# Patient Record
Sex: Female | Born: 1940 | Hispanic: No | Smoking: Former smoker
Health system: Southern US, Community
[De-identification: ages and names within clinical notes are randomized; demographics above are authoritative.]

## PROBLEM LIST (undated history)

## (undated) DIAGNOSIS — I10 Essential (primary) hypertension: Secondary | ICD-10-CM

## (undated) DIAGNOSIS — Z889 Allergy status to unspecified drugs, medicaments and biological substances status: Secondary | ICD-10-CM

## (undated) DIAGNOSIS — M1990 Unspecified osteoarthritis, unspecified site: Secondary | ICD-10-CM

## (undated) DIAGNOSIS — F419 Anxiety disorder, unspecified: Secondary | ICD-10-CM

## (undated) DIAGNOSIS — K219 Gastro-esophageal reflux disease without esophagitis: Secondary | ICD-10-CM

## (undated) DIAGNOSIS — I639 Cerebral infarction, unspecified: Secondary | ICD-10-CM

## (undated) HISTORY — PX: TONSILLECTOMY: SUR1361

## (undated) HISTORY — PX: ROTATOR CUFF REPAIR: SHX139

## (undated) HISTORY — PX: ABDOMINAL HYSTERECTOMY: SHX81

## (undated) HISTORY — PX: INTRAOCULAR LENS INSERTION: SHX110

## (undated) HISTORY — PX: JOINT REPLACEMENT: SHX530

## (undated) SURGERY — Surgical Case
Anesthesia: *Unknown

---

## 1998-02-17 ENCOUNTER — Other Ambulatory Visit: Admission: RE | Admit: 1998-02-17 | Discharge: 1998-02-17 | Payer: Self-pay | Admitting: Family Medicine

## 2000-03-07 ENCOUNTER — Ambulatory Visit (HOSPITAL_COMMUNITY): Admission: RE | Admit: 2000-03-07 | Discharge: 2000-03-07 | Payer: Self-pay | Admitting: Internal Medicine

## 2000-03-07 ENCOUNTER — Encounter: Payer: Self-pay | Admitting: Internal Medicine

## 2000-03-27 ENCOUNTER — Ambulatory Visit (HOSPITAL_COMMUNITY): Admission: RE | Admit: 2000-03-27 | Discharge: 2000-03-27 | Payer: Self-pay | Admitting: Gastroenterology

## 2000-03-27 ENCOUNTER — Encounter (INDEPENDENT_AMBULATORY_CARE_PROVIDER_SITE_OTHER): Payer: Self-pay

## 2000-04-17 ENCOUNTER — Encounter: Admission: RE | Admit: 2000-04-17 | Discharge: 2000-04-17 | Payer: Self-pay | Admitting: Gastroenterology

## 2000-04-17 ENCOUNTER — Encounter: Payer: Self-pay | Admitting: Gastroenterology

## 2002-08-08 ENCOUNTER — Encounter: Payer: Self-pay | Admitting: Internal Medicine

## 2002-08-08 ENCOUNTER — Ambulatory Visit (HOSPITAL_COMMUNITY): Admission: RE | Admit: 2002-08-08 | Discharge: 2002-08-08 | Payer: Self-pay | Admitting: Internal Medicine

## 2003-01-22 ENCOUNTER — Emergency Department (HOSPITAL_COMMUNITY): Admission: EM | Admit: 2003-01-22 | Discharge: 2003-01-22 | Payer: Self-pay | Admitting: Emergency Medicine

## 2003-01-22 ENCOUNTER — Encounter: Payer: Self-pay | Admitting: Emergency Medicine

## 2003-05-01 ENCOUNTER — Ambulatory Visit (HOSPITAL_COMMUNITY): Admission: RE | Admit: 2003-05-01 | Discharge: 2003-05-01 | Payer: Self-pay | Admitting: Gastroenterology

## 2003-05-01 ENCOUNTER — Encounter (INDEPENDENT_AMBULATORY_CARE_PROVIDER_SITE_OTHER): Payer: Self-pay | Admitting: Specialist

## 2003-09-30 ENCOUNTER — Emergency Department (HOSPITAL_COMMUNITY): Admission: EM | Admit: 2003-09-30 | Discharge: 2003-09-30 | Payer: Self-pay | Admitting: Emergency Medicine

## 2003-10-15 ENCOUNTER — Emergency Department (HOSPITAL_COMMUNITY): Admission: EM | Admit: 2003-10-15 | Discharge: 2003-10-16 | Payer: Self-pay | Admitting: Emergency Medicine

## 2004-05-11 ENCOUNTER — Encounter: Admission: RE | Admit: 2004-05-11 | Discharge: 2004-08-09 | Payer: Self-pay | Admitting: Orthopedic Surgery

## 2004-07-20 ENCOUNTER — Ambulatory Visit: Payer: Self-pay | Admitting: Family Medicine

## 2004-08-10 ENCOUNTER — Encounter: Admission: RE | Admit: 2004-08-10 | Discharge: 2004-11-08 | Payer: Self-pay | Admitting: Orthopedic Surgery

## 2004-09-28 ENCOUNTER — Ambulatory Visit: Payer: Self-pay | Admitting: Family Medicine

## 2004-11-04 ENCOUNTER — Ambulatory Visit: Payer: Self-pay | Admitting: Internal Medicine

## 2004-12-14 ENCOUNTER — Emergency Department (HOSPITAL_COMMUNITY): Admission: EM | Admit: 2004-12-14 | Discharge: 2004-12-14 | Payer: Self-pay | Admitting: Family Medicine

## 2004-12-17 ENCOUNTER — Ambulatory Visit: Payer: Self-pay | Admitting: Family Medicine

## 2005-02-07 ENCOUNTER — Encounter: Admission: RE | Admit: 2005-02-07 | Discharge: 2005-02-07 | Payer: Self-pay | Admitting: Gastroenterology

## 2005-02-21 ENCOUNTER — Encounter: Admission: RE | Admit: 2005-02-21 | Discharge: 2005-02-21 | Payer: Self-pay | Admitting: Gastroenterology

## 2005-04-07 ENCOUNTER — Ambulatory Visit: Payer: Self-pay | Admitting: Internal Medicine

## 2005-05-05 ENCOUNTER — Ambulatory Visit: Admission: RE | Admit: 2005-05-05 | Discharge: 2005-05-05 | Payer: Self-pay | Admitting: Gastroenterology

## 2005-05-24 ENCOUNTER — Encounter: Admission: RE | Admit: 2005-05-24 | Discharge: 2005-05-24 | Payer: Self-pay | Admitting: Gastroenterology

## 2005-09-07 ENCOUNTER — Ambulatory Visit: Payer: Self-pay | Admitting: Family Medicine

## 2005-09-08 ENCOUNTER — Ambulatory Visit: Payer: Self-pay | Admitting: Family Medicine

## 2005-11-02 ENCOUNTER — Inpatient Hospital Stay (HOSPITAL_COMMUNITY): Admission: AD | Admit: 2005-11-02 | Discharge: 2005-11-03 | Payer: Self-pay | Admitting: Internal Medicine

## 2005-12-06 ENCOUNTER — Inpatient Hospital Stay (HOSPITAL_COMMUNITY): Admission: EM | Admit: 2005-12-06 | Discharge: 2005-12-07 | Payer: Self-pay | Admitting: Internal Medicine

## 2006-06-20 ENCOUNTER — Ambulatory Visit (HOSPITAL_COMMUNITY): Admission: RE | Admit: 2006-06-20 | Discharge: 2006-06-20 | Payer: Self-pay | Admitting: Gastroenterology

## 2007-05-20 ENCOUNTER — Emergency Department (HOSPITAL_COMMUNITY): Admission: EM | Admit: 2007-05-20 | Discharge: 2007-05-20 | Payer: Self-pay | Admitting: Family Medicine

## 2007-06-11 ENCOUNTER — Encounter: Admission: RE | Admit: 2007-06-11 | Discharge: 2007-06-11 | Payer: Self-pay | Admitting: Sports Medicine

## 2008-05-02 ENCOUNTER — Encounter: Admission: RE | Admit: 2008-05-02 | Discharge: 2008-05-02 | Payer: Self-pay | Admitting: Gastroenterology

## 2008-10-31 ENCOUNTER — Ambulatory Visit (HOSPITAL_COMMUNITY): Admission: RE | Admit: 2008-10-31 | Discharge: 2008-10-31 | Payer: Self-pay | Admitting: Orthopedic Surgery

## 2009-12-02 ENCOUNTER — Encounter: Admission: RE | Admit: 2009-12-02 | Discharge: 2009-12-02 | Payer: Self-pay | Admitting: Gastroenterology

## 2010-04-04 ENCOUNTER — Inpatient Hospital Stay (HOSPITAL_COMMUNITY): Admission: EM | Admit: 2010-04-04 | Discharge: 2010-04-06 | Payer: Self-pay | Admitting: Emergency Medicine

## 2010-05-31 ENCOUNTER — Inpatient Hospital Stay (HOSPITAL_COMMUNITY): Admission: RE | Admit: 2010-05-31 | Discharge: 2010-06-03 | Payer: Self-pay | Admitting: Orthopedic Surgery

## 2010-10-10 ENCOUNTER — Encounter: Payer: Self-pay | Admitting: Internal Medicine

## 2010-10-11 ENCOUNTER — Encounter: Payer: Self-pay | Admitting: Gastroenterology

## 2010-12-02 LAB — CBC
HCT: 25.7 % — ABNORMAL LOW (ref 36.0–46.0)
HCT: 29.8 % — ABNORMAL LOW (ref 36.0–46.0)
HCT: 39.9 % (ref 36.0–46.0)
Hemoglobin: 8.6 g/dL — ABNORMAL LOW (ref 12.0–15.0)
Hemoglobin: 13.3 g/dL (ref 12.0–15.0)
MCH: 27.7 pg (ref 26.0–34.0)
MCV: 82.6 fL (ref 78.0–100.0)
MCV: 83.9 fL (ref 78.0–100.0)
Platelets: 167 10*3/uL (ref 150–400)
Platelets: 171 10*3/uL (ref 150–400)
Platelets: 234 10*3/uL (ref 150–400)
RBC: 3.11 MIL/uL — ABNORMAL LOW (ref 3.87–5.11)
RBC: 3.56 MIL/uL — ABNORMAL LOW (ref 3.87–5.11)
RBC: 3.84 MIL/uL — ABNORMAL LOW (ref 3.87–5.11)
RDW: 13.9 % (ref 11.5–15.5)
RDW: 13.9 % (ref 11.5–15.5)
WBC: 8.5 10*3/uL (ref 4.0–10.5)
WBC: 6.5 10*3/uL (ref 4.0–10.5)
WBC: 7.7 10*3/uL (ref 4.0–10.5)

## 2010-12-02 LAB — BASIC METABOLIC PANEL
BUN: 15 mg/dL (ref 6–23)
BUN: 12 mg/dL (ref 6–23)
BUN: 18 mg/dL (ref 6–23)
CO2: 29 mEq/L (ref 19–32)
Calcium: 8.8 mg/dL (ref 8.4–10.5)
Calcium: 8.6 mg/dL (ref 8.4–10.5)
Chloride: 106 mEq/L (ref 96–112)
Creatinine, Ser: 0.95 mg/dL (ref 0.4–1.2)
Creatinine, Ser: 0.75 mg/dL (ref 0.4–1.2)
GFR calc Af Amer: >60 mL/min (ref >60)
GFR calc Af Amer: >60 mL/min (ref >60)
GFR calc non Af Amer: >60 mL/min (ref >60)
GFR calc non Af Amer: >60 mL/min (ref >60)
Glucose, Bld: 119 mg/dL — ABNORMAL HIGH (ref 70–99)
Potassium: 4.5 mEq/L (ref 3.5–5.1)
Sodium: 132 mEq/L — ABNORMAL LOW (ref 135–145)

## 2010-12-02 LAB — DIFFERENTIAL
Basophils Relative: 0 % (ref 0–1)
Eosinophils Relative: 2 % (ref 0–5)
Lymphs Abs: 2 10*3/uL (ref 0.7–4.0)
Monocytes Absolute: 0.5 10*3/uL (ref 0.1–1.0)
Monocytes Relative: 10 % (ref 3–12)
Neutro Abs: 2.8 10*3/uL (ref 1.7–7.7)

## 2010-12-02 LAB — URINALYSIS, ROUTINE W REFLEX MICROSCOPIC
Bilirubin Urine: NEGATIVE
Glucose, UA: NEGATIVE mg/dL
Nitrite: NEGATIVE
Urobilinogen, UA: 1 mg/dL (ref 0.0–1.0)
pH: 6.5 (ref 5.0–8.0)

## 2010-12-02 LAB — COMPREHENSIVE METABOLIC PANEL
AST: 20 U/L (ref 0–37)
Albumin: 4 g/dL (ref 3.5–5.2)
Alkaline Phosphatase: 58 U/L (ref 39–117)
Calcium: 9.7 mg/dL (ref 8.4–10.5)
Chloride: 108 mEq/L (ref 96–112)
Sodium: 141 mEq/L (ref 135–145)
Total Bilirubin: 0.7 mg/dL (ref 0.3–1.2)

## 2010-12-02 LAB — URINE CULTURE

## 2010-12-02 LAB — PROTIME-INR
INR: 0.97 (ref 0.00–1.49)
Prothrombin Time: 13.1 seconds (ref 11.6–15.2)

## 2010-12-02 LAB — TYPE AND SCREEN
ABO/RH(D): O POS
Antibody Screen: NEGATIVE

## 2010-12-02 LAB — SURGICAL PCR SCREEN: MRSA, PCR: POSITIVE — AB

## 2010-12-04 LAB — COMPREHENSIVE METABOLIC PANEL
AST: 18 U/L (ref 0–37)
Albumin: 3.8 g/dL (ref 3.5–5.2)
Alkaline Phosphatase: 60 U/L (ref 39–117)
BUN: 26 mg/dL — ABNORMAL HIGH (ref 6–23)
BUN: 9 mg/dL (ref 6–23)
CO2: 26 mEq/L (ref 19–32)
Calcium: 9.1 mg/dL (ref 8.4–10.5)
Chloride: 109 mEq/L (ref 96–112)
Creatinine, Ser: 0.71 mg/dL (ref 0.4–1.2)
Creatinine, Ser: 0.78 mg/dL (ref 0.4–1.2)
GFR calc Af Amer: >60 mL/min (ref >60)
GFR calc non Af Amer: >60 mL/min (ref >60)
Glucose, Bld: 100 mg/dL — ABNORMAL HIGH (ref 70–99)
Glucose, Bld: 111 mg/dL — ABNORMAL HIGH (ref 70–99)
Sodium: 138 mEq/L (ref 135–145)
Total Bilirubin: 0.6 mg/dL (ref 0.3–1.2)
Total Protein: 6.9 g/dL (ref 6.0–8.3)

## 2010-12-04 LAB — GLUCOSE, CAPILLARY
Glucose-Capillary: 104 mg/dL — ABNORMAL HIGH (ref 70–99)
Glucose-Capillary: 108 mg/dL — ABNORMAL HIGH (ref 70–99)
Glucose-Capillary: 128 mg/dL — ABNORMAL HIGH (ref 70–99)
Glucose-Capillary: 82 mg/dL (ref 70–99)
Glucose-Capillary: 89 mg/dL (ref 70–99)
Glucose-Capillary: 89 mg/dL (ref 70–99)
Glucose-Capillary: 91 mg/dL (ref 70–99)

## 2010-12-04 LAB — CBC
HCT: 33.5 % — ABNORMAL LOW (ref 36.0–46.0)
HCT: 37.2 % (ref 36.0–46.0)
HCT: 37.5 % (ref 36.0–46.0)
HCT: 38.5 % (ref 36.0–46.0)
Hemoglobin: 10.9 g/dL — ABNORMAL LOW (ref 12.0–15.0)
Hemoglobin: 11.2 g/dL — ABNORMAL LOW (ref 12.0–15.0)
Hemoglobin: 12.4 g/dL (ref 12.0–15.0)
Hemoglobin: 12.6 g/dL (ref 12.0–15.0)
Hemoglobin: 12.8 g/dL (ref 12.0–15.0)
Hemoglobin: 13.1 g/dL (ref 12.0–15.0)
MCH: 28.3 pg (ref 26.0–34.0)
MCH: 28.4 pg (ref 26.0–34.0)
MCH: 28.9 pg (ref 26.0–34.0)
MCH: 29.1 pg (ref 26.0–34.0)
MCH: 29.1 pg (ref 26.0–34.0)
MCH: 29.2 pg (ref 26.0–34.0)
MCHC: 32.7 g/dL (ref 30.0–36.0)
MCHC: 32.8 g/dL (ref 30.0–36.0)
MCHC: 33.3 g/dL (ref 30.0–36.0)
MCHC: 33.4 g/dL (ref 30.0–36.0)
MCHC: 33.5 g/dL (ref 30.0–36.0)
MCV: 86.3 fL (ref 78.0–100.0)
MCV: 86.5 fL (ref 78.0–100.0)
MCV: 86.6 fL (ref 78.0–100.0)
MCV: 86.8 fL (ref 78.0–100.0)
MCV: 87.4 fL (ref 78.0–100.0)
Platelets: 183 K/uL (ref 150–400)
Platelets: 184 K/uL (ref 150–400)
RBC: 3.86 MIL/uL — ABNORMAL LOW (ref 3.87–5.11)
RBC: 3.94 MIL/uL (ref 3.87–5.11)
RBC: 4.3 MIL/uL (ref 3.87–5.11)
RBC: 4.33 MIL/uL (ref 3.87–5.11)
RBC: 4.38 MIL/uL (ref 3.87–5.11)
RBC: 4.49 MIL/uL (ref 3.87–5.11)
RDW: 13.9 % (ref 11.5–15.5)
RDW: 14 % (ref 11.5–15.5)
WBC: 7.1 K/uL (ref 4.0–10.5)
WBC: 7.6 K/uL (ref 4.0–10.5)

## 2010-12-04 LAB — BASIC METABOLIC PANEL
CO2: 25 mEq/L (ref 19–32)
Chloride: 109 mEq/L (ref 96–112)
GFR calc Af Amer: >60 mL/min (ref >60)
Glucose, Bld: 101 mg/dL — ABNORMAL HIGH (ref 70–99)
Sodium: 141 mEq/L (ref 135–145)

## 2010-12-04 LAB — TYPE AND SCREEN
ABO/RH(D): O POS
Antibody Screen: NEGATIVE

## 2010-12-04 LAB — APTT: aPTT: 29 seconds (ref 24–37)

## 2010-12-04 LAB — DIFFERENTIAL
Basophils Absolute: 0 K/uL (ref 0.0–0.1)
Basophils Relative: 1 % (ref 0–1)
Eosinophils Absolute: 0.1 K/uL (ref 0.0–0.7)
Eosinophils Relative: 2 % (ref 0–5)
Lymphocytes Relative: 23 % (ref 12–46)
Lymphs Abs: 1.7 K/uL (ref 0.7–4.0)
Monocytes Absolute: 0.6 K/uL (ref 0.1–1.0)
Monocytes Relative: 8 % (ref 3–12)
Neutro Abs: 4.7 K/uL (ref 1.7–7.7)
Neutrophils Relative %: 66 % (ref 43–77)

## 2010-12-04 LAB — PROTIME-INR
INR: 0.94 (ref 0.00–1.49)
Prothrombin Time: 12.5 s (ref 11.6–15.2)

## 2010-12-04 LAB — ABO/RH: ABO/RH(D): O POS

## 2010-12-04 LAB — CLOSTRIDIUM DIFFICILE EIA: C difficile Toxins A+B, EIA: NEGATIVE

## 2010-12-04 LAB — HEMOCCULT GUIAC POC 1CARD (OFFICE): Fecal Occult Bld: POSITIVE

## 2011-02-04 NOTE — Procedures (Signed)
[facility]  Patient:    Doris Pacheco, Doris Pacheco                         MRN: [id number] Proc. Date: 03/27/00 Attending:  Petra Kuba, M.D. CC:         Petra Kuba, M.D.             Crista Luria, M.D.                           Procedure Report  PROCEDURE PERFORMED:  Colonoscopy with hot biopsy.  ENDOSCOPIST:  Petra Kuba, M.D.  INDICATIONS FOR PROCEDURE:  Patient with family history of colon cancer and personal history of colon polyps, due for repeat colonic screening.  Consent was signed after risks, benefits, methods, and options were thoroughly discussed multiple times in the past.  MEDICATIONS USED:  Demerol 60 mg, Versed 6 mg.  DESCRIPTION OF PROCEDURE:  Rectal inspection was pertinent for external hemorrhoids.  Digital exam was negative.  The video colonoscope was inserted and fairly easily advanced around the colon to the cecum.  This did require some abdominal pressure but no position changes.  The cecum was identified by the appendiceal orifice and the ileocecal valve.  No obvious abnormality was seen on insertion.  In the cecum along the fold a 5 mm polyp was seen and was hot biopsied x 2.  An additional cold biopsy x 2 was obtained.  This was on a setting of 20/20.  The scope was slowly withdrawn.  In the ascending and sigmoid, two tiny polyps were seen, both were hot biopsied on a setting of 20/20 and put in a second separate container.  The one in the ascending was proximal and the one in the sigmoid was proximal as well in those locations. No other abnormalities were seen as we slowly withdrew back through the colon. Once back in the rectum, the scope was retroflexed revealing some internal hemorrhoids, small.  The scope was straightened, air was suctioned, scope removed.  The patient tolerated the procedure well.  There was no obvious immediate complication.  ENDOSCOPIC DIAGNOSIS: 1. Internal and external hemorrhoids. 2. Two tiny to  small proximal sigmoid and proximal ascending polyps, both    hot biopsied, put in the second container. 3. Cecal 5 mm polyp, hot biopsied x 2, cold x 2, put in the first container. 4. Otherwise, within normal limits.  PLAN:  Await pathology to determine future colonic screening.  Otherwise, be happy to see back p.r.n. and return care to [facility] for the customary yearly rectals and guaiacs and other health care maintenance. DD:  03/27/00 TD:  03/27/00 Job: [id number]

## 2011-02-04 NOTE — Discharge Summary (Signed)
Doris Pacheco, Doris Pacheco                 ACCOUNT NO.:  [id number]   MEDICAL RECORD NO.:  [id number]          PATIENT TYPE:  INP   LOCATION:  6713                         FACILITY:  [facility]   PHYSICIAN:  Della Goo, M.D. DATE OF BIRTH:  [birth year]   DATE OF ADMISSION:  11/02/2005  DATE OF DISCHARGE:  11/03/2005                                 DISCHARGE SUMMARY   FINAL DIAGNOSES:  1.  Hyperamylasemia (elevated amylase level).  2.  Hiatal hernia/epigastric pain.  3.  Gastroesophageal reflux disease.  4.  Hypertension.  5.  Hyperlipidemia.  6.  Lumbar disk disease, compressed lumbar disk at L4 and L5, history.   HOSPITAL COURSE:  A 70 year old female who was referred for admission  secondary to complaints of epigastric abdominal pain extending across the  abdomen, radiating toward the back, and found on examination to have mild  epigastric tenderness without rebound or guarding.  The patient also had no  complaints of nausea, vomiting or bowel changes.  Outpatient labs had been  ordered, results of which returned on November 02, 2005, with  critical/abnormal values of amylase at a level of 514.  The patient was then  referred for direct admit and further evaluation.   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  There were  no findings of acute pancreatitis.  The gallbladder appeared normal at that  time.  CT scan results of the pelvis revealed findings of a hysterectomy,  surgical changes and a small simple-appearing left ovarian cyst.  On  admission, the patient had an amylase level of 514 and a lipase level of 16.  The patient was treated with IV fluids for rehydration therapy, proton pump  inhibitor therapy IV.  Repeat of laboratory studies the next a.m. revealed  an amylase level which had decreased to 351.   Otherwise, her labs revealed  mild anemia with a hemoglobin of 11.6 but a normocytic, normochromic MCV  level of 82.9.  The patient was without symptoms and was discharged to home  after our CT scan results were reviewed.  She was to follow up in the office  in one week for further studies and differentiation of her amylase levels.  The patient resumed her previous medications of Diovan, Maxzide, Lipitor,  Premarin, vitamin C, vitamin E, and Nexium.           ______________________________  Della Goo, M.D.     HJ/MEDQ  D:  12/06/2005  T:  12/08/2005  Job:  [id number]

## 2011-02-04 NOTE — Op Note (Signed)
NAME:  Doris Pacheco, Doris Pacheco                           ACCOUNT NO.:  [id number]   MEDICAL RECORD NO.:  [id number]                   PATIENT TYPE:  AMB   LOCATION:  ENDO                                 FACILITY:  [facility]   PHYSICIAN:  Petra Kuba, M.D.                 DATE OF BIRTH:  12-16-1940   DATE OF PROCEDURE:  05/01/2003  DATE OF DISCHARGE:                                 OPERATIVE REPORT   PROCEDURE:  Colonoscopy with polypectomy.   INDICATION:  Patient with a history of colon polyps, due for repeat  screening.  Consent was signed after risks, benefits, methods, options  thoroughly discussed multiple times in the past.   MEDICINES USED:  1. Demerol 60.  2. Versed 6.   DESCRIPTION OF PROCEDURE:  Rectal inspection was pertinent for small  external hemorrhoids.  Digital exam was negative.  The video pediatric  adjustable colonoscope was inserted, fairly easily advanced around the colon  to the cecum, did require some abdominal pressure.  No abnormality was seen  on insertion.  The cecum was identified by the appendiceal orifice and the  ileocecal valve.  The scope was slowly withdrawn.  The prep was adequate.  There was some liquid stool that required washing and suctioning.  In the  more proximal ascending one fold away from the ileocecal valve, was a tiny  polyp with was hot biopsied x 1 at a setting of 20-20.  In reevaluating her  cecum, possibly there was some residual polyp from the previous polyp seen  and on a setting of 15-15, one hot biopsy was done, and that was put in the  second container.  On slow withdrawal, two other tiny to small ascending and  hepatic flexure polyps were seen and were hot biopsied and put in the first  container.  The scope was further withdrawn.  No other transverse or left-  sided polyps were seen.  Anorectal pull-through and retroflexion confirmed  some small hemorrhoids.  The scope reinserted a short ways up the left of  the colon; air was  suctioned, scope removed.  The patient tolerated the  procedure well.  There was no obvious immediate complication.   ENDOSCOPIC DIAGNOSES:  1. Internal-external hemorrhoids.  2. Multiple small to tiny right-sided polyps, hot biopsied with one of the     cecum put in a separate container and done on a setting 15-15.  The other     was on a setting of 20-20.  3. Otherwise within normal limits to the cecum.   PLAN:  1. Await pathology to determine future colonic screening.  2. Happy to see back p.r.n.  3. Otherwise, return care to Dr. Margarette Canada and [facility] for the customary     health care maintenance to include yearly rectals and guaiacs.  Petra Kuba, M.D.   MEM/MEDQ  D:  05/01/2003  T:  05/01/2003  Job:  [id number]   cc:   Dr. Margarette Canada

## 2011-02-04 NOTE — Op Note (Signed)
Doris Pacheco, Doris Pacheco                 ACCOUNT NO.:  [id number]   MEDICAL RECORD NO.:  [id number]          PATIENT TYPE:  AMB   LOCATION:  DFTL                         FACILITY:  [facility]   PHYSICIAN:  Petra Kuba, M.D.    DATE OF BIRTH:  1941-04-24   DATE OF PROCEDURE:  05/05/2005  DATE OF DISCHARGE:                                 OPERATIVE REPORT   PROCEDURE:  Esophagogastroduodenoscopy.   ENDOSCOPIST:  Petra Kuba, M.D.   INDICATIONS:  Abdominal pain, abnormal CAT scan, reflux.   Consent was signed after risks, benefits, methods, and options were  thoroughly discussed in the office.   MEDICINES USED:  Demerol 60, Versed 5.   DESCRIPTION OF PROCEDURE:  First, the video endoscope was inserted by direct  vision. The esophagus was normal.  In the distal esophagus was a tiny small  hiatal hernia. The scope passed into the stomach. advanced to the antrum,  pertinent for some minimal antritis, advanced through a normal pylorus into  a normal duodenal bulb and around the C-loop to a normal second portion of  the duodenum. We thought we got a quick look at the ampulla which appeared  normal. The scope was slowly withdrawn back to the bulb.  A good look there  confirmed its normal appearance.  The scope was withdrawn back to the  stomach and retroflexed.  High in the cardia, the hiatal hernia was  confirmed.  The fundus, angularis, lesser and greater curve were all normal  on retroflexed visualization except for a minimal amount of gastritis.  Straight visualization of the stomach did not reveal any additional  findings. The scope was then slowly withdrawn. Again, a good look at the  esophagus was normal. The scope was removed, and we went ahead and inserted  the side-viewing diagnostic ERCP scope which was easily advanced into the  stomach, through the antrum, and into the duodenum. We thought we got a  quick look at the minor ampulla which appeared normal and a good look at the  major  papilla was normal. Photo documentation was obtained. The scope was  then withdrawn. The patient tolerated the procedure well. There was no  obvious immediate complication.   ENDOSCOPIC DIAGNOSES:  1.  Tiny small hiatal hernia.  2.  Minimal gastritis/antritis.  3.  Otherwise normal esophagogastroduodenoscopy with good look at the major      papilla with the side-viewing scope. I believe we may have seen the      minor papilla as well.   PLAN:  1.  Continue pump inhibitors.  2.  Follow up p.r.n.  3.  Recheck MRI in September to make sure no changes and decide followup at      that juncture.  4.  Continue to deal with the insurance company over her Nexium.  We will      see what pump inhibitors they cover.           ______________________________  Petra Kuba, M.D.     MEM/MEDQ  D:  05/05/2005  T:  05/05/2005  Job:  [id number]  cc:   Tresa Endo L. Philipp Deputy, M.D.  [phone] [address]  [place] [address]  Fax: [phone]

## 2012-06-18 ENCOUNTER — Other Ambulatory Visit: Payer: Self-pay | Admitting: Gastroenterology

## 2012-10-18 ENCOUNTER — Encounter (HOSPITAL_COMMUNITY): Payer: Self-pay | Admitting: Pharmacy Technician

## 2012-10-18 ENCOUNTER — Other Ambulatory Visit: Payer: Self-pay | Admitting: Orthopedic Surgery

## 2012-10-18 NOTE — H&P (Signed)
TOTAL KNEE REVISION ADMISSION H&P  Patient is being admitted for right revision total knee arthroplasty.  Subjective:  Chief Complaint:right knee pain.  HPI: Doris Pacheco, 72 y.o. female, has a history of pain and functional disability in the right knee(s) due to failed previous arthroplasty and patient has failed non-surgical conservative treatments for greater than 12 weeks to include NSAID's and/or analgesics and activity modification. The indications for the revision of the total knee arthroplasty are fracture or mechanical failure of one or components. Onset of symptoms was abrupt starting 1 years ago with gradually worsening course since that time.  Prior procedures on the right knee(s) include arthroplasty.  Patient currently rates pain in the right knee(s) at 7 out of 10 with activity. There is worsening of pain with activity and weight bearing and pain that interferes with activities of daily living.  Patient has evidence of periprosthetic patella fracture by imaging studies. This condition presents safety issues increasing the risk of falls.  There is no current active infection.  There are no active problems to display for this patient.  No past medical history on file.  No past surgical history on file.  No prescriptions prior to admission   Allergies not on file  History  Substance Use Topics  . Smoking status: Not on file  . Smokeless tobacco: Not on file  . Alcohol Use: Not on file    No family history on file.    Review of Systems  Constitutional: Negative.   HENT: Negative.   Eyes: Negative.   Respiratory: Negative.   Cardiovascular: Negative.   Gastrointestinal: Negative.   Genitourinary: Negative.   Musculoskeletal: Positive for joint pain.  Skin: Negative.   Neurological: Negative.   Endo/Heme/Allergies: Bruises/bleeds easily.  Psychiatric/Behavioral: Negative.      Objective:  Physical Exam  Constitutional: She is oriented to person, place, and time.  She appears well-developed and well-nourished.  HENT:  Head: Normocephalic.  Eyes: Pupils are equal, round, and reactive to light.  Cardiovascular: Normal heart sounds.   Respiratory: Breath sounds normal.  GI: Bowel sounds are normal.  Musculoskeletal:       Right knee: tenderness found. Patellar tendon tenderness noted.  Neurological: She is alert and oriented to person, place, and time.  Psychiatric: Her behavior is normal.    Vital signs in last 24 hours:    Labs:  There is no height or weight on file to calculate BMI.  Imaging Review Plain radiographs of the right knee demonstrate overall alignment is neutral.There is evidence of loosening of the patellar component. The bone quality appears to be adequate for age and reported activity level.  Assessment/Plan:  End stage arthritis, right knee(s) with failed previous arthroplasty.   The patient history, physical examination, clinical judgment of the provider and imaging studies are consistent with end stage degenerative joint disease of the right knee(s), previous total knee arthroplasty. Revision total knee arthroplasty is deemed medically necessary. The treatment options including medical management, injection therapy, arthroscopy and revision arthroplasty were discussed at length. The risks and benefits of revision total knee arthroplasty were presented and reviewed. The risks due to aseptic loosening, infection, stiffness, patella tracking problems, thromboembolic complications and other imponderables were discussed. The patient acknowledged the explanation, agreed to proceed with the plan and consent was signed. Patient is being admitted for inpatient treatment for surgery, pain control, PT, OT, prophylactic antibiotics, VTE prophylaxis, progressive ambulation and ADL's and discharge planning.The patient is planning to be discharged home with home health  services

## 2012-10-23 ENCOUNTER — Encounter (HOSPITAL_COMMUNITY)
Admission: RE | Admit: 2012-10-23 | Discharge: 2012-10-23 | Disposition: A | Discharge status: Home / Self Care | Payer: Medicare Other | Source: Ambulatory Visit | Attending: Orthopedic Surgery | Admitting: Orthopedic Surgery

## 2012-10-23 ENCOUNTER — Ambulatory Visit (HOSPITAL_COMMUNITY)
Admission: RE | Admit: 2012-10-23 | Discharge: 2012-10-23 | Disposition: A | Discharge status: Home / Self Care | Payer: Medicare Other | Source: Ambulatory Visit | Attending: Orthopedic Surgery | Admitting: Orthopedic Surgery

## 2012-10-23 ENCOUNTER — Encounter (HOSPITAL_COMMUNITY): Payer: Self-pay

## 2012-10-23 HISTORY — DX: Essential (primary) hypertension: I10

## 2012-10-23 HISTORY — DX: Unspecified osteoarthritis, unspecified site: M19.90

## 2012-10-23 HISTORY — DX: Cerebral infarction, unspecified: I63.9

## 2012-10-23 HISTORY — DX: Allergy status to unspecified drugs, medicaments and biological substances: Z88.9

## 2012-10-23 HISTORY — DX: Gastro-esophageal reflux disease without esophagitis: K21.9

## 2012-10-23 HISTORY — DX: Anxiety disorder, unspecified: F41.9

## 2012-10-23 LAB — CBC WITH DIFFERENTIAL/PLATELET
Eosinophils Absolute: 0.1 10*3/uL (ref 0.0–0.7)
Eosinophils Relative: 1 % (ref 0–5)
Hemoglobin: 13.1 g/dL (ref 12.0–15.0)
Lymphs Abs: 1.4 10*3/uL (ref 0.7–4.0)
MCH: 28.1 pg (ref 26.0–34.0)
MCV: 84.8 fL (ref 78.0–100.0)
Monocytes Relative: 10 % (ref 3–12)
Neutrophils Relative %: 66 % (ref 43–77)
RBC: 4.66 MIL/uL (ref 3.87–5.11)

## 2012-10-23 LAB — BASIC METABOLIC PANEL
CO2: 26 mEq/L (ref 19–32)
Chloride: 103 mEq/L (ref 96–112)
Glucose, Bld: 88 mg/dL (ref 70–99)
Potassium: 3.7 mEq/L (ref 3.5–5.1)
Sodium: 139 mEq/L (ref 135–145)

## 2012-10-23 LAB — URINALYSIS, ROUTINE W REFLEX MICROSCOPIC
Ketones, ur: NEGATIVE mg/dL
Leukocytes, UA: NEGATIVE
Nitrite: NEGATIVE
Protein, ur: NEGATIVE mg/dL
pH: 5.5 (ref 5.0–8.0)

## 2012-10-23 LAB — TYPE AND SCREEN

## 2012-10-23 LAB — PROTIME-INR: INR: 0.94 (ref 0.00–1.49)

## 2012-10-23 LAB — URINE MICROSCOPIC-ADD ON

## 2012-10-23 NOTE — Progress Notes (Signed)
Primary Physician - Dr. Lovell Sheehan, [place] Cardiologist - dr. Katrinka [facility]) Stress test several years ago, will request records No other cardiac testing.

## 2012-10-23 NOTE — Pre-Procedure Instructions (Signed)
FAWNA CRANMER  10/23/2012   Your procedure is scheduled on:  Friday, February 7th  Report to [facility] at 0530 AM.  Call this number if you have problems the morning of surgery: [phone]   Remember:   Do not eat food or drink liquids after midnight.    Take these medicines the morning of surgery with A SIP OF WATER: nexium, lorcet if needed   Do not wear jewelry, make-up or nail polish.  Do not wear lotions, powders, or perfumes. You may not wear deodorant.  Do not shave 48 hours prior to surgery. Men may shave face and neck.  Do not bring valuables to the hospital.  Contacts, dentures or bridgework may not be worn into surgery.  Leave suitcase in the car. After surgery it may be brought to your room.  For patients admitted to the hospital, checkout time is 11:00 AM the day of discharge.   Patients discharged the day of surgery will not be allowed to drive home.    Special Instructions: Shower using CHG 2 nights before surgery and the night before surgery.  If you shower the day of surgery use CHG.  Use special wash - you have one bottle of CHG for all showers.  You should use approximately 1/3 of the bottle for each shower.   Please read over the following fact sheets that you were given: Pain Booklet, Coughing and Deep Breathing, Blood Transfusion Information, MRSA Information and Surgical Site Infection Prevention

## 2012-10-25 MED ORDER — CEFAZOLIN SODIUM-DEXTROSE 2-3 GM-% IV SOLR
2.0000 g | INTRAVENOUS | Status: AC
  Administered 2012-10-26: 2 g via INTRAVENOUS
  Filled 2012-10-25: qty 50

## 2012-10-25 NOTE — H&P (Signed)
TOTAL KNEE REVISION ADMISSION H&P  Patient is being admitted for right revision total knee arthroplasty.  Subjective:  Chief Complaint:right knee pain.  HPI: Doris Pacheco, 72 y.o. female, has a history of pain and functional disability in the right knee(s) due to arthritis and patient has failed non-surgical conservative treatments for greater than 12 weeks to include NSAID's and/or analgesics and use of assistive devices. The indications for the revision of the total knee arthroplasty are loosening of one or more components and treatment of periprosthectic fracture of distal femur, proximal tibia or patella. Onset of symptoms was abrupt starting 1 years ago with gradually worsening course since that time.  Prior procedures on the right knee(s) include arthroplasty.  Patient currently rates pain in the right knee(s) at 7 out of 10 with activity. There is worsening of pain with activity and weight bearing and pain that interferes with activities of daily living.  Patient has evidence of prosthetic loosening and patella fracture by imaging studies. This condition presents safety issues increasing the risk of falls.  There is no current active infection.  There are no active problems to display for this patient.  Past Medical History  Diagnosis Date  . Hypertension     takes meds daily  . Anxiety   . History of seasonal allergies   . Stroke     tia  . GERD (gastroesophageal reflux disease)   . Arthritis     Past Surgical History  Procedure Date  . Joint replacement     left hip, right knee  . Rotator cuff repair     bilateral  . Tonsillectomy   . Abdominal hysterectomy   . Intraocular lens insertion     right eye    No prescriptions prior to admission   Allergies  Allergen Reactions  . Tape Rash    History  Substance Use Topics  . Smoking status: Never Smoker   . Smokeless tobacco: Not on file  . Alcohol Use: No    No family history on file.    Review of Systems   Constitutional: Negative.   HENT: Negative.   Eyes: Negative.   Respiratory: Negative.   Cardiovascular: Negative.   Gastrointestinal: Negative.   Genitourinary: Negative.   Musculoskeletal: Positive for joint pain and falls.  Skin: Negative.   Neurological: Negative.   Endo/Heme/Allergies: Bruises/bleeds easily.  Psychiatric/Behavioral: Negative.      Objective:  Physical Exam  Constitutional: She is oriented to person, place, and time. She appears well-developed and well-nourished.  Eyes: Pupils are equal, round, and reactive to light.  Cardiovascular: Normal heart sounds.   Respiratory: Breath sounds normal.  GI: Bowel sounds are normal.  Musculoskeletal:       Right knee: tenderness found.  Neurological: She is alert and oriented to person, place, and time.    Vital signs in last 24 hours:    Labs:  There is no height or weight on file to calculate BMI.  Imaging Review Plain radiographs of the right knee(s). The overall alignment is neutral.There is evidence of loosening of the patellar components. The bone quality appears to be adequate for age and reported activity level. There is a comminuted periprosthetic fracture with diastasis.  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 Revision total knee arthroplasty  is deemed medically necessary. The treatment options including medical management, injection therapy, arthroscopy and revision arthroplasty were discussed at length. The risks and benefits of revision total knee arthroplasty were presented and reviewed. The risks due to aseptic loosening, infection, stiffness, patella tracking problems, thromboembolic complications and other  imponderables were discussed. The patient acknowledged the explanation, agreed to proceed with the plan and consent was signed. Patient is being admitted for inpatient treatment for surgery, pain control, PT, OT, prophylactic antibiotics, VTE prophylaxis, progressive ambulation and ADL's and discharge planning.The patient is planning to be discharged home with home health services

## 2012-10-26 ENCOUNTER — Encounter (HOSPITAL_COMMUNITY): Payer: Self-pay | Admitting: Anesthesiology

## 2012-10-26 ENCOUNTER — Encounter (HOSPITAL_COMMUNITY): Payer: Self-pay | Admitting: Orthopedic Surgery

## 2012-10-26 ENCOUNTER — Encounter (HOSPITAL_COMMUNITY)
Admission: RE | Disposition: A | Discharge status: Home Health | Payer: Self-pay | Source: Ambulatory Visit | Attending: Orthopedic Surgery

## 2012-10-26 ENCOUNTER — Inpatient Hospital Stay (HOSPITAL_COMMUNITY)
Admission: RE | Admit: 2012-10-26 | Discharge: 2012-10-28 | DRG: 488 | Disposition: A | Discharge status: Home Health | Payer: Medicare Other | Source: Ambulatory Visit | Attending: Orthopedic Surgery | Admitting: Orthopedic Surgery

## 2012-10-26 ENCOUNTER — Inpatient Hospital Stay (HOSPITAL_COMMUNITY): Payer: Medicare Other

## 2012-10-26 ENCOUNTER — Inpatient Hospital Stay (HOSPITAL_COMMUNITY): Payer: Medicare Other | Admitting: Anesthesiology

## 2012-10-26 DIAGNOSIS — S82009A Unspecified fracture of unspecified patella, initial encounter for closed fracture: Secondary | ICD-10-CM | POA: Diagnosis present

## 2012-10-26 DIAGNOSIS — F411 Generalized anxiety disorder: Secondary | ICD-10-CM | POA: Diagnosis present

## 2012-10-26 DIAGNOSIS — Z8673 Personal history of transient ischemic attack (TIA), and cerebral infarction without residual deficits: Secondary | ICD-10-CM

## 2012-10-26 DIAGNOSIS — T84038A Mechanical loosening of other internal prosthetic joint, initial encounter: Secondary | ICD-10-CM

## 2012-10-26 DIAGNOSIS — Z01818 Encounter for other preprocedural examination: Secondary | ICD-10-CM

## 2012-10-26 DIAGNOSIS — Z01812 Encounter for preprocedural laboratory examination: Secondary | ICD-10-CM

## 2012-10-26 DIAGNOSIS — I10 Essential (primary) hypertension: Secondary | ICD-10-CM | POA: Diagnosis present

## 2012-10-26 DIAGNOSIS — T84039A Mechanical loosening of unspecified internal prosthetic joint, initial encounter: Principal | ICD-10-CM | POA: Diagnosis present

## 2012-10-26 DIAGNOSIS — K219 Gastro-esophageal reflux disease without esophagitis: Secondary | ICD-10-CM | POA: Diagnosis present

## 2012-10-26 DIAGNOSIS — Y831 Surgical operation with implant of artificial internal device as the cause of abnormal reaction of the patient, or of later complication, without mention of misadventure at the time of the procedure: Secondary | ICD-10-CM | POA: Diagnosis present

## 2012-10-26 DIAGNOSIS — T84069A Wear of articular bearing surface of unspecified internal prosthetic joint, initial encounter: Secondary | ICD-10-CM | POA: Diagnosis present

## 2012-10-26 DIAGNOSIS — Z96659 Presence of unspecified artificial knee joint: Secondary | ICD-10-CM

## 2012-10-26 DIAGNOSIS — W19XXXA Unspecified fall, initial encounter: Secondary | ICD-10-CM | POA: Diagnosis present

## 2012-10-26 DIAGNOSIS — Y9289 Other specified places as the place of occurrence of the external cause: Secondary | ICD-10-CM

## 2012-10-26 DIAGNOSIS — M129 Arthropathy, unspecified: Secondary | ICD-10-CM | POA: Diagnosis present

## 2012-10-26 DIAGNOSIS — Z01810 Encounter for preprocedural cardiovascular examination: Secondary | ICD-10-CM

## 2012-10-26 HISTORY — PX: PATELLECTOMY: SHX1022

## 2012-10-26 HISTORY — PX: I & D KNEE WITH POLY EXCHANGE: SHX5024

## 2012-10-26 SURGERY — PATELLECTOMY
Anesthesia: Regional | Site: Knee | Laterality: Right | Wound class: Clean

## 2012-10-26 MED ORDER — HYDROMORPHONE HCL PF 1 MG/ML IJ SOLN
0.2500 mg | INTRAMUSCULAR | Status: DC | PRN
  Administered 2012-10-26 (×2): 0.5 mg via INTRAVENOUS

## 2012-10-26 MED ORDER — ONDANSETRON HCL 4 MG/2ML IJ SOLN
INTRAMUSCULAR | Status: DC | PRN
  Administered 2012-10-26: 4 mg via INTRAVENOUS

## 2012-10-26 MED ORDER — HYDROMORPHONE HCL PF 1 MG/ML IJ SOLN
1.0000 mg | INTRAMUSCULAR | Status: DC | PRN
  Administered 2012-10-27 – 2012-10-28 (×5): 1 mg via INTRAVENOUS
  Filled 2012-10-26 (×5): qty 1

## 2012-10-26 MED ORDER — FENTANYL CITRATE 0.05 MG/ML IJ SOLN
50.0000 ug | INTRAMUSCULAR | Status: DC | PRN
  Administered 2012-10-26 (×2): 50 ug via INTRAVENOUS

## 2012-10-26 MED ORDER — FENTANYL CITRATE 0.05 MG/ML IJ SOLN
INTRAMUSCULAR | Status: DC | PRN
  Administered 2012-10-26: 25 ug via INTRAVENOUS
  Administered 2012-10-26: 50 ug via INTRAVENOUS
  Administered 2012-10-26: 25 ug via INTRAVENOUS

## 2012-10-26 MED ORDER — EPHEDRINE SULFATE 50 MG/ML IJ SOLN
INTRAMUSCULAR | Status: DC | PRN
  Administered 2012-10-26 (×2): 10 mg via INTRAVENOUS

## 2012-10-26 MED ORDER — ACETAMINOPHEN 10 MG/ML IV SOLN: 1000.0000 mg | Freq: Once | INTRAVENOUS | Status: DC

## 2012-10-26 MED ORDER — KCL IN DEXTROSE-NACL 20-5-0.45 MEQ/L-%-% IV SOLN
INTRAVENOUS | Status: DC
  Administered 2012-10-26: 125 mL/h via INTRAVENOUS
  Filled 2012-10-26 (×9): qty 1000

## 2012-10-26 MED ORDER — MIDAZOLAM HCL 2 MG/2ML IJ SOLN: 1.0000 mg | INTRAMUSCULAR | Status: DC | PRN

## 2012-10-26 MED ORDER — ROPIVACAINE HCL 5 MG/ML IJ SOLN
INTRAMUSCULAR | Status: DC | PRN
  Administered 2012-10-26: 30 mL

## 2012-10-26 MED ORDER — METHOCARBAMOL 500 MG PO TABS
500.0000 mg | ORAL_TABLET | Freq: Four times a day (QID) | ORAL | Status: DC | PRN
  Administered 2012-10-27 – 2012-10-28 (×5): 500 mg via ORAL
  Filled 2012-10-26 (×5): qty 1

## 2012-10-26 MED ORDER — ACETAMINOPHEN 10 MG/ML IV SOLN
1000.0000 mg | Freq: Four times a day (QID) | INTRAVENOUS | Status: AC
  Administered 2012-10-26 (×3): 1000 mg via INTRAVENOUS
  Filled 2012-10-26 (×4): qty 100

## 2012-10-26 MED ORDER — PROMETHAZINE HCL 25 MG/ML IJ SOLN: 6.2500 mg | INTRAMUSCULAR | Status: DC | PRN

## 2012-10-26 MED ORDER — ACETAMINOPHEN 10 MG/ML IV SOLN
INTRAVENOUS | Status: AC
  Administered 2012-10-26: 1000 mg via INTRAVENOUS
  Filled 2012-10-26: qty 100

## 2012-10-26 MED ORDER — ACETAMINOPHEN 650 MG RE SUPP: 650.0000 mg | Freq: Four times a day (QID) | RECTAL | Status: DC | PRN

## 2012-10-26 MED ORDER — SODIUM CHLORIDE 0.9 % IR SOLN
Status: DC | PRN
  Administered 2012-10-26: 1000 mL

## 2012-10-26 MED ORDER — HYDROMORPHONE HCL PF 1 MG/ML IJ SOLN
INTRAMUSCULAR | Status: AC
  Filled 2012-10-26: qty 2

## 2012-10-26 MED ORDER — OXYCODONE HCL 5 MG PO TABS
5.0000 mg | ORAL_TABLET | ORAL | Status: DC | PRN
  Administered 2012-10-26 – 2012-10-28 (×8): 10 mg via ORAL
  Filled 2012-10-26 (×8): qty 2

## 2012-10-26 MED ORDER — URIBEL 118 MG PO CAPS
1.0000 | ORAL_CAPSULE | Freq: Every day | ORAL | Status: DC
  Administered 2012-10-26 – 2012-10-28 (×3): 118 mg via ORAL
  Filled 2012-10-26 (×3): qty 1

## 2012-10-26 MED ORDER — METOCLOPRAMIDE HCL 5 MG/ML IJ SOLN: 5.0000 mg | Freq: Three times a day (TID) | INTRAMUSCULAR | Status: DC | PRN

## 2012-10-26 MED ORDER — MONTELUKAST SODIUM 10 MG PO TABS
10.0000 mg | ORAL_TABLET | Freq: Every day | ORAL | Status: DC
  Administered 2012-10-26 – 2012-10-27 (×2): 10 mg via ORAL
  Filled 2012-10-26 (×4): qty 1

## 2012-10-26 MED ORDER — ONDANSETRON HCL 4 MG/2ML IJ SOLN: 4.0000 mg | Freq: Four times a day (QID) | INTRAMUSCULAR | Status: DC | PRN

## 2012-10-26 MED ORDER — OXYCODONE HCL 5 MG/5ML PO SOLN: 5.0000 mg | Freq: Once | ORAL | Status: DC | PRN

## 2012-10-26 MED ORDER — OXYCODONE-ACETAMINOPHEN 5-325 MG PO TABS: 1.0000 | ORAL_TABLET | ORAL | Status: DC | PRN

## 2012-10-26 MED ORDER — BISACODYL 5 MG PO TBEC: 5.0000 mg | DELAYED_RELEASE_TABLET | Freq: Every day | ORAL | Status: DC | PRN

## 2012-10-26 MED ORDER — PHENOL 1.4 % MT LIQD: 1.0000 | OROMUCOSAL | Status: DC | PRN

## 2012-10-26 MED ORDER — LIDOCAINE HCL (CARDIAC) 20 MG/ML IV SOLN
INTRAVENOUS | Status: DC | PRN
  Administered 2012-10-26: 40 mg via INTRAVENOUS

## 2012-10-26 MED ORDER — FLEET ENEMA 7-19 GM/118ML RE ENEM: 1.0000 | ENEMA | Freq: Once | RECTAL | Status: AC | PRN

## 2012-10-26 MED ORDER — METHOCARBAMOL 100 MG/ML IJ SOLN
500.0000 mg | Freq: Four times a day (QID) | INTRAVENOUS | Status: DC | PRN
  Filled 2012-10-26: qty 5

## 2012-10-26 MED ORDER — OXYCODONE HCL 5 MG PO TABS: 5.0000 mg | ORAL_TABLET | Freq: Once | ORAL | Status: DC | PRN

## 2012-10-26 MED ORDER — FENTANYL CITRATE 0.05 MG/ML IJ SOLN
INTRAMUSCULAR | Status: AC
  Filled 2012-10-26: qty 4

## 2012-10-26 MED ORDER — CELECOXIB 200 MG PO CAPS
200.0000 mg | ORAL_CAPSULE | Freq: Two times a day (BID) | ORAL | Status: DC
  Administered 2012-10-26 – 2012-10-28 (×4): 200 mg via ORAL
  Filled 2012-10-26 (×7): qty 1

## 2012-10-26 MED ORDER — ASPIRIN EC 325 MG PO TBEC
325.0000 mg | DELAYED_RELEASE_TABLET | Freq: Two times a day (BID) | ORAL | Status: DC
  Administered 2012-10-26 – 2012-10-28 (×4): 325 mg via ORAL
  Filled 2012-10-26 (×7): qty 1

## 2012-10-26 MED ORDER — MIDAZOLAM HCL 5 MG/5ML IJ SOLN
INTRAMUSCULAR | Status: DC | PRN
  Administered 2012-10-26: 2 mg via INTRAVENOUS

## 2012-10-26 MED ORDER — MAGNESIUM HYDROXIDE 400 MG/5ML PO SUSP: 30.0000 mL | Freq: Every day | ORAL | Status: DC | PRN

## 2012-10-26 MED ORDER — METOCLOPRAMIDE HCL 10 MG PO TABS: 5.0000 mg | ORAL_TABLET | Freq: Three times a day (TID) | ORAL | Status: DC | PRN

## 2012-10-26 MED ORDER — PHENYLEPHRINE HCL 10 MG/ML IJ SOLN
INTRAMUSCULAR | Status: DC | PRN
  Administered 2012-10-26 (×2): 80 ug via INTRAVENOUS
  Administered 2012-10-26: 40 ug via INTRAVENOUS
  Administered 2012-10-26: 80 ug via INTRAVENOUS

## 2012-10-26 MED ORDER — LACTATED RINGERS IV SOLN
INTRAVENOUS | Status: DC | PRN
  Administered 2012-10-26 (×2): via INTRAVENOUS

## 2012-10-26 MED ORDER — ASPIRIN 325 MG PO TBEC: 325.0000 mg | DELAYED_RELEASE_TABLET | Freq: Two times a day (BID) | ORAL | Status: DC

## 2012-10-26 MED ORDER — ALUM & MAG HYDROXIDE-SIMETH 200-200-20 MG/5ML PO SUSP: 30.0000 mL | ORAL | Status: DC | PRN

## 2012-10-26 MED ORDER — DEXTROSE-NACL 5-0.45 % IV SOLN: INTRAVENOUS | Status: DC

## 2012-10-26 MED ORDER — DIPHENHYDRAMINE HCL 12.5 MG/5ML PO ELIX: 12.5000 mg | ORAL_SOLUTION | ORAL | Status: DC | PRN

## 2012-10-26 MED ORDER — MEPERIDINE HCL 25 MG/ML IJ SOLN: 6.2500 mg | INTRAMUSCULAR | Status: DC | PRN

## 2012-10-26 MED ORDER — TRIAMTERENE-HCTZ 37.5-25 MG PO TABS
1.0000 | ORAL_TABLET | Freq: Every day | ORAL | Status: DC
  Administered 2012-10-27 – 2012-10-28 (×2): 1 via ORAL
  Filled 2012-10-26 (×4): qty 1

## 2012-10-26 MED ORDER — ACETAMINOPHEN 325 MG PO TABS: 650.0000 mg | ORAL_TABLET | Freq: Four times a day (QID) | ORAL | Status: DC | PRN

## 2012-10-26 MED ORDER — IRBESARTAN 300 MG PO TABS
300.0000 mg | ORAL_TABLET | Freq: Every day | ORAL | Status: DC
  Administered 2012-10-27 – 2012-10-28 (×2): 300 mg via ORAL
  Filled 2012-10-26 (×4): qty 1

## 2012-10-26 MED ORDER — PROPOFOL 10 MG/ML IV BOLUS
INTRAVENOUS | Status: DC | PRN
  Administered 2012-10-26: 200 mg via INTRAVENOUS

## 2012-10-26 MED ORDER — ZOLPIDEM TARTRATE 5 MG PO TABS: 5.0000 mg | ORAL_TABLET | Freq: Every evening | ORAL | Status: DC | PRN

## 2012-10-26 MED ORDER — MENTHOL 3 MG MT LOZG
1.0000 | LOZENGE | OROMUCOSAL | Status: DC | PRN
  Filled 2012-10-26 (×3): qty 9

## 2012-10-26 MED ORDER — ONDANSETRON HCL 4 MG PO TABS: 4.0000 mg | ORAL_TABLET | Freq: Four times a day (QID) | ORAL | Status: DC | PRN

## 2012-10-26 MED ORDER — FENTANYL CITRATE 0.05 MG/ML IJ SOLN
INTRAMUSCULAR | Status: AC
  Filled 2012-10-26: qty 2

## 2012-10-26 MED ORDER — PANTOPRAZOLE SODIUM 40 MG PO TBEC
80.0000 mg | DELAYED_RELEASE_TABLET | Freq: Every day | ORAL | Status: DC
  Administered 2012-10-26 – 2012-10-27 (×2): 80 mg via ORAL
  Filled 2012-10-26 (×2): qty 2

## 2012-10-26 MED ORDER — ACETAMINOPHEN 10 MG/ML IV SOLN: 1000.0000 mg | Freq: Once | INTRAVENOUS | Status: DC | PRN

## 2012-10-26 SURGICAL SUPPLY — 82 items
BANDAGE ELASTIC 4 VELCRO ST LF (GAUZE/BANDAGES/DRESSINGS) ×3 IMPLANT
BANDAGE ELASTIC 6 VELCRO ST LF (GAUZE/BANDAGES/DRESSINGS) ×3 IMPLANT
BANDAGE ESMARK 6X9 LF (GAUZE/BANDAGES/DRESSINGS) ×2 IMPLANT
BLADE SAG 18X100X1.27 (BLADE) ×3 IMPLANT
BLADE SAW SAG 90X13X1.27 (BLADE) ×3 IMPLANT
BLADE SURG ROTATE 9660 (MISCELLANEOUS) IMPLANT
BNDG CMPR 9X6 STRL LF SNTH (GAUZE/BANDAGES/DRESSINGS) ×2
BNDG COHESIVE 4X5 TAN STRL (GAUZE/BANDAGES/DRESSINGS) ×3 IMPLANT
BNDG ESMARK 6X9 LF (GAUZE/BANDAGES/DRESSINGS) ×3
BOWL SMART MIX CTS (DISPOSABLE) IMPLANT
CLOTH BEACON ORANGE TIMEOUT ST (SAFETY) ×3 IMPLANT
COVER BACK TABLE 24X17X13 BIG (DRAPES) IMPLANT
COVER MAYO STAND STRL (DRAPES) ×3 IMPLANT
COVER SURGICAL LIGHT HANDLE (MISCELLANEOUS) ×3 IMPLANT
CUFF TOURNIQUET SINGLE 34IN LL (TOURNIQUET CUFF) ×2 IMPLANT
CUFF TOURNIQUET SINGLE 44IN (TOURNIQUET CUFF) IMPLANT
DISC DIAMOND MED (BURR) IMPLANT
DRAPE EXTREMITY T 121X128X90 (DRAPE) ×3 IMPLANT
DRAPE INCISE IOBAN 66X45 STRL (DRAPES) ×3 IMPLANT
DRAPE U-SHAPE 47X51 STRL (DRAPES) ×3 IMPLANT
DRSG PAD ABDOMINAL 8X10 ST (GAUZE/BANDAGES/DRESSINGS) ×3 IMPLANT
DURAPREP 26ML APPLICATOR (WOUND CARE) ×3 IMPLANT
ELECT REM PT RETURN 9FT ADLT (ELECTROSURGICAL) ×3
ELECTRODE REM PT RTRN 9FT ADLT (ELECTROSURGICAL) ×2 IMPLANT
EVACUATOR 1/8 PVC DRAIN (DRAIN) ×1 IMPLANT
GAUZE XEROFORM 1X8 LF (GAUZE/BANDAGES/DRESSINGS) ×3 IMPLANT
GAUZE XEROFORM 5X9 LF (GAUZE/BANDAGES/DRESSINGS) ×2 IMPLANT
GLOVE BIO SURGEON STRL SZ7 (GLOVE) ×3 IMPLANT
GLOVE BIO SURGEON STRL SZ7.5 (GLOVE) ×3 IMPLANT
GLOVE BIOGEL PI IND STRL 7.0 (GLOVE) ×2 IMPLANT
GLOVE BIOGEL PI IND STRL 8 (GLOVE) ×2 IMPLANT
GLOVE BIOGEL PI INDICATOR 7.0 (GLOVE) ×1
GLOVE BIOGEL PI INDICATOR 8 (GLOVE) ×1
GLOVE SURG SS PI 7.5 STRL IVOR (GLOVE) ×2 IMPLANT
GOWN EXTRA PROTECTION XXL 0583 (GOWNS) ×1 IMPLANT
GOWN PREVENTION PLUS XLARGE (GOWN DISPOSABLE) ×3 IMPLANT
GOWN STRL NON-REIN LRG LVL3 (GOWN DISPOSABLE) ×6 IMPLANT
HANDPIECE INTERPULSE COAX TIP (DISPOSABLE) ×3
HOOD PEEL AWAY FACE SHEILD DIS (HOOD) ×7 IMPLANT
IMMOBILIZER KNEE 22 UNIV (SOFTGOODS) ×4 IMPLANT
KIT BASIN OR (CUSTOM PROCEDURE TRAY) ×3 IMPLANT
KIT ROOM TURNOVER OR (KITS) ×3 IMPLANT
MANIFOLD NEPTUNE II (INSTRUMENTS) ×3 IMPLANT
NEEDLE 22X1 1/2 (OR ONLY) (NEEDLE) IMPLANT
NS IRRIG 1000ML POUR BTL (IV SOLUTION) ×3 IMPLANT
PACK ORTHO EXTREMITY (CUSTOM PROCEDURE TRAY) ×3 IMPLANT
PACK TOTAL JOINT (CUSTOM PROCEDURE TRAY) ×3 IMPLANT
PAD ARMBOARD 7.5X6 YLW CONV (MISCELLANEOUS) ×6 IMPLANT
PAD CAST 4YDX4 CTTN HI CHSV (CAST SUPPLIES) ×3 IMPLANT
PADDING CAST COTTON 4X4 STRL (CAST SUPPLIES) ×3
PADDING CAST COTTON 6X4 STRL (CAST SUPPLIES) ×3 IMPLANT
PLATE ROT INSERT 12.5MM SIZE 3 (Plate) ×2 IMPLANT
RASP HELIOCORDIAL MED (MISCELLANEOUS) IMPLANT
RETRIEVER SUT HEWSON (MISCELLANEOUS) ×2 IMPLANT
SET HNDPC FAN SPRY TIP SCT (DISPOSABLE) ×1 IMPLANT
SPONGE GAUZE 4X4 12PLY (GAUZE/BANDAGES/DRESSINGS) ×4 IMPLANT
SPONGE LAP 18X18 X RAY DECT (DISPOSABLE) ×3 IMPLANT
STAPLER VISISTAT 35W (STAPLE) ×3 IMPLANT
STOCKINETTE IMPERVIOUS 9X36 MD (GAUZE/BANDAGES/DRESSINGS) ×3 IMPLANT
SUCTION FRAZIER TIP 10 FR DISP (SUCTIONS) ×3 IMPLANT
SUT ETHIBOND 2 V 37 (SUTURE) IMPLANT
SUT FIBERWIRE #2 38 REV NDL BL (SUTURE) ×6
SUT PDS AB 4-0 P3 18 (SUTURE) IMPLANT
SUT VIC AB 0 CT1 27 (SUTURE) ×3
SUT VIC AB 0 CT1 27XBRD ANBCTR (SUTURE) ×2 IMPLANT
SUT VIC AB 1 CT1 27 (SUTURE) ×3
SUT VIC AB 1 CT1 27XBRD ANBCTR (SUTURE) ×2 IMPLANT
SUT VIC AB 1 CTX 36 (SUTURE)
SUT VIC AB 1 CTX36XBRD ANBCTR (SUTURE) ×1 IMPLANT
SUT VIC AB 2-0 CT1 27 (SUTURE) ×3
SUT VIC AB 2-0 CT1 TAPERPNT 27 (SUTURE) ×2 IMPLANT
SUT VIC AB 2-0 CTB1 (SUTURE) ×1 IMPLANT
SUTURE FIBERWR#2 38 REV NDL BL (SUTURE) ×2 IMPLANT
SYR CONTROL 10ML LL (SYRINGE) IMPLANT
TOWEL OR 17X24 6PK STRL BLUE (TOWEL DISPOSABLE) ×3 IMPLANT
TOWEL OR 17X26 10 PK STRL BLUE (TOWEL DISPOSABLE) ×3 IMPLANT
TRAY FOLEY CATH 14FR (SET/KITS/TRAYS/PACK) ×2 IMPLANT
TUBE ANAEROBIC SPECIMEN COL (MISCELLANEOUS) ×3 IMPLANT
TUBE CONNECTING 12X1/4 (SUCTIONS) ×3 IMPLANT
UNDERPAD 30X30 INCONTINENT (UNDERPADS AND DIAPERS) ×1 IMPLANT
WATER STERILE IRR 1000ML POUR (IV SOLUTION) ×3 IMPLANT
YANKAUER SUCT BULB TIP NO VENT (SUCTIONS) ×3 IMPLANT

## 2012-10-26 NOTE — Evaluation (Signed)
Physical Therapy Evaluation Patient Details Name: Doris Pacheco MRN: [id number] DOB: 08-Jun-1941 Today's Date: 10/26/2012 Time: 1342-1410 PT Time Calculation (min): 28 min  PT Assessment / Plan / Recommendation Clinical Impression  Pt is a 72 y/o female s/p Patellectomy of R TKA.  Pt lives at home with support of her Daughter and Daughter -in-law. Pt should be safe to d/c to home with HHPT and support of family.  Acute to follow pt.     PT Assessment  Patient needs continued PT services    Follow Up Recommendations  Home health PT;Supervision - Intermittent    Does the patient have the potential to tolerate intense rehabilitation      Barriers to Discharge None NONE    Equipment Recommendations  None recommended by PT    Recommendations for Other Services     Frequency 7X/week    Precautions / Restrictions Precautions Precautions: Knee Precaution Comments: NO ROM IN RIGHT KNEE Required Braces or Orthoses: Knee Immobilizer - Right Knee Immobilizer - Right: On at all times Restrictions Weight Bearing Restrictions: Yes RLE Weight Bearing: Weight bearing as tolerated Other Position/Activity Restrictions: No ROM IN Right Knee   Pertinent Vitals/Pain 2-4/10 pain in knee.  Pt medicated prior to session.       Mobility  Bed Mobility Bed Mobility: Supine to Sit Supine to Sit: 4: Min assist;HOB flat;With rails Details for Bed Mobility Assistance: Assist for R LE secondary to pain and weakness from surgery.. Pt managed at least 90% Transfers Transfers: Sit to Stand;Stand to Sit Sit to Stand: 4: Min guard;From bed;With upper extremity assist Stand to Sit: 4: Min guard;To chair/3-in-1;With upper extremity assist Details for Transfer Assistance: Cues for safe technique.  Ambulation/Gait Ambulation/Gait Assistance: 5: Supervision Ambulation Distance (Feet): 10 Feet Assistive device: Rolling walker Ambulation/Gait Assistance Details: VCs for gait sequencing.  Gait Pattern:  Step-to pattern Stairs: No Wheelchair Mobility Wheelchair Mobility: No    Exercises     PT Diagnosis: Abnormality of gait;Generalized weakness;Acute pain  PT Problem List: Decreased strength;Decreased mobility;Pain PT Treatment Interventions: Gait training;Functional mobility training;Therapeutic activities;Therapeutic exercise;Patient/family education;Stair training   PT Goals Acute Rehab PT Goals PT Goal Formulation: With patient Time For Goal Achievement: 11/02/12 Potential to Achieve Goals: Good Pt will go Supine/Side to Sit: Independently;with HOB 0 degrees PT Goal: Supine/Side to Sit - Progress: Goal set today Pt will go Sit to Supine/Side: Independently;with HOB 0 degrees PT Goal: Sit to Supine/Side - Progress: Goal set today Pt will Transfer Bed to Chair/Chair to Bed: with modified independence PT Transfer Goal: Bed to Chair/Chair to Bed - Progress: Goal set today Pt will Ambulate: >150 feet;with rolling walker PT Goal: Ambulate - Progress: Goal set today Pt will Go Up / Down Stairs: 3-5 stairs;with modified independence;with least restrictive assistive device PT Goal: Up/Down Stairs - Progress: Goal set today Pt will Perform Home Exercise Program: Independently PT Goal: Perform Home Exercise Program - Progress: Goal set today  Visit Information  Last PT Received On: 10/26/12 Assistance Needed: +1    Subjective Data  Subjective: Agree to PT eval.  Patient Stated Goal: walk without pain.   Prior Functioning  Home Living Lives With: Alone Available Help at Discharge: Family;Available 24 hours/day Type of Home: House Home Access: Stairs to enter;Ramped entrance Entrance Stairs-Number of Steps: 3 Entrance Stairs-Rails: Right;Left;Can reach both Home Layout: One level Bathroom Shower/Tub: Walk-in Contractor: Handicapped height Bathroom Accessibility: Yes How Accessible: Accessible via walker Home Adaptive Equipment: Bedside  commode/3-in-1;Built-in shower seat;Shower chair  with back;Walker - rolling;Walker - standard;Straight cane Prior Function Level of Independence: Independent with assistive device(s) Able to Take Stairs?: Yes Driving: Yes Vocation: Retired Musician: No difficulties    Copywriter, advertising Overall Cognitive Status: Appears within functional limits for tasks assessed/performed Arousal/Alertness: Awake/alert Orientation Level: Appears intact for tasks assessed Behavior During Session: [facility] for tasks performed    Extremity/Trunk Assessment Right Upper Extremity Assessment RUE ROM/Strength/Tone: Within functional levels Left Upper Extremity Assessment LUE ROM/Strength/Tone: Within functional levels Right Lower Extremity Assessment RLE ROM/Strength/Tone: Unable to fully assess Left Lower Extremity Assessment LLE ROM/Strength/Tone: Within functional levels Trunk Assessment Trunk Assessment: Normal   Balance    End of Session PT - End of Session Equipment Utilized During Treatment: Gait belt;Right knee immobilizer Activity Tolerance: Patient tolerated treatment well Patient left: in chair;with call bell/phone within reach Nurse Communication: Mobility status  GP     Tramel Westbrook 10/26/2012, 3:15 PM  Daritza Brees L. Grayden Burley DPT [phone]

## 2012-10-26 NOTE — Progress Notes (Signed)
Orthopedic Tech Progress Note Patient Details:  Doris Pacheco 08/15/1941 [id number]  Patient ID: [name], female   DOB: [birth year], 72 y.o.   MRN: [id number] Trapeze bar patient helper  Nikki Dom 10/26/2012, 3:02 PM

## 2012-10-26 NOTE — Progress Notes (Signed)
UR COMPLETED  

## 2012-10-26 NOTE — Anesthesia Preprocedure Evaluation (Addendum)
Anesthesia Evaluation  Patient identified by MRN, date of birth, ID band Patient awake    Reviewed: Allergy & Precautions, H&P , NPO status , Patient's Chart, lab work & pertinent test results  Airway Mallampati: II      Dental  (+) Teeth Intact and Dental Advidsory Given   Pulmonary  breath sounds clear to auscultation  Pulmonary exam normal       Cardiovascular hypertension, On Medications Rhythm:Regular Rate:Normal     Neuro/Psych CVA, No Residual Symptoms    GI/Hepatic GERD-  Medicated and Controlled,  Endo/Other    Renal/GU      Musculoskeletal   Abdominal   Peds  Hematology   Anesthesia Other Findings   Reproductive/Obstetrics                         Anesthesia Physical Anesthesia Plan  ASA: III  Anesthesia Plan: General and Regional   Post-op Pain Management:    Induction: Inhalational  Airway Management Planned: LMA  Additional Equipment:   Intra-op Plan:   Post-operative Plan: Extubation in OR  Informed Consent: I have reviewed the patients History and Physical, chart, labs and discussed the procedure including the risks, benefits and alternatives for the proposed anesthesia with the patient or authorized representative who has indicated his/her understanding and acceptance.   Dental Advisory Given  Plan Discussed with: CRNA and Anesthesiologist  Anesthesia Plan Comments:      Anesthesia Quick Evaluation

## 2012-10-26 NOTE — Addendum Note (Signed)
Addendum  created 10/26/12 1121 by Gaylan Gerold, MD   Modules edited:Anesthesia Attestations

## 2012-10-26 NOTE — Transfer of Care (Signed)
Immediate Anesthesia Transfer of Care Note  Patient: Doris Pacheco  Procedure(s) Performed: Procedure(s) (LRB) with comments: PATELLECTOMY (Right) IRRIGATION AND DEBRIDEMENT KNEE WITH POLY EXCHANGE (Right) - poly exchange  Patient Location: PACU  Anesthesia Type:General  Level of Consciousness: awake and alert   Airway & Oxygen Therapy: Patient Spontanous Breathing and Patient connected to nasal cannula oxygen  Post-op Assessment: Report given to PACU RN, Post -op Vital signs reviewed and stable and Patient moving all extremities X 4  Post vital signs: Reviewed and stable  Complications: No apparent anesthesia complications

## 2012-10-26 NOTE — Interval H&P Note (Signed)
History and Physical Interval Note:  10/26/2012 7:13 AM  Doris Pacheco  has presented today for surgery, with the diagnosis of COMMINUTED RIGHT TOTAL KNEE ARTHROPLASTY PATELLA FRACTURE  The various methods of treatment have been discussed with the patient and family. After consideration of risks, benefits and other options for treatment, the patient has consented to  Procedure(s) (LRB) with comments: PATELLECTOMY (Right) TOTAL KNEE REVISION (Right) - REMOVE PATELLA PROSTHESIS STAND-BY TKA REVISION  as a surgical intervention .  The patient's history has been reviewed, patient examined, no change in status, stable for surgery.  I have reviewed the patient's chart and labs.  Questions were answered to the patient's satisfaction.     [name] J   

## 2012-10-26 NOTE — Addendum Note (Signed)
Addendum  created 10/26/12 1121 by Liberti Appleton R Neosha Switalski, MD   Modules edited:Anesthesia Attestations    

## 2012-10-26 NOTE — Progress Notes (Signed)
Called ortho for overhead trapeze, none available at this time.  Will continue to monitor.

## 2012-10-26 NOTE — Preoperative (Signed)
Beta Blockers   Reason not to administer Beta Blockers:Not Applicable 

## 2012-10-26 NOTE — Anesthesia Procedure Notes (Addendum)
Anesthesia Regional Block:  Femoral nerve block  Pre-Anesthetic Checklist: ,, timeout performed, Correct Patient, Correct Site, Correct Laterality, Correct Procedure, Correct Position, site marked, Risks and benefits discussed,  Surgical consent,  Pre-op evaluation,  At surgeon's request and post-op pain management  Laterality: Right  Prep: Maximum Sterile Barrier Precautions used and chloraprep       Needles:  Injection technique: Single-shot  Needle Type: Stimiplex      Needle Gauge: 20 and 21 G    Additional Needles:  Procedures: ultrasound guided (picture in chart) Femoral nerve block Narrative:  Start time: 10/26/2012 7:12 AM End time: 10/26/2012 7:18 AM Injection made incrementally with aspirations every 5 mL.  Performed by: Personally  Anesthesiologist: Treavor Blomquist   Procedure Name: LMA Insertion Date/Time: 10/26/2012 7:35 AM Performed by: Carmela Rima Pre-anesthesia Checklist: Patient identified, Timeout performed, Emergency Drugs available, Suction available and Patient being monitored Patient Re-evaluated:Patient Re-evaluated prior to inductionOxygen Delivery Method: Circle system utilized Preoxygenation: Pre-oxygenation with 100% oxygen Intubation Type: IV induction Ventilation: Mask ventilation without difficulty LMA: LMA inserted LMA Size: 4.0 Number of attempts: 1 Placement Confirmation: positive ETCO2 and breath sounds checked- equal and bilateral Tube secured with: Tape Dental Injury: Teeth and Oropharynx as per pre-operative assessment

## 2012-10-26 NOTE — Interval H&P Note (Signed)
History and Physical Interval Note:  10/26/2012 7:13 AM  Doris Pacheco  has presented today for surgery, with the diagnosis of COMMINUTED RIGHT TOTAL KNEE ARTHROPLASTY PATELLA FRACTURE  The various methods of treatment have been discussed with the patient and family. After consideration of risks, benefits and other options for treatment, the patient has consented to  Procedure(s) (LRB) with comments: PATELLECTOMY (Right) TOTAL KNEE REVISION (Right) - REMOVE PATELLA PROSTHESIS STAND-BY TKA REVISION  as a surgical intervention .  The patient's history has been reviewed, patient examined, no change in status, stable for surgery.  I have reviewed the patient's chart and labs.  Questions were answered to the patient's satisfaction.     Nestor Lewandowsky

## 2012-10-26 NOTE — Op Note (Signed)
Pre Op Dx: R TKA periprosthetic patella fracture, unstable patellar implants, tibial bearing wear.  Post Op Dx: Same  Procedure: Right total knee patellectomy many with removal of unstable patellar implant followed by quadriceps tendon and mechanism repair, revision of tibial bearing. We removed the 12.5 mm DePuy Sigma RP bearing and revised to another #3 12.5 mm Sigma RP bearing.  Surgeon: Nestor Lewandowsky, MD  Assistant: Shirl Harris PA-C  Anesthesia: General  EBL: Minimal  Fluids: 1500 cc  Tourniquet Time: 60 minutes  Indications: Status post right total knee arthroplasty using DePuy Sigma RP design in the fall of 2011. Patient [place] church parking [place] in the fall of 2013 and injured the patella of her right total knee. She is followed by her primary surgeon who noted diastases and fracture of the patella in January 2014. I saw her in consultation for reconstruction of her quadriceps mechanism and possible revision of the patella and/or any other implants. The patient still had quadriceps power when I met her but x-ray showed obvious diastases of the patella and what appeared to be loosening of the patella component as it had slid off of the bone inferiorly. Because of increasing pain and increasing weakness surgical intervention was discussed and consented to by the patient risks and benefits of surgery were gone over in detail.  Procedure: The patient was identified by arm band receive preoperative IV antibiotics in the holding area. She is taken to operating room 6 for the appropriate anesthetic monitors were attached and general endotracheal anesthesia was induced. A Foley catheter was inserted. Tourniquet applied high to the right lower trembly which was then prepped and draped in usual sterile fashion from the toes to the tourniquet. A timeout procedure was performed. The limb was wrapped with an Esmarch bandage and the tourniquet inflated to 350 mm of mercury with the knee bent. We  began the operation by making an anterior midline incision excising the old incision through the skin and subcutaneous tissue we then performed a medial parapatellar arthrotomy and medially identified a loose patellar component it was removed without difficulty. The patella itself was noted to be in 5 or 6 fragments and none of them larger than a centimeter to a centimeter and a half in size and most of them very thin. These were then carefully dissected out of the quadriceps expansion which going from the quadriceps tendon to the patellar tendon. At this point the knee was flexed up only noticed some scratches on the tibial bearing from the cement fragmentation as patella came loose. We went ahead and revise the tibial bearing at this point removing the 12.5 mm bearing #3 and replacing it with the same size bearing. While the bearing was out we went ahead and tested the stability of the tibial and femoral implants and found that they were in good shape. There was no loosening. We then direct her attention back to the quadriceps mechanism and starting out laterally and superiorly at the quadriceps tendon we made a diagonal incision going distally and medially to the patellar tendon. This allowed [place] to overlap the 2 tendons by about 2 cm and perform a double breasted repair thickening the tissue where the patella had been. This repair was accomplished with running #2 FiberWire. After the repair the knee could be flexed to 90 with good motion of the quadriceps mechanism and no evidence of loosening. At this point a tourniquet was let down the wound was irrigated out normal saline solution and  the parapatellar arthrotomy closed with running #1 Vicryl suture. The subcutaneous tissue was closed with 0 and 2-0 Vicryl suture and the skin with staples. A dressing of Xerofoam 4 x 4 dressing sponges web roll and Ace wrap and a knee immobilizer was then placed. The patient was awakened extubated and taken to the recovery  without difficulty.

## 2012-10-26 NOTE — Progress Notes (Signed)
PT is recommending home with HH and not SNF. CSW will make CM aware. Clinical Social Worker will sign off for now as social work intervention is no longer needed. Please consult us again if new need arises.   Zakaria Fromer, MSW [phone] 

## 2012-10-26 NOTE — Progress Notes (Signed)
Orthopedic Tech Progress Note Patient Details:  Doris Pacheco Oct 19, 1940 [id number]  Patient ID: Doris Pacheco, female   DOB: 1941-09-02, 72 y.o.   MRN: [id number] Viewed order from doctor's order list  Nikki Dom 10/26/2012, 3:02 PM

## 2012-10-26 NOTE — Anesthesia Postprocedure Evaluation (Signed)
Anesthesia Post Note  Patient: Doris Pacheco  Procedure(s) Performed: Procedure(s) (LRB): PATELLECTOMY (Right) IRRIGATION AND DEBRIDEMENT KNEE WITH POLY EXCHANGE (Right)  Anesthesia type: General with FNB  Patient location: PACU  Post pain: Pain level controlled  Post assessment: Post-op Vital signs reviewed  Last Vitals: BP 143/64  Pulse 69  Temp 36.3 C (Oral)  Resp 11  SpO2 100%  Post vital signs: Reviewed  Level of consciousness: sedated  Complications: No apparent anesthesia complications

## 2012-10-27 LAB — CBC
HCT: 30.6 % — ABNORMAL LOW (ref 36.0–46.0)
Hemoglobin: 10.1 g/dL — ABNORMAL LOW (ref 12.0–15.0)
MCH: 28 pg (ref 26.0–34.0)
MCV: 84.8 fL (ref 78.0–100.0)
RBC: 3.61 MIL/uL — ABNORMAL LOW (ref 3.87–5.11)
WBC: 5.1 10*3/uL (ref 4.0–10.5)

## 2012-10-27 LAB — BASIC METABOLIC PANEL
CO2: 26 mEq/L (ref 19–32)
Calcium: 8.2 mg/dL — ABNORMAL LOW (ref 8.4–10.5)
Chloride: 102 mEq/L (ref 96–112)
Creatinine, Ser: 0.71 mg/dL (ref 0.50–1.10)
Glucose, Bld: 117 mg/dL — ABNORMAL HIGH (ref 70–99)

## 2012-10-27 NOTE — Progress Notes (Signed)
Physical Therapy Treatment Patient Details Name: Doris Pacheco MRN: [id number] DOB: [birth year] Today's Date: 10/27/2012 Time: 4098-1191 PT Time Calculation (min): 29 min  PT Assessment / Plan / Recommendation Comments on Treatment Session  pt making great progress toward goals with incr activity today and less assistance needed today.    Follow Up Recommendations  Home health PT;Supervision - Intermittent           Equipment Recommendations  None recommended by PT       Frequency 7X/week   Plan Discharge plan remains appropriate;Frequency remains appropriate    Precautions / Restrictions Precautions Precautions: Knee Precaution Comments: No ROM in right knee Required Braces or Orthoses: Knee Immobilizer - Right Knee Immobilizer - Right: On at all times Restrictions RLE Weight Bearing: Weight bearing as tolerated       Mobility  Bed Mobility Bed Mobility: Sitting - Scoot to Edge of Bed Supine to Sit: 5: Supervision;HOB flat Sitting - Scoot to Edge of Bed: 5: Supervision Details for Bed Mobility Assistance: Cues for technique to use left LE to move right LE of edge of bed. Incr time needed with mobiltiy Transfers Sit to Stand: 5: Supervision;From bed;With upper extremity assist Stand to Sit: 5: Supervision;To chair/3-in-1;With upper extremity assist;With armrests Details for Transfer Assistance: cues for hand placment with transfers. Incr time needed wtih transfers. Ambulation/Gait Ambulation/Gait Assistance: 4: Min guard Ambulation Distance (Feet): 70 Feet Assistive device: Rolling walker Ambulation/Gait Assistance Details: min vc's/tc's for sequence, walker position and posture with gait. Gait Pattern: Step-to pattern;Decreased stride length;Decreased step length - left;Decreased stance time - right;Antalgic Gait velocity: Decreased    Exercises Total Joint Exercises Ankle Circles/Pumps: AROM;Both;10 reps;Supine Quad Sets: AROM;Strengthening;Right;10  reps;Supine Hip ABduction/ADduction: AAROM;Strengthening;Right;10 reps;Supine Straight Leg Raises: AAROM;Strengthening;Right;10 reps;Supine     PT Goals Acute Rehab PT Goals PT Goal: Supine/Side to Sit - Progress: Progressing toward goal PT Transfer Goal: Bed to Chair/Chair to Bed - Progress: Progressing toward goal PT Goal: Ambulate - Progress: Progressing toward goal PT Goal: Perform Home Exercise Program - Progress: Progressing toward goal  Visit Information  Last PT Received On: 10/27/12 Assistance Needed: +1    Subjective Data  Subjective: No new complaints, agreeable to therapy today.   Cognition  Cognition Overall Cognitive Status: Appears within functional limits for tasks assessed/performed Arousal/Alertness: Awake/alert Orientation Level: Appears intact for tasks assessed Behavior During Session: [facility] for tasks performed       End of Session PT - End of Session Equipment Utilized During Treatment: Gait belt;Right knee immobilizer Activity Tolerance: Patient tolerated treatment well Patient left: in chair;with call bell/phone within reach Nurse Communication: Mobility status;Patient requests pain meds   GP     Sallyanne Kuster 10/27/2012, 12:45 PM  Sallyanne Kuster, PTA Office- [phone]

## 2012-10-27 NOTE — Progress Notes (Signed)
Patient looks great. Minimal pain.  BP 128/65  Pulse 72  Temp(Src) 98.1 F (36.7 C) (Oral)  Resp 18  SpO2 100%  NVI Dressing in place  Hg 10.1  S/p right TKA  - likely d/c home Sunday - dressing change tomorrow - up with PT (WBAT)

## 2012-10-27 NOTE — Progress Notes (Signed)
OT Cancellation Note  Patient Details Name: Doris Pacheco MRN: [id number] DOB: 1940-11-13   Cancelled Treatment:     Pt states she is in a lot of pain and that they haven't gotten her IV restarted to get her pain better under control, prefers to wait until tomorrow.  Evette Georges [phone] 10/27/2012, 4:52 PM

## 2012-10-27 NOTE — Progress Notes (Signed)
Physical Therapy Treatment Patient Details Name: Doris Pacheco MRN: [id number] DOB: 1941/05/21 Today's Date: 10/27/2012 Time: 4098-1191 PT Time Calculation (min): 11 min  PT Assessment / Plan / Recommendation Comments on Treatment Session  Increased pain this pm with limited participation due to pain. Pt very motivated and became tearful with therapy session due to having the pain and not able to fully participate. Comfort and reassurance provided to pt and RN nofied of pt's pain.    Follow Up Recommendations  Home health PT;Supervision - Intermittent           Equipment Recommendations  None recommended by PT       Frequency 7X/week   Plan Discharge plan remains appropriate;Frequency remains appropriate    Precautions / Restrictions Precautions Precautions: Knee Precaution Comments: No ROM in right knee Required Braces or Orthoses: Knee Immobilizer - Right Knee Immobilizer - Right: On at all times Restrictions RLE Weight Bearing: Weight bearing as tolerated    Exercises Total Joint Exercises Ankle Circles/Pumps: AROM;Both;10 reps;Seated Quad Sets: AROM;Both;10 reps;Seated;Strengthening Straight Leg Raises: AAROM;Strengthening;Right;10 reps;Seated      PT Goals Acute Rehab PT Goals PT Goal: Perform Home Exercise Program - Progress: Progressing toward goal  Visit Information  Last PT Received On: 10/27/12 Assistance Needed: +1    Subjective Data  Subjective: Pt reporting incr knee pain this pm, became tearful with PTA. Comfort provided and RN notifed on pain. Exercises only this pm due to pain.   Cognition  Cognition Overall Cognitive Status: Appears within functional limits for tasks assessed/performed Arousal/Alertness: Awake/alert Orientation Level: Appears intact for tasks assessed Behavior During Session: [facility] for tasks performed       End of Session PT - End of Session Equipment Utilized During Treatment: Right knee immobilizer Activity Tolerance: Patient  limited by pain Patient left: in chair;with call bell/phone within reach Nurse Communication: Patient requests pain meds   GP     Sallyanne Kuster 10/27/2012, 2:05 PM  Sallyanne Kuster, PTA Office- [phone]

## 2012-10-28 LAB — CBC
HCT: 31.9 % — ABNORMAL LOW (ref 36.0–46.0)
MCH: 27.4 pg (ref 26.0–34.0)
MCV: 84.2 fL (ref 78.0–100.0)
Platelets: 203 10*3/uL (ref 150–400)
RBC: 3.79 MIL/uL — ABNORMAL LOW (ref 3.87–5.11)
RDW: 14.6 % (ref 11.5–15.5)

## 2012-10-28 NOTE — Progress Notes (Signed)
Physical Therapy Treatment Patient Details Name: Doris Pacheco MRN: [id number] DOB: 08-24-1941 Today's Date: 10/28/2012 Time: 1415-1430 PT Time Calculation (min): 15 min  PT Assessment / Plan / Recommendation Comments on Treatment Session  Pt requested training on one step to enter her bed at home. Pt has a [employer] step in front of her bed.      Follow Up Recommendations  Home health PT;Supervision - Intermittent     Does the patient have the potential to tolerate intense rehabilitation     Barriers to Discharge        Equipment Recommendations  None recommended by PT    Recommendations for Other Services    Frequency     Plan All goals met and education completed, patient dischaged from PT services    Precautions / Restrictions Precautions Precautions: Knee Precaution Comments: No ROM in right knee Required Braces or Orthoses: Knee Immobilizer - Right Knee Immobilizer - Right: On at all times Restrictions Weight Bearing Restrictions: Yes RLE Weight Bearing: Weight bearing as tolerated   Pertinent Vitals/Pain No c/o pain.     Mobility  Bed Mobility Bed Mobility: Supine to Sit;Sit to Supine Supine to Sit: 6: Modified independent (Device/Increase time) Sitting - Scoot to Edge of Bed: 6: Modified independent (Device/Increase time) Transfers Transfers: Sit to Stand;Stand to Sit Sit to Stand: 6: Modified independent (Device/Increase time);From bed;From chair/3-in-1 Stand to Sit: 6: Modified independent (Device/Increase time);To chair/3-in-1;To bed Ambulation/Gait Ambulation/Gait Assistance: Not tested (comment) Stairs: Yes Stairs Assistance: 5: Supervision Stairs Assistance Details (indicate cue type and reason): Instructed pt in backward technique. Stair Management Technique: No rails;Backwards;With walker Number of Stairs: 1    Exercises     PT Diagnosis:    PT Problem List:   PT Treatment Interventions:     PT Goals Acute Rehab PT Goals PT Goal Formulation:  With patient Time For Goal Achievement: 11/02/12 Potential to Achieve Goals: Good Pt will go Supine/Side to Sit: Independently;with HOB 0 degrees PT Goal: Supine/Side to Sit - Progress: Met Pt will go Sit to Supine/Side: Independently;with HOB 0 degrees PT Goal: Sit to Supine/Side - Progress: Met Pt will Transfer Bed to Chair/Chair to Bed: with modified independence PT Transfer Goal: Bed to Chair/Chair to Bed - Progress: Met Pt will Ambulate: >150 feet;with rolling walker PT Goal: Ambulate - Progress: Met Pt will Go Up / Down Stairs: 1-2 stairs;with supervision PT Goal: Up/Down Stairs - Progress: Met Pt will Perform Home Exercise Program: Independently PT Goal: Perform Home Exercise Program - Progress: Met  Visit Information  Last PT Received On: 10/28/12 Assistance Needed: +1    Subjective Data      Cognition       Balance     End of Session     GP     Angee Gupton 10/28/2012, 3:05 PM Caralyn Twining L. Jaci Desanto DPT [phone]

## 2012-10-28 NOTE — Progress Notes (Signed)
NCM spoke to pt and offered choice for [facility]. Pt requested [facility] for HH. [facility] contact added to dc instructions. Faxed notification to [facility] of pt's scheduled dc home today with [facility] PT/OT and RN. Isidoro Donning RN CCM Case Mgmt phone [phone]

## 2012-10-28 NOTE — Progress Notes (Signed)
Occupational Therapy Note  OT order received and appreciated.  Per PT note, pt is performing functional mobility at supervision/mod I level with functional mobility.  Spoke with pt and family who was present in room, and pt has all necessary DME and 24/7 assist at home. No acute OT needs at this time. Will sign off.  10/28/2012 Cipriano Mile OTR/L Pager [phone] Office [phone]

## 2012-10-28 NOTE — Progress Notes (Signed)
Physical Therapy Treatment Patient Details Name: Doris Pacheco MRN: [id number] DOB: 1940-10-25 Today's Date: 10/28/2012 Time: 4098-1191 PT Time Calculation (min): 45 min  PT Assessment / Plan / Recommendation Comments on Treatment Session  Pt reporting that she has PRN/intemittent supervision available at home from her family. Pt called 2 family members while I was present to ensure that they would be available if d/cd today.  Pt has met all acute PT goals and is appropriate to d/c home.    Follow Up Recommendations  Home health PT;Supervision - Intermittent     Does the patient have the potential to tolerate intense rehabilitation     Barriers to Discharge        Equipment Recommendations  None recommended by PT    Recommendations for Other Services    Frequency 7X/week   Plan Discharge plan remains appropriate;Frequency remains appropriate    Precautions / Restrictions Precautions Precautions: Knee Precaution Comments: No ROM in right knee Required Braces or Orthoses: Knee Immobilizer - Right Knee Immobilizer - Right: On at all times Restrictions Weight Bearing Restrictions: Yes RLE Weight Bearing: Weight bearing as tolerated   Pertinent Vitals/Pain No c/o pain. Pt medicated earlier this morning.    Mobility  Bed Mobility Bed Mobility: Supine to Sit;Sit to Supine Supine to Sit: 6: Modified independent (Device/Increase time) Sitting - Scoot to Edge of Bed: 6: Modified independent (Device/Increase time) Details for Bed Mobility Assistance: Demonstrating safe technique.  Transfers Transfers: Sit to Stand;Stand to Sit Sit to Stand: 6: Modified independent (Device/Increase time) Stand to Sit: 5: Supervision;6: Modified independent (Device/Increase time) Details for Transfer Assistance: Vc to sit on edge of chair then scooting back.  Ambulation/Gait Ambulation/Gait Assistance: 6: Modified independent (Device/Increase time) Ambulation Distance (Feet): 150 Feet Assistive  device: Rolling walker Ambulation/Gait Assistance Details: Pt demonstrating safety with RW Gait Pattern: Antalgic;Step-through pattern Gait velocity: Decreased    Exercises Total Joint Exercises Ankle Circles/Pumps: AROM;Both;10 reps;Seated Quad Sets: AROM;Both;10 reps;Seated;Strengthening Hip ABduction/ADduction: AAROM;Strengthening;Right;10 reps;Supine Straight Leg Raises: AAROM;Strengthening;Right;10 reps;Seated   PT Diagnosis:    PT Problem List:   PT Treatment Interventions:     PT Goals Acute Rehab PT Goals PT Goal Formulation: With patient Time For Goal Achievement: 11/02/12 Potential to Achieve Goals: Good Pt will go Supine/Side to Sit: Independently;with HOB 0 degrees PT Goal: Supine/Side to Sit - Progress: Met Pt will go Sit to Supine/Side: Independently;with HOB 0 degrees PT Goal: Sit to Supine/Side - Progress: Met Pt will Transfer Bed to Chair/Chair to Bed: with modified independence PT Transfer Goal: Bed to Chair/Chair to Bed - Progress: Met Pt will Ambulate: >150 feet;with rolling walker PT Goal: Ambulate - Progress: Met Pt will Go Up / Down Stairs: 3-5 stairs;with modified independence;with least restrictive assistive device PT Goal: Up/Down Stairs - Progress: Discontinued (comment) Pt will Perform Home Exercise Program: Independently PT Goal: Perform Home Exercise Program - Progress: Met  Visit Information  Last PT Received On: 10/28/12 Assistance Needed: +1    Subjective Data  Subjective: Pt reports ambulating to bathroom and washing up independently this morning.  Patient Stated Goal: walk without pain.   Cognition  Cognition Overall Cognitive Status: Appears within functional limits for tasks assessed/performed Arousal/Alertness: Awake/alert Orientation Level: Appears intact for tasks assessed Behavior During Session: [facility] for tasks performed    Balance     End of Session PT - End of Session Equipment Utilized During Treatment: Right knee  immobilizer Activity Tolerance: Patient tolerated treatment well Patient left: in chair;with call bell/phone within  reach Nurse Communication: Patient requests pain meds   GP     Shirrell Solinger 10/28/2012, 1:14 PM Raynah Gomes L. Nevea Spiewak DPT [phone]

## 2012-10-28 NOTE — Progress Notes (Signed)
Patient feels well.  BP 145/51  Pulse 75  Temp(Src) 97.9 F (36.6 C) (Oral)  Resp 19  SpO2 100%  NVI Dressing CDI  S/p right TKA revision  - continue PT - ASA, percocet - PT recommending HHPT, patient requesting rehab - will place d/c order int he event that patient is able to safely be d/c'ed home (per PT recs)

## 2012-10-29 ENCOUNTER — Encounter (HOSPITAL_COMMUNITY): Payer: Self-pay | Admitting: Orthopedic Surgery

## 2012-11-18 NOTE — Discharge Summary (Signed)
Patient ID: Doris Pacheco MRN: [id number] DOB/AGE: 71/17/1942 71 y.o.  Admit date: 10/26/2012 Discharge date: 10/28/12  Admission Diagnoses:  Active Problems:   Loose total knee arthroplasty - right    Discharge Diagnoses:  Same  Past Medical History  Diagnosis Date  . Hypertension     takes meds daily  . Anxiety   . History of seasonal allergies   . Stroke     tia  . GERD (gastroesophageal reflux disease)   . Arthritis     Surgeries: Procedure(s): PATELLECTOMY IRRIGATION AND DEBRIDEMENT KNEE WITH POLY EXCHANGE on 10/26/2012   Consultants:    Discharged Condition: Improved  Hospital Course: Doris Pacheco is an 72 y.o. female who was admitted 10/26/2012 for operative treatment of<principal problem not specified>. Patient has severe unremitting pain that affects sleep, daily activities, and work/hobbies. After pre-op clearance the patient was taken to the operating room on 10/26/2012 and underwent  Procedure(s): PATELLECTOMY IRRIGATION AND DEBRIDEMENT KNEE WITH POLY EXCHANGE.    Patient was given perioperative antibiotics:  Anti-infectives   Start     Dose/Rate Route Frequency Ordered Stop   10/26/12 0600  ceFAZolin (ANCEF) IVPB 2 g/50 mL premix     2 g 100 mL/hr over 30 Minutes Intravenous On call to O.R. 10/25/12 1422 10/26/12 0750       Patient was given sequential compression devices, early ambulation, and chemoprophylaxis to prevent DVT.  Patient benefited maximally from hospital stay and there were no complications.    Recent vital signs: No data found.    Recent laboratory studies: No results found for this basename: WBC, HGB, HCT, PLT, NA, K, CL, CO2, BUN, CREATININE, GLUCOSE, PT, INR, CALCIUM, 2,  in the last 72 hours   Discharge Medications:     Medication List    STOP taking these medications       hydrocodone-acetaminophen 5-500 MG per capsule  Commonly known as:  LORCET-HD     traMADol 50 MG tablet  Commonly known as:  ULTRAM      TAKE these  medications       aspirin 325 MG EC tablet  Take 1 tablet (325 mg total) by mouth 2 (two) times daily.     esomeprazole 40 MG capsule  Commonly known as:  NEXIUM  Take 40 mg by mouth daily before breakfast.     fish oil-omega-3 fatty acids 1000 MG capsule  Take 1 g by mouth daily.     meloxicam 15 MG tablet  Commonly known as:  MOBIC  Take 15 mg by mouth daily.     montelukast 10 MG tablet  Commonly known as:  SINGULAIR  Take 10 mg by mouth at bedtime.     oxyCODONE-acetaminophen 5-325 MG per tablet  Commonly known as:  ROXICET  Take 1-2 tablets by mouth every 4 (four) hours as needed for pain.     triamterene-hydrochlorothiazide 37.5-25 MG per tablet  Commonly known as:  MAXZIDE-25  Take 1 tablet by mouth daily.     URIBEL 118 MG Caps  Take 1 capsule by mouth daily.     valsartan 320 MG tablet  Commonly known as:  DIOVAN  Take 320 mg by mouth daily.     vitamin C 500 MG tablet  Commonly known as:  ASCORBIC ACID  Take 500 mg by mouth daily.        Diagnostic Studies: Dg Chest 2 View  10/23/2012  *RADIOLOGY REPORT*  Clinical Data: Preop for knee surgery, on medication for hypertension.  CHEST - 2 VIEW  Comparison: Chest x-ray of 05/25/2010  Findings: No active infiltrate or effusion is seen.  The lungs are slightly hyperaerated.  Mediastinal contours appear normal.  The heart is within normal limits in size.  The descending thoracic aorta is ectatic.  There are degenerative changes throughout the thoracic spine.  IMPRESSION: Stable chest x-ray.  No active lung   Original Report Authenticated By: Dwyane Dee, M.D.    Dg Knee Right Port  10/26/2012  *RADIOLOGY REPORT*  Clinical Data: 72 year old female status post knee surgery.  PORTABLE RIGHT KNEE - 1-2 VIEW  Comparison: Radiographs 05/20/2007.  Findings: Portable cross-table lateral view at 0942 hours.  Skin staples, soft tissue swelling and subcutaneous gas along the anterior knee.  Previous total knee arthroplasty.   Patella not identified.  Hardware components appear intact and normally aligned on the lateral view.  IMPRESSION: Acute postoperative changes to the right knee.   Original Report Authenticated By: Erskine Speed, M.D.     Disposition: 06-Home-Health Care Svc      Discharge Orders   Future Orders Complete By Expires     Call MD for:  redness, tenderness, or signs of infection (pain, swelling, redness, odor or green/yellow discharge around incision site)  As directed     Call MD for:  severe uncontrolled pain  As directed     Call MD for:  temperature >100.4  As directed     Change dressing (specify)  As directed     Comments:      Dressing change as needed.    Discharge instructions  As directed     Comments:      F/U with Dr. Turner Daniels as scheduled (post-op day #14).  Immobilizer at all times, weightbearing as tolerated.    Discharge patient  As directed     Comments:      Per PT recs    Driving Restrictions  As directed     Comments:      No driving for 2 weeks.    Increase activity slowly  As directed     May shower / Bathe  As directed     Walker   As directed        Follow-up Information   Follow up with [facility] . [facility], Occupational Therapy and RN)    Contact information:   [phone]       Signed: Hazle Nordmann. 11/18/2012, 7:44 PM

## 2013-07-27 ENCOUNTER — Emergency Department (HOSPITAL_COMMUNITY)
Admission: EM | Admit: 2013-07-27 | Discharge: 2013-07-27 | Disposition: A | Discharge status: Home / Self Care | Payer: Medicare Other | Attending: Emergency Medicine | Admitting: Emergency Medicine

## 2013-07-27 ENCOUNTER — Emergency Department (HOSPITAL_COMMUNITY): Payer: Medicare Other

## 2013-07-27 ENCOUNTER — Encounter (HOSPITAL_COMMUNITY): Payer: Self-pay | Admitting: Emergency Medicine

## 2013-07-27 ENCOUNTER — Encounter: Payer: Self-pay | Admitting: Interventional Cardiology

## 2013-07-27 DIAGNOSIS — I10 Essential (primary) hypertension: Secondary | ICD-10-CM | POA: Insufficient documentation

## 2013-07-27 DIAGNOSIS — S0990XA Unspecified injury of head, initial encounter: Secondary | ICD-10-CM

## 2013-07-27 DIAGNOSIS — K219 Gastro-esophageal reflux disease without esophagitis: Secondary | ICD-10-CM | POA: Insufficient documentation

## 2013-07-27 DIAGNOSIS — Z8673 Personal history of transient ischemic attack (TIA), and cerebral infarction without residual deficits: Secondary | ICD-10-CM | POA: Insufficient documentation

## 2013-07-27 DIAGNOSIS — Z889 Allergy status to unspecified drugs, medicaments and biological substances status: Secondary | ICD-10-CM | POA: Insufficient documentation

## 2013-07-27 DIAGNOSIS — F419 Anxiety disorder, unspecified: Secondary | ICD-10-CM | POA: Insufficient documentation

## 2013-07-27 DIAGNOSIS — Z8659 Personal history of other mental and behavioral disorders: Secondary | ICD-10-CM | POA: Insufficient documentation

## 2013-07-27 DIAGNOSIS — M1990 Unspecified osteoarthritis, unspecified site: Secondary | ICD-10-CM | POA: Insufficient documentation

## 2013-07-27 DIAGNOSIS — Z8739 Personal history of other diseases of the musculoskeletal system and connective tissue: Secondary | ICD-10-CM | POA: Insufficient documentation

## 2013-07-27 DIAGNOSIS — G8911 Acute pain due to trauma: Secondary | ICD-10-CM | POA: Insufficient documentation

## 2013-07-27 DIAGNOSIS — M542 Cervicalgia: Secondary | ICD-10-CM | POA: Insufficient documentation

## 2013-07-27 DIAGNOSIS — Z79899 Other long term (current) drug therapy: Secondary | ICD-10-CM | POA: Insufficient documentation

## 2013-07-27 DIAGNOSIS — R51 Headache: Secondary | ICD-10-CM | POA: Insufficient documentation

## 2013-07-27 MED ORDER — HYDROCODONE-ACETAMINOPHEN 5-325 MG PO TABS: 1.0000 | ORAL_TABLET | ORAL | Status: DC | PRN

## 2013-07-27 NOTE — ED Notes (Signed)
Pt. Fell last Saturday and fell onto her back.   She went to her Dr. Lovell Sheehan on Thursday and was diagnosed with a mild concussion.  She was instructed if she does not feel any better to come here.  She is sore on her lt. Head area. No visible marks noted.  Denies any blood thinners

## 2013-07-27 NOTE — ED Notes (Signed)
Discharge instructions reviewed with pt. Pt verbalized understanding.   

## 2013-07-27 NOTE — ED Provider Notes (Signed)
CSN: [id number]     Arrival date & time 07/27/13  0865 History   First MD Initiated Contact with Patient 07/27/13 [phone]     Chief Complaint  Patient presents with  . Head Injury   (Consider location/radiation/quality/duration/timing/severity/associated sxs/prior Treatment) HPI Comments: Doris Pacheco is a 72 y.o. female who complains of persistent pain, left head and left neck since a fall one week ago. She was injured when she slipped on a wet spot. She did not lose consciousness. She denies paresthesias, weakness, nausea, vomiting. She's using her usual medications, without relief. No prior head injuries. There are no other known modifying factors.   Patient is a 72 y.o. female presenting with head injury. The history is provided by the patient.  Head Injury   Past Medical History  Diagnosis Date  . Hypertension     takes meds daily  . Anxiety   . History of seasonal allergies   . Stroke     tia  . GERD (gastroesophageal reflux disease)   . Arthritis    Past Surgical History  Procedure Laterality Date  . Joint replacement      left hip, right knee  . Rotator cuff repair      bilateral  . Tonsillectomy    . Abdominal hysterectomy    . Intraocular lens insertion      right eye  . Patellectomy Right 10/26/2012    Procedure: PATELLECTOMY;  Surgeon: Nestor Lewandowsky, MD;  Location: [facility] OR;  Service: Orthopedics;  Laterality: Right;  . I&d knee with poly exchange Right 10/26/2012    Procedure: IRRIGATION AND DEBRIDEMENT KNEE WITH POLY EXCHANGE;  Surgeon: Nestor Lewandowsky, MD;  Location: [facility] OR;  Service: Orthopedics;  Laterality: Right;  poly exchange   Family History  Problem Relation Age of Onset  . Cancer Mother   . Diabetes Sister    History  Substance Use Topics  . Smoking status: Never Smoker   . Smokeless tobacco: Not on file  . Alcohol Use: No   OB History   Grav Para Term Preterm Abortions TAB SAB Ect Mult Living                 Review of Systems  All other systems  reviewed and are negative.    Allergies  Tape  Home Medications   Current Outpatient Rx  Name  Route  Sig  Dispense  Refill  . esomeprazole (NEXIUM) 40 MG capsule   Oral   Take 40 mg by mouth daily before breakfast.         . fish oil-omega-3 fatty acids 1000 MG capsule   Oral   Take 1 g by mouth daily.         . Meth-Hyo-M Bl-Na Phos-Ph Sal (URIBEL) 118 MG CAPS   Oral   Take 1 capsule by mouth daily.         Marland Kitchen triamterene-hydrochlorothiazide (MAXZIDE-25) 37.5-25 MG per tablet   Oral   Take 1 tablet by mouth daily.         . valsartan (DIOVAN) 320 MG tablet   Oral   Take 320 mg by mouth daily.          BP 148/59  Pulse 46  Temp(Src) 99.1 F (37.3 C) (Oral)  Resp 16  SpO2 100% Physical Exam  Nursing note and vitals reviewed. Constitutional: She is oriented to person, place, and time. She appears well-developed.  Elderly, robust  HENT:  Head: Normocephalic.  Mild tenderness. Left parietal  without swelling or crepitation.  Eyes: Conjunctivae and EOM are normal. Pupils are equal, round, and reactive to light.  Neck: Normal range of motion and phonation normal. Neck supple.  Cardiovascular: Normal rate, regular rhythm and intact distal pulses.   Pulmonary/Chest: Effort normal and breath sounds normal. She exhibits no tenderness.  Musculoskeletal: Normal range of motion.  Mild left lateral tenderness without swelling or deformity.  Neurological: She is alert and oriented to person, place, and time. She exhibits normal muscle tone.  Skin: Skin is warm and dry.  Psychiatric: She has a normal mood and affect. Her behavior is normal. Judgment and thought content normal.    ED Course  Procedures (including critical care time)    12:25 PM Reevaluation with update and discussion. After initial assessment and treatment, an updated evaluation reveals no change in status. She is comfortable, and has prn Norco at home. Doris Pacheco L     Labs Review Labs  Reviewed - No data to display Imaging Review Ct Head Wo Contrast  07/27/2013   CLINICAL DATA:  Head injury post fall.  EXAM: CT HEAD WITHOUT CONTRAST  CT CERVICAL SPINE WITHOUT CONTRAST  TECHNIQUE: Multidetector CT imaging of the head and cervical spine was performed following the standard protocol without intravenous contrast. Multiplanar CT image reconstructions of the cervical spine were also generated.  COMPARISON:  None.  FINDINGS: CT HEAD FINDINGS  Lateral ventricles are symmetrical mildly prominent. There is no evidence of focal mass, mass effect, shift of midline structures or acute hemorrhage. There is no evidence of acute infarction. There is mild atherosclerotic plaque over the cavernous segment of the internal carotid arteries. Remaining bones and soft tissues are unremarkable.  CT CERVICAL SPINE FINDINGS  Examination demonstrates reversal of the normal cervical lordosis. There is moderate to severe spondylosis throughout the cervical spine with multilevel disc space narrowing from the C3-4 level through the C6-7 level. There is no acute fracture or subluxation. Prevertebral soft tissues as well as the atlantoaxial articulation are within normal. There is moderate uncovertebral joint spurring and facet arthropathy. Neural foramen narrowing is present bilaterally at multiple levels over the mid to lower cervical spine. There is calcification at the carotid artery bifurcations. Remainder of the exam is unremarkable.  IMPRESSION: No acute intracranial findings. Minimal symmetric prominence of the lateral ventricles which may be due to atrophy versus normal pressure hydrocephalus.  No acute cervical spine injury.  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 Bilateral neural foraminal narrowing at multiple levels.   Electronically Signed   By: Elberta Fortis M.D.   On: 07/27/2013 10:12   Ct Cervical Spine Wo Contrast  07/27/2013   CLINICAL DATA:   Head injury post fall.  EXAM: CT HEAD WITHOUT CONTRAST  CT CERVICAL SPINE WITHOUT CONTRAST  TECHNIQUE: Multidetector CT imaging of the head and cervical spine was performed following the standard protocol without intravenous contrast. Multiplanar CT image reconstructions of the cervical spine were also generated.  COMPARISON:  None.  FINDINGS: CT HEAD FINDINGS  Lateral ventricles are symmetrical mildly prominent. There is no evidence of focal mass, mass effect, shift of midline structures or acute hemorrhage. There is no evidence of acute infarction. There is mild atherosclerotic plaque over the cavernous segment of the internal carotid arteries. Remaining bones and soft tissues are unremarkable.  CT CERVICAL SPINE FINDINGS  Examination demonstrates reversal of the normal cervical lordosis. There is moderate to severe spondylosis throughout the cervical spine with multilevel disc  space narrowing from the C3-4 level through the C6-7 level. There is no acute fracture or subluxation. Prevertebral soft tissues as well as the atlantoaxial articulation are within normal. There is moderate uncovertebral joint spurring and facet arthropathy. Neural foramen narrowing is present bilaterally at multiple levels over the mid to lower cervical spine. There is calcification at the carotid artery bifurcations. Remainder of the exam is unremarkable.  IMPRESSION: No acute intracranial findings. Minimal symmetric prominence of the lateral ventricles which may be due to atrophy versus normal pressure hydrocephalus.  No acute cervical spine injury.  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 Bilateral neural foraminal narrowing at multiple levels.   Electronically Signed   By: Elberta Fortis M.D.   On: 07/27/2013 10:12    EKG Interpretation   None       MDM   1. Head injury, initial encounter   2. Neck pain on left side   3. Degenerative joint disease    Subacute  injury with persistent pain and additional neck pain. Cervical spine has moderate degenerative disease. There is no evidence for acute intracranial injury, fracture, or significant nerve impingement. She is stable for discharge  Nursing Notes Reviewed/ Care Coordinated, and agree without changes. Applicable Imaging Reviewed.  Interpretation of Laboratory Data incorporated into ED treatment   Plan: Home Medications- usual; Home Treatments and Observation- heat treatments; return here if the recommended treatment, does not improve the symptoms; Recommended follow up- PCP, when necessary    Flint Melter, MD 07/27/13 1230

## 2013-07-27 NOTE — ED Notes (Signed)
Pt returned from radiology.

## 2013-07-30 ENCOUNTER — Ambulatory Visit (INDEPENDENT_AMBULATORY_CARE_PROVIDER_SITE_OTHER): Payer: Medicare Other | Admitting: Interventional Cardiology

## 2013-07-30 ENCOUNTER — Encounter: Payer: Self-pay | Admitting: Interventional Cardiology

## 2013-07-30 VITALS — BP 132/80 | HR 54 | Ht 67.0 in | Wt 167.0 lb

## 2013-07-30 DIAGNOSIS — R079 Chest pain, unspecified: Secondary | ICD-10-CM

## 2013-07-30 DIAGNOSIS — I6350 Cerebral infarction due to unspecified occlusion or stenosis of unspecified cerebral artery: Secondary | ICD-10-CM

## 2013-07-30 DIAGNOSIS — I10 Essential (primary) hypertension: Secondary | ICD-10-CM

## 2013-07-30 DIAGNOSIS — I639 Cerebral infarction, unspecified: Secondary | ICD-10-CM

## 2013-07-30 DIAGNOSIS — K219 Gastro-esophageal reflux disease without esophagitis: Secondary | ICD-10-CM

## 2013-07-30 NOTE — Patient Instructions (Signed)
Your physician recommends that you continue on your current medications as directed. Please refer to the Current Medication list given to you today.  Follow up as needed  

## 2013-07-30 NOTE — Progress Notes (Signed)
Patient ID: Doris Pacheco, female   DOB: 1940-10-11, 72 y.o.   MRN: [id number]    [address], [address] [place], [place]  [address] Phone: [phone] Fax:  [phone]  Date:  07/30/2013   ID:  Doris Pacheco, DOB May 26, 1941, MRN [id number]  PCP:  Ron Parker, MD   ASSESSMENT:  1. The lateral left chest discomfort upon awakening some mornings. It goes away once the patient is up and moving about. 2. Hypertension, controlled 3. Gastroesophageal reflux disease  PLAN:  1. No specific cardiac evaluation for the current complaints   SUBJECTIVE: Doris Pacheco is a 72 y.o. female is self-referred and has history of left chest discomfort. She describes the discomfort as a cramping tight pain that is present some mornings when she awakens. If she gets up and moves around the discomfort goes away. There no associated complaints. She denies peripheral edema. There are no palpitations. She denies orthopnea and PND. She has no documented history of heart disease. She states that this pain was concerning to her for heart related. She has no exertional component to her pain. She is active and denies limitations due to cardiopulmonary symptoms.   Wt Readings from Last 3 Encounters:  07/30/13 167 lb (75.751 kg)  10/23/12 172 lb 6.4 oz (78.2 kg)     Past Medical History  Diagnosis Date  . Hypertension     takes meds daily  . Anxiety   . History of seasonal allergies   . Stroke     tia  . GERD (gastroesophageal reflux disease)   . Arthritis     Current Outpatient Prescriptions  Medication Sig Dispense Refill  . atenolol (TENORMIN) 25 MG tablet Take 25 mg by mouth daily.      Marland Kitchen esomeprazole (NEXIUM) 40 MG capsule Take 40 mg by mouth daily before breakfast.      . fish oil-omega-3 fatty acids 1000 MG capsule Take 1 g by mouth daily.      Marland Kitchen FLUZONE HIGH-DOSE injection       . HYDROcodone-acetaminophen (NORCO) 5-325 MG per tablet Take 1 tablet by mouth every 4 (four) hours as  needed.  30 tablet  0  . meloxicam (MOBIC) 15 MG tablet Take 15 mg by mouth daily.      . Meth-Hyo-M Bl-Na Phos-Ph Sal (URIBEL) 118 MG CAPS Take 1 capsule by mouth daily.      . montelukast (SINGULAIR) 10 MG tablet Take 10 mg by mouth at bedtime.      . traMADol (ULTRAM) 50 MG tablet Take by mouth every 6 (six) hours as needed.      . triamterene-hydrochlorothiazide (MAXZIDE-25) 37.5-25 MG per tablet Take 1 tablet by mouth daily.      . valsartan (DIOVAN) 320 MG tablet Take 320 mg by mouth daily.      . vitamin C (ASCORBIC ACID) 500 MG tablet Take 500 mg by mouth daily.      Marland Kitchen VITAMIN E PO Take by mouth daily.       No current facility-administered medications for this visit.    Allergies:    Allergies  Allergen Reactions  . Tape Rash    History   Social History  . Marital Status: Single    Spouse Name: N/A    Number of Children: N/A  . Years of Education: N/A   Occupational History  . Not on file.   Social History Main Topics  . Smoking status: Never Smoker   . Smokeless  tobacco: Not on file  . Alcohol Use: No  . Drug Use: No  . Sexual Activity:    Other Topics Concern  . Not on file   Social History Narrative  . No narrative on file     ROS:  Please see the history of present illness.   Denies melena, blood in urine and stool, or wheezing, orthopnea, palpitations, and edema.   All other systems reviewed and negative.   OBJECTIVE: VS:  BP 132/80  Pulse 54  Ht 5\' 7"  (1.702 m)  Wt 167 lb (75.751 kg)  BMI 26.15 kg/m2  SpO2 98% Well nourished, well developed, in no acute distress, slender and a contusion of his stated age HEENT: normal Neck: JVD flat. Carotid bruit 2+  Cardiac:  normal S1, S2; RRR; no murmur Lungs:  clear to auscultation bilaterally, no wheezing, rhonchi or rales Abd: soft, nontender, no hepatomegaly Ext: Edema  None . Pulses 2+  Skin: warm and dry Neuro:  CNs 2-12 intact, no focal abnormalities noted  EKG:  Sinus bradycardia, atrial  abnormality, otherwise normal       Signed, Darci Needle III, MD 07/30/2013 9:54 AM

## 2014-04-02 ENCOUNTER — Encounter: Payer: Self-pay | Admitting: *Deleted

## 2014-05-27 ENCOUNTER — Ambulatory Visit (INDEPENDENT_AMBULATORY_CARE_PROVIDER_SITE_OTHER): Payer: Medicare HMO | Admitting: Podiatry

## 2014-05-27 ENCOUNTER — Encounter: Payer: Self-pay | Admitting: Podiatry

## 2014-05-27 ENCOUNTER — Ambulatory Visit (INDEPENDENT_AMBULATORY_CARE_PROVIDER_SITE_OTHER): Payer: Medicare HMO

## 2014-05-27 VITALS — BP 119/64 | HR 67 | Resp 16

## 2014-05-27 DIAGNOSIS — M2041 Other hammer toe(s) (acquired), right foot: Secondary | ICD-10-CM

## 2014-05-27 DIAGNOSIS — M2040 Other hammer toe(s) (acquired), unspecified foot: Secondary | ICD-10-CM

## 2014-05-27 DIAGNOSIS — M778 Other enthesopathies, not elsewhere classified: Secondary | ICD-10-CM

## 2014-05-27 DIAGNOSIS — M779 Enthesopathy, unspecified: Secondary | ICD-10-CM

## 2014-05-27 DIAGNOSIS — M7750 Other enthesopathy of unspecified foot: Secondary | ICD-10-CM

## 2014-05-27 NOTE — Progress Notes (Signed)
   Subjective:    Patient ID: Doris Pacheco, female    DOB: 1941-05-08, 73 y.o.   MRN: [id number]  HPI Comments: "I am having trouble with these toes"   Patient c/o aching 2nd toe right for several months. The toe is hammered and has a sore medially. The 2nd toe is rubbing against the 1st causing a sore on it too. She is keeping cotton and a padding on the toes.   Foot Pain      Review of Systems  HENT: Positive for sinus pressure.   All other systems reviewed and are negative.      Objective:   Physical Exam: I have reviewed her past medical history medications allergies surgeries social history and review of systems. Pulses are strongly palpable bilateral. Neurologic sensorium is intact per Semmes-Weinstein monofilament deep tendon reflexes are intact bilateral. Muscle strength is 5 over 5 dorsiflexors plantar flexors inverters everters all musculature is intact. Orthopedic evaluation demonstrates hammertoe deformity and HAV deformity is resulting in a bursitis and reactive capsulitis lateral aspect of the IP joint hallux right. Overlying reactive hyperkeratosis is present.          Assessment & Plan:  Assessment: Capsulitis IP joint hallux right. Porokeratosis hallux right.  Plan: Injected the area today with dexamethasone and local anesthetic and debrided the reactive hyperkeratosis. Placed padding and discussed appropriate shoe gear stretching exercises. Followup with her as needed.

## 2014-07-06 ENCOUNTER — Emergency Department (HOSPITAL_COMMUNITY)
Admission: EM | Admit: 2014-07-06 | Discharge: 2014-07-06 | Disposition: A | Discharge status: Home / Self Care | Payer: Medicare PPO | Attending: Emergency Medicine | Admitting: Emergency Medicine

## 2014-07-06 ENCOUNTER — Encounter (HOSPITAL_COMMUNITY): Payer: Self-pay | Admitting: Emergency Medicine

## 2014-07-06 DIAGNOSIS — Z79899 Other long term (current) drug therapy: Secondary | ICD-10-CM | POA: Insufficient documentation

## 2014-07-06 DIAGNOSIS — Z8673 Personal history of transient ischemic attack (TIA), and cerebral infarction without residual deficits: Secondary | ICD-10-CM | POA: Diagnosis not present

## 2014-07-06 DIAGNOSIS — M1990 Unspecified osteoarthritis, unspecified site: Secondary | ICD-10-CM | POA: Insufficient documentation

## 2014-07-06 DIAGNOSIS — I10 Essential (primary) hypertension: Secondary | ICD-10-CM | POA: Insufficient documentation

## 2014-07-06 DIAGNOSIS — F419 Anxiety disorder, unspecified: Secondary | ICD-10-CM | POA: Diagnosis not present

## 2014-07-06 DIAGNOSIS — M25552 Pain in left hip: Secondary | ICD-10-CM | POA: Diagnosis present

## 2014-07-06 DIAGNOSIS — K219 Gastro-esophageal reflux disease without esophagitis: Secondary | ICD-10-CM | POA: Insufficient documentation

## 2014-07-06 LAB — URINALYSIS, ROUTINE W REFLEX MICROSCOPIC
BILIRUBIN URINE: NEGATIVE
Glucose, UA: NEGATIVE mg/dL
Hgb urine dipstick: NEGATIVE
Ketones, ur: NEGATIVE mg/dL
LEUKOCYTES UA: NEGATIVE
Nitrite: NEGATIVE
PH: 6 (ref 5.0–8.0)
PROTEIN: NEGATIVE mg/dL
Specific Gravity, Urine: 1.022 (ref 1.005–1.030)
Urobilinogen, UA: 0.2 mg/dL (ref 0.0–1.0)

## 2014-07-06 MED ORDER — KETOROLAC TROMETHAMINE 15 MG/ML IJ SOLN
15.0000 mg | Freq: Once | INTRAMUSCULAR | Status: DC
Start: 2014-07-06 — End: 2014-07-06
  Filled 2014-07-06: qty 1

## 2014-07-06 MED ORDER — HYDROCODONE-ACETAMINOPHEN 5-325 MG PO TABS
1.0000 | ORAL_TABLET | Freq: Once | ORAL | Status: AC
  Administered 2014-07-06: 1 via ORAL
  Filled 2014-07-06: qty 1

## 2014-07-06 MED ORDER — KETOROLAC TROMETHAMINE 15 MG/ML IJ SOLN
15.0000 mg | Freq: Once | INTRAMUSCULAR | Status: AC
  Administered 2014-07-06: 15 mg via INTRAMUSCULAR

## 2014-07-06 NOTE — Discharge Instructions (Signed)
Hip Pain Your hip is the joint between your upper legs and your lower pelvis. The bones, cartilage, tendons, and muscles of your hip joint perform a lot of work each day supporting your body weight and allowing you to move around. Hip pain can range from a minor ache to severe pain in one or both of your hips. Pain may be felt on the inside of the hip joint near the groin, or the outside near the buttocks and upper thigh. You may have swelling or stiffness as well.  HOME CARE INSTRUCTIONS   Take medicines only as directed by your health care provider.  Apply ice to the injured area:  Put ice in a plastic bag.  Place a towel between your skin and the bag.  Leave the ice on for 15-20 minutes at a time, 3-4 times a day.  Keep your leg raised (elevated) when possible to lessen swelling.  Avoid activities that cause pain.  Follow specific exercises as directed by your health care provider.  Sleep with a pillow between your legs on your most comfortable side.  Record how often you have hip pain, the location of the pain, and what it feels like. SEEK MEDICAL CARE IF:   You are unable to put weight on your leg.  Your hip is red or swollen or very tender to touch.  Your pain or swelling continues or worsens after 1 week.  You have increasing difficulty walking.  You have a fever. SEEK IMMEDIATE MEDICAL CARE IF:   You have fallen.  You have a sudden increase in pain and swelling in your hip. MAKE SURE YOU:   Understand these instructions.  Will watch your condition.  Will get help right away if you are not doing well or get worse. Document Released: 02/23/2010 Document Revised: 01/20/2014 Document Reviewed: 05/02/2013 [employer] Patient Information 2015 [employer]. This information is not intended to replace advice given to you by your health care provider. Make sure you discuss any questions you have with your health care provider.  

## 2014-07-06 NOTE — ED Notes (Addendum)
Pt. Reports that "I am not getting a xray. I'm just getting a shot and going home"

## 2014-07-06 NOTE — ED Provider Notes (Signed)
CSN: [id number]     Arrival date & time 07/06/14  1020 History   First MD Initiated Contact with Patient 07/06/14 1141     Chief Complaint  Patient presents with  . Hip Pain     (Consider location/radiation/quality/duration/timing/severity/associated sxs/prior Treatment) HPI Comments: 73 year old female with a history of left hip pain, status post remote left hip replacement, who presents with worsening of her left hip pain over the past week. She saw her orthopedist 3 days ago and received an injection to her left hip.  She does not think that the injection helped her pain.  Patient is a 73 y.o. female presenting with hip pain.  Hip Pain This is a recurrent problem. Episode onset: 1 week ago. The problem occurs constantly. The problem has been gradually worsening. Pertinent negatives include no abdominal pain. Associated symptoms comments: No numbness, no weakness (other than baseline). The symptoms are aggravated by walking. The symptoms are relieved by rest. Treatments tried: tylenol  The treatment provided no relief.    Past Medical History  Diagnosis Date  . Hypertension     takes meds daily  . Anxiety   . History of seasonal allergies   . Stroke     tia  . GERD (gastroesophageal reflux disease)   . Arthritis    Past Surgical History  Procedure Laterality Date  . Joint replacement      left hip, right knee  . Rotator cuff repair      bilateral  . Tonsillectomy    . Abdominal hysterectomy    . Intraocular lens insertion      right eye  . Patellectomy Right 10/26/2012    Procedure: PATELLECTOMY;  Surgeon: Nestor LewandowskyFrank J Rowan, MD;  [facility];  Service: Orthopedics;  Laterality: Right;  . I&d knee with poly exchange Right 10/26/2012    Procedure: IRRIGATION AND DEBRIDEMENT KNEE WITH POLY EXCHANGE;  Surgeon: Nestor LewandowskyFrank J Rowan, MD;  Location: [facility] OR;  Service: Orthopedics;  Laterality: Right;  poly exchange   Family History  Problem Relation Age of Onset  . Cancer Mother   .  Diabetes Sister    History  Substance Use Topics  . Smoking status: Never Smoker   . Smokeless tobacco: Not on file  . Alcohol Use: No   OB History   Grav Para Term Preterm Abortions TAB SAB Ect Mult Living                 Review of Systems  Gastrointestinal: Negative for abdominal pain.  All other systems reviewed and are negative.     Allergies  Tape  Home Medications   Prior to Admission medications   Medication Sig Start Date End Date Taking? Authorizing Provider  atenolol (TENORMIN) 25 MG tablet Take 25 mg by mouth daily.   Yes Historical Provider, MD  esomeprazole (NEXIUM) 40 MG capsule Take 40 mg by mouth daily before breakfast.   Yes Historical Provider, MD  HYDROcodone-acetaminophen (NORCO) 5-325 MG per tablet Take 1 tablet by mouth every 4 (four) hours as needed. 07/27/13  Yes Flint MelterElliott L Wentz, MD  Meth-Hyo-M Salley HewsBl-Na Phos-Ph Sal (URIBEL) 118 MG CAPS Take 1 capsule by mouth daily.   Yes Historical Provider, MD  montelukast (SINGULAIR) 10 MG tablet Take 10 mg by mouth at bedtime.   Yes Historical Provider, MD  traMADol (ULTRAM) 50 MG tablet Take by mouth every 6 (six) hours as needed.   Yes Historical Provider, MD  triamterene-hydrochlorothiazide (MAXZIDE-25) 37.5-25 MG per tablet Take 1 tablet  by mouth daily.   Yes Historical Provider, MD  valsartan (DIOVAN) 320 MG tablet Take 320 mg by mouth daily.   Yes Historical Provider, MD  vitamin C (ASCORBIC ACID) 500 MG tablet Take 500 mg by mouth daily.   Yes Historical Provider, MD  VITAMIN E PO Take by mouth daily.   Yes Historical Provider, MD   BP 194/69  Pulse 56  Temp(Src) 98.1 F (36.7 C) (Oral)  Resp 15  SpO2 100% Physical Exam  Nursing note and vitals reviewed. Constitutional: She is oriented to person, place, and time. She appears well-developed and well-nourished. No distress.  HENT:  Head: Normocephalic and atraumatic.  Eyes: Conjunctivae are normal. No scleral icterus.  Neck: Neck supple.  Cardiovascular:  Normal rate and intact distal pulses.   Pulmonary/Chest: Effort normal. No stridor. No respiratory distress.  Abdominal: Normal appearance. She exhibits no distension.  Musculoskeletal:       Left hip: She exhibits tenderness. She exhibits normal range of motion (no pain with range of motion, active or passive), normal strength (Chronic hammertoe), no swelling, no crepitus and no deformity.       Legs: Neurological: She is alert and oriented to person, place, and time.  Skin: Skin is warm and dry. No rash noted.  Psychiatric: She has a normal mood and affect. Her behavior is normal.    ED Course  Procedures (including critical care time) Labs Review Labs Reviewed  URINALYSIS, ROUTINE W REFLEX MICROSCOPIC - Abnormal; Notable for the following:    Color, Urine GREEN (*)    All other components within normal limits    Imaging Review No results found.   EKG Interpretation None      MDM   Final diagnoses:  Left hip pain    73 year old female with a history of chronic left hip pain who presents with the same. No signs of infection. She is well appearing and nontoxic. She denies any injuries. I suspect a flareup of her chronic left hip pain.  Pain resolved after ED treatments.  Advised she take limited short term course of NSAIDs and continue tylenol.  She will follow up with her PCP and orthopedist.    Warnell Foresterrey Sujey Gundry, MD 07/07/14 1248

## 2014-07-06 NOTE — ED Notes (Signed)
Pt c/o left hip pain; pt sts hx of hip replacement; pt denies obvious injury; pt sts started hurting x 1 week intermittently

## 2014-07-06 NOTE — ED Notes (Signed)
Pt undressed, in gown, on monitor, continuous pulse oximetry and blood pressure cuff; [place], MD speaking to pt

## 2014-10-13 ENCOUNTER — Other Ambulatory Visit: Payer: Self-pay | Admitting: Gastroenterology

## 2014-10-13 DIAGNOSIS — R109 Unspecified abdominal pain: Secondary | ICD-10-CM

## 2014-10-17 ENCOUNTER — Ambulatory Visit
Admission: RE | Admit: 2014-10-17 | Discharge: 2014-10-17 | Disposition: A | Discharge status: Home / Self Care | Payer: Medicare HMO | Source: Ambulatory Visit | Attending: Gastroenterology | Admitting: Gastroenterology

## 2014-10-17 DIAGNOSIS — R109 Unspecified abdominal pain: Secondary | ICD-10-CM

## 2015-01-08 ENCOUNTER — Encounter: Payer: Self-pay | Admitting: Gynecology

## 2015-01-08 ENCOUNTER — Ambulatory Visit (INDEPENDENT_AMBULATORY_CARE_PROVIDER_SITE_OTHER): Payer: Medicare HMO | Admitting: Gynecology

## 2015-01-08 VITALS — BP 140/86 | Ht 65.0 in | Wt 179.0 lb

## 2015-01-08 DIAGNOSIS — Z780 Asymptomatic menopausal state: Secondary | ICD-10-CM

## 2015-01-08 DIAGNOSIS — M545 Low back pain, unspecified: Secondary | ICD-10-CM | POA: Insufficient documentation

## 2015-01-08 DIAGNOSIS — Z01419 Encounter for gynecological examination (general) (routine) without abnormal findings: Secondary | ICD-10-CM

## 2015-01-08 NOTE — Progress Notes (Signed)
Doris Pacheco 03/13/1941 [id number]   History:    74 y.o.  for annual gyn exam who is a new patient to the practice. Patient has not had a gynecological exam in many years. Her PCP Dr. Harrold DonathMorreira has been doing her blood work. Patient states that many years ago she had a total abdominal hysterectomy with ovarian conservation as a result of symptomatic leiomyomatous uteri. She reports colonoscopy in 2013 with removal of benign polyps and she is on a 5 year recall. Her last bone density study was over 5 years ago no report available. She denies any past history of any abnormal Pap smear. She had been on HRT consisting of Premarin back in the 90s. Patient denies any vaginal bleeding. Patient denies any past history of any abnormal Pap smears. She is currently being followed by the orthopedic surgeon and receiving injections because of back pains.  Past medical history,surgical history, family history and social history were all reviewed and documented in the EPIC chart.  Gynecologic History No LMP recorded. Patient is postmenopausal. Contraception: status post hysterectomy Last Pap: 2014. Results were: normal Last mammogram: August 2015. Results were: normal  Obstetric History OB History  Gravida Para Term Preterm AB SAB TAB Ectopic Multiple Living  3 3        3     # Outcome Date GA Lbr Len/2nd Weight Sex Delivery Anes PTL Lv  3 Para           2 Para           1 Para                ROS: A ROS was performed and pertinent positives and negatives are included in the history.  GENERAL: No fevers or chills. HEENT: No change in vision, no earache, sore throat or sinus congestion. NECK: No pain or stiffness. CARDIOVASCULAR: No chest pain or pressure. No palpitations. PULMONARY: No shortness of breath, cough or wheeze. GASTROINTESTINAL: No abdominal pain, nausea, vomiting or diarrhea, melena or bright red blood per rectum. GENITOURINARY: No urinary frequency, urgency, hesitancy or dysuria.  MUSCULOSKELETAL: No joint or muscle pain, no back pain, no recent trauma. DERMATOLOGIC: No rash, no itching, no lesions. ENDOCRINE: No polyuria, polydipsia, no heat or cold intolerance. No recent change in weight. HEMATOLOGICAL: No anemia or easy bruising or bleeding. NEUROLOGIC: No headache, seizures, numbness, tingling or weakness. PSYCHIATRIC: No depression, no loss of interest in normal activity or change in sleep pattern.     Exam: chaperone present  BP 140/86 mmHg  Ht 5\' 5"  (1.651 m)  Wt 179 lb (81.194 kg)  BMI 29.79 kg/m2  Body mass index is 29.79 kg/(m^2).  General appearance : Well developed well nourished female. No acute distress HEENT: Eyes: no retinal hemorrhage or exudates,  Neck supple, trachea midline, no carotid bruits, no thyroidmegaly Lungs: Clear to auscultation, no rhonchi or wheezes, or rib retractions  Heart: Regular rate and rhythm, no murmurs or gallops Breast:Examined in sitting and supine position were symmetrical in appearance, no palpable masses or tenderness,  no skin retraction, no nipple inversion, no nipple discharge, no skin discoloration, no axillary or supraclavicular lymphadenopathy Abdomen: no palpable masses or tenderness, no rebound or guarding Extremities: no edema or skin discoloration or tenderness  Pelvic:  Bartholin, Urethra, Skene Glands: Within normal limits             Vagina: No gross lesions or discharge, atrophic changes  Cervix: Absent  Uterus  absent  Adnexa  Without masses or tenderness  Anus and perineum  normal   Rectovaginal  normal sphincter tone without palpated masses or tenderness             Hemoccult PCP provides     Assessment/Plan:  74 y.o. female for annual exam will return back to the office for a bone density study along with a VFA because of back pains and being postmenopausal. Patient on no hormone replacement therapy. Pap smear not done no longer recommended according to the new guidelines. Patient with no past  history of abnormal Pap smears. PCP we'll be doing her blood work in the next few weeks. Mammogram due end of this year. Patient was reminded on the importance of calcium vitamin D and weightbearing exercises for osteoporosis prevention. We discussed importance of monthly self breast exams.   Ok Edwards MD, 12:49 PM 01/08/2015

## 2015-01-08 NOTE — Patient Instructions (Signed)

## 2015-01-20 ENCOUNTER — Ambulatory Visit (INDEPENDENT_AMBULATORY_CARE_PROVIDER_SITE_OTHER): Payer: Medicare HMO

## 2015-01-20 ENCOUNTER — Other Ambulatory Visit: Payer: Self-pay | Admitting: Gynecology

## 2015-01-20 DIAGNOSIS — M545 Low back pain, unspecified: Secondary | ICD-10-CM

## 2015-01-20 DIAGNOSIS — Z780 Asymptomatic menopausal state: Secondary | ICD-10-CM | POA: Diagnosis not present

## 2015-01-20 DIAGNOSIS — M810 Age-related osteoporosis without current pathological fracture: Secondary | ICD-10-CM

## 2015-02-02 ENCOUNTER — Telehealth: Payer: Self-pay | Admitting: *Deleted

## 2015-02-02 NOTE — Telephone Encounter (Signed)
-----   Message from Ok EdwardsJuan H Fernandez, MD sent at 02/02/2015  4:03 PM EDT ----- Please call patient and tell her that I need to see her in consultation to discuss her bone density. Tell her that I spoke with her orthopedic surgeon and he agrees that we should begin treatment which we will discuss at the consultation appointment with me in the next few weeks.

## 2015-02-02 NOTE — Telephone Encounter (Signed)
Pt informed with the below note, transferred to front desk.  

## 2015-02-05 ENCOUNTER — Other Ambulatory Visit: Payer: Self-pay | Admitting: Gynecology

## 2015-02-05 DIAGNOSIS — M545 Low back pain, unspecified: Secondary | ICD-10-CM

## 2015-02-05 DIAGNOSIS — Z780 Asymptomatic menopausal state: Secondary | ICD-10-CM

## 2015-02-05 DIAGNOSIS — M810 Age-related osteoporosis without current pathological fracture: Secondary | ICD-10-CM

## 2015-02-18 ENCOUNTER — Ambulatory Visit (INDEPENDENT_AMBULATORY_CARE_PROVIDER_SITE_OTHER): Payer: Medicare HMO | Admitting: Gynecology

## 2015-02-18 ENCOUNTER — Encounter: Payer: Self-pay | Admitting: Gynecology

## 2015-02-18 VITALS — BP 140/86

## 2015-02-18 DIAGNOSIS — M545 Low back pain, unspecified: Secondary | ICD-10-CM | POA: Insufficient documentation

## 2015-02-18 DIAGNOSIS — M8088XA Other osteoporosis with current pathological fracture, vertebra(e), initial encounter for fracture: Secondary | ICD-10-CM

## 2015-02-18 DIAGNOSIS — M810 Age-related osteoporosis without current pathological fracture: Secondary | ICD-10-CM | POA: Diagnosis not present

## 2015-02-18 DIAGNOSIS — M8008XA Age-related osteoporosis with current pathological fracture, vertebra(e), initial encounter for fracture: Secondary | ICD-10-CM

## 2015-02-18 NOTE — Patient Instructions (Signed)
Teriparatide injection What is this medicine? TERIPARATIDE (terr ih PAR a tyd) increases bone mass and strength. It helps make healthy bone and to slow bone loss. This medicine is used to prevent bone fractures. This medicine may be used for other purposes; ask your health care provider or pharmacist if you have questions. COMMON BRAND NAME(S): Forteo What should I tell my health care provider before I take this medicine? They need to know if you have any of these conditions: -bone disease other than osteoporosis -heart, kidney or liver disease -history of cancer in the bone -kidney stone -Paget's disease -parathyroid disease -receiving radiation therapy -an unusual or allergic reaction to teriparatide, other medicines, foods, dyes, or preservatives -pregnant or trying to get pregnant -breast-feeding How should I use this medicine? This medicine comes in an injection pen device. This pen injects the medicine under your skin. Follow the directions on the prescription label. You will be taught how to use this medicine. Take your medicine at regular intervals. Do not take your medicine more often than directed. Do not use this medicine if it has solid particles in it, or if it is cloudy or colored. It should be clear and colorless. Be sure that you are using your pen device correctly. A special MedGuide will be given to you by the pharmacist with each prescription and refill. Be sure to read this information carefully each time. Talk to your pediatrician regarding the use of this medicine in children. Special care may be needed. Overdosage: If you think you have taken too much of this medicine contact a poison control center or emergency room at once. NOTE: This medicine is only for you. Do not share this medicine with others. What if I miss a dose? If you miss a dose, take it as soon as you can. If it is almost time for your next dose, take only that dose. Do not take double or extra  doses. What may interact with this medicine? -digoxin -other medicines to strengthen bone This list may not describe all possible interactions. Give your health care provider a list of all the medicines, herbs, non-prescription drugs, or dietary supplements you use. Also tell them if you smoke, drink alcohol, or use illegal drugs. Some items may interact with your medicine. What should I watch for while using this medicine? Visit your doctor or health care professional for regular checks on your progress. Your doctor may order blood tests and other tests to see how you are doing. You should make sure you get enough calcium and vitamin D while you are taking this medicine, unless your doctor tells you not to. Discuss the foods you eat and the vitamins you take with your health care professional. You may get drowsy or dizzy. Do not drive, use machinery, or do anything that needs mental alertness until you know how this medicine affects you. Do not stand or sit up quickly, especially if you are an older patient. This reduces the risk of dizzy or fainting spells. What side effects may I notice from receiving this medicine? Side effects that you should report to your doctor or health care professional as soon as possible: -allergic reactions like skin rash, itching or hives, swelling of the face, lips, or tongue -chronic constipation -high blood pressure -irregular heartbeat, chest pain -nausea, vomiting -unusually weak or tired Side effects that usually do not require medical attention (report to your doctor or health care professional if they continue or are bothersome): -bone pain -cough, runny nose -  headache -leg cramps -muscle spasms in the back or legs -pain, redness, irritation or swelling at the injection site -stomach upset -trouble sleeping This list may not describe all possible side effects. Call your doctor for medical advice about side effects. You may report side effects to FDA at  1-800-FDA-1088. Where should I keep my medicine? Keep out of the reach of children. Store in a refrigerator between 2 and 8 degrees C (36 and 46 degrees F). Do not freeze. Recap the pen injector when not in use to protect it from light and damage. Use quickly after taking out of the refrigerator and return to refrigerator right after using. Throw away any unused medicine 28 days after the first injection from the device. Do not use after the expiration date printed on the pen and pen packaging. NOTE: This sheet is a summary. It may not cover all possible information. If you have questions about this medicine, talk to your doctor, pharmacist, or health care provider.  2015, Elsevier/Gold Standard. (2008-09-02 17:45:37) Osteoporosis Throughout your life, your body breaks down old bone and replaces it with new bone. As you get older, your body does not replace bone as quickly as it breaks it down. By the age of 30 years, most people begin to gradually lose bone because of the imbalance between bone loss and replacement. Some people lose more bone than others. Bone loss beyond a specified normal degree is considered osteoporosis.  Osteoporosis affects the strength and durability of your bones. The inside of the ends of your bones and your flat bones, like the bones of your pelvis, look like honeycomb, filled with tiny open spaces. As bone loss occurs, your bones become less dense. This means that the open spaces inside your bones become bigger and the walls between these spaces become thinner. This makes your bones weaker. Bones of a person with osteoporosis can become so weak that they can break (fracture) during minor accidents, such as a simple fall. CAUSES  The following factors have been associated with the development of osteoporosis:  Smoking.  Drinking more than 2 alcoholic drinks several days per week.  Long-term use of certain medicines:  Corticosteroids.  Chemotherapy  medicines.  Thyroid medicines.  Antiepileptic medicines.  Gonadal hormone suppression medicine.  Immunosuppression medicine.  Being underweight.  Lack of physical activity.  Lack of exposure to the sun. This can lead to vitamin D deficiency.  Certain medical conditions:  Certain inflammatory bowel diseases, such as Crohn disease and ulcerative colitis.  Diabetes.  Hyperthyroidism.  Hyperparathyroidism. RISK FACTORS Anyone can develop osteoporosis. However, the following factors can increase your risk of developing osteoporosis:  Gender--Women are at higher risk than men.  Age--Being older than 50 years increases your risk.  Ethnicity--White and Asian people have an increased risk.  Weight --Being extremely underweight can increase your risk of osteoporosis.  Family history of osteoporosis--Having a family member who has developed osteoporosis can increase your risk. SYMPTOMS  Usually, people with osteoporosis have no symptoms.  DIAGNOSIS  Signs during a physical exam that may prompt your caregiver to suspect osteoporosis include:  Decreased height. This is usually caused by the compression of the bones that form your spine (vertebrae) because they have weakened and become fractured.  A curving or rounding of the upper back (kyphosis). To confirm signs of osteoporosis, your caregiver may request a procedure that uses 2 low-dose X-ray beams with different levels of energy to measure your bone mineral density (dual-energy X-ray absorptiometry [DXA]). Also, your   caregiver may check your level of vitamin D. TREATMENT  The goal of osteoporosis treatment is to strengthen bones in order to decrease the risk of bone fractures. There are different types of medicines available to help achieve this goal. Some of these medicines work by slowing the processes of bone loss. Some medicines work by increasing bone density. Treatment also involves making sure that your levels of calcium  and vitamin D are adequate. PREVENTION  There are things you can do to help prevent osteoporosis. Adequate intake of calcium and vitamin D can help you achieve optimal bone mineral density. Regular exercise can also help, especially resistance and weight-bearing activities. If you smoke, quitting smoking is an important part of osteoporosis prevention. MAKE SURE YOU:  Understand these instructions.  Will watch your condition.  Will get help right away if you are not doing well or get worse. FOR MORE INFORMATION www.osteo.org and www.nof.org Document Released: 06/15/2005 Document Revised: 12/31/2012 Document Reviewed: 08/20/2011 ExitCare Patient Information 2015 [employer]. This information is not intended to replace advice given to you by your health care provider. Make sure you discuss any questions you have with your health care provider.  

## 2015-02-18 NOTE — Progress Notes (Signed)
   Patient is a 74 year old who was seen in the office for the first time as a new patient on 01/08/2015. She is here today to discuss her recent bone density study done here in our office. She had mentioned that her last bone density study was over 5 years ago in no reports were available for comparison. She's currently on no hormone replacement therapy. She is been working with the orthopedic surgeon  ais a result of her chronic back pain. She is also been receiving injections periodically for her back pain. Patient does have a history of a left hip fracture in 1998. I had reviewed [facility] 2 [facility] whereby was described the patient had an anterior vertebral compression fracture at T12.  [facility] demonstrated the following:  Lowest T score right femoral neck -1.3 Frax analysis: 4.6/0.7  Vertebral fracture analysis demonstrated wedge fracture at T11 and T12  Calcium, vitamin D, and PTH levels were drawn today.  Assessment/plan: #1 postmenopausal with past history of right hip fracture 1998 #2 normal bone density study but [facility] indicating wedge fracture at T11 and T12 and according to the [facility] patient with vertebral fracture regardless of peripheral T scores is classified as osteoporosis. I have spoken with orthopedic surgeon about starting the patient on Forteo which I have provided her with literature information. We'll check with her insurance so she can begin daily injections for 2 years. We discussed the side effects to include osteonecrosis of the jaw, osteosarcoma, and spontaneous subtrochanteric fractures. We will then transition her after the 2 years to monoclonal antibody such as per liter every 6 months injection. Her calcium, vitamin D and PTH levels were drawn today. Literature information was provided. All questions are answered we'll follow accordingly. Greater than 50%  of the time was spent in counseling correlation of care with this patient. Total time of consultation 30 minutes.

## 2015-02-24 ENCOUNTER — Telehealth: Payer: Self-pay | Admitting: Gynecology

## 2015-02-24 DIAGNOSIS — M810 Age-related osteoporosis without current pathological fracture: Secondary | ICD-10-CM

## 2015-02-24 MED ORDER — TERIPARATIDE (RECOMBINANT) 600 MCG/2.4ML ~~LOC~~ SOLN
20.0000 ug | SUBCUTANEOUS | Status: DC
Start: 2015-02-24 — End: 2015-07-25

## 2015-02-24 MED ORDER — TERIPARATIDE (RECOMBINANT) 600 MCG/2.4ML ~~LOC~~ SOLN: 20.0000 ug | SUBCUTANEOUS | Status: DC

## 2015-02-24 NOTE — Telephone Encounter (Signed)
Note From Dr Lily PeerFernandez. Pam, please make arrangements for this patient to begin receiving Forteo. Patient with past history of hip fracture several years ago and here in our office recent bone density study/VFA demonstrated 2 vertebral fractures. Patient had her calcium, vitamin D and PTH levels drawn today. I have provided her with literature information to read. 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   Order for [facility] . Will instruct patient to call office to set up appointment for instructions in administration of Forteo.

## 2015-02-24 NOTE — Telephone Encounter (Signed)
[facility]. Explained to pt. That we will wait on response from mail order pharmacy . She will schedule appointment for instructions for injections.

## 2015-02-26 ENCOUNTER — Ambulatory Visit: Payer: TRICARE For Life (TFL) | Admitting: Gynecology

## 2015-03-03 NOTE — Telephone Encounter (Signed)
Patient called regarding Forteo injection , she has a co pay of $20 . She will call when medication comes in mail and schedule a time to come into office for instruction administration of injection. She has finished her physical therapy and will also call Dr Ludwig Clarks to fax recent lab work results to Dr Lily Peer.

## 2015-03-04 NOTE — Telephone Encounter (Signed)
Great thank you!

## 2015-03-09 ENCOUNTER — Other Ambulatory Visit: Payer: Self-pay

## 2015-03-09 MED ORDER — INSULIN PEN NEEDLE 31G X 8 MM MISC: Status: DC

## 2015-03-09 NOTE — Telephone Encounter (Signed)
Lab work received from Dr Ralene Ok (PCP) , Calcium 9.0 02/18/2015. Patient has received medication (Forteo) . She will come in at 2pm on 03/10/15 for instructions for injection. She will need needles for pen, no needles were provided from [facility]. She also stated that Dr Cleophas Dunker is her orthopedic physician and requested that Dr Lily Peer keep Dr Cleophas Dunker informed regarding her treatment.

## 2015-03-10 ENCOUNTER — Ambulatory Visit: Payer: Medicare HMO

## 2015-03-12 ENCOUNTER — Telehealth: Payer: Self-pay | Admitting: Gynecology

## 2015-03-12 DIAGNOSIS — M810 Age-related osteoporosis without current pathological fracture: Secondary | ICD-10-CM

## 2015-03-12 NOTE — Telephone Encounter (Addendum)
Phone call from pt c/o diarrhea she question if this is a side effect from [place]. I talked with Maryelizabeth Rowan . She advised that patient stop Forteo for several days and wait for diarrhea to stop then restart Forteo injections . If the diarrhea re occurs to call office for directions regarding medication.

## 2015-03-17 NOTE — Telephone Encounter (Signed)
Telephone call, states diarrhea/loose stools have stop. Saw her primary care, blood pressure was low is now taking Forteo every other day and better.

## 2015-03-24 NOTE — Telephone Encounter (Addendum)
Phone call from patient regarding her Forteo on 03/12/15 c/o diarrhea, sent to Maryelizabeth RowanNancy Young, NP , instructed pt to stop Forteo until diarrhea stops. On 03/17/15 Harriett SineNancy did follow up with patient regarding Forteo. Instructed pt to take injections every other day and report back if diarrhea continues. Patient did contact PCP also per Nancy's note.

## 2015-04-01 MED ORDER — INSULIN PEN NEEDLE 31G X 8 MM MISC: Status: DC

## 2015-04-01 NOTE — Telephone Encounter (Signed)
Phone call to patient informing her of note from Dr Lily PeerFernandez. Also did verbal order for needles ( Forteo pen) to [facility].

## 2015-04-01 NOTE — Telephone Encounter (Signed)
Patient is now taking Forteo injections every other day per Dr Alice ReichertBassett(ortho). Diarrhea has stopped. Patient now has question about when to start injections daily or continue every other day. She is currently in PT.

## 2015-04-01 NOTE — Telephone Encounter (Signed)
She can continue with injections every other day so she does not get the diarrhea

## 2015-06-02 ENCOUNTER — Other Ambulatory Visit (HOSPITAL_COMMUNITY): Payer: Self-pay | Admitting: Internal Medicine

## 2015-06-02 DIAGNOSIS — R0602 Shortness of breath: Secondary | ICD-10-CM

## 2015-06-02 DIAGNOSIS — I158 Other secondary hypertension: Secondary | ICD-10-CM

## 2015-06-04 ENCOUNTER — Other Ambulatory Visit: Payer: Self-pay

## 2015-06-04 ENCOUNTER — Ambulatory Visit (HOSPITAL_COMMUNITY): Payer: Medicare HMO | Attending: Cardiology

## 2015-06-04 DIAGNOSIS — I10 Essential (primary) hypertension: Secondary | ICD-10-CM | POA: Diagnosis not present

## 2015-06-04 DIAGNOSIS — I158 Other secondary hypertension: Secondary | ICD-10-CM | POA: Diagnosis not present

## 2015-06-04 DIAGNOSIS — R0602 Shortness of breath: Secondary | ICD-10-CM | POA: Insufficient documentation

## 2015-06-04 DIAGNOSIS — I517 Cardiomegaly: Secondary | ICD-10-CM | POA: Diagnosis not present

## 2015-06-04 DIAGNOSIS — Z8673 Personal history of transient ischemic attack (TIA), and cerebral infarction without residual deficits: Secondary | ICD-10-CM | POA: Diagnosis not present

## 2015-07-25 ENCOUNTER — Emergency Department (HOSPITAL_COMMUNITY)
Admission: EM | Admit: 2015-07-25 | Discharge: 2015-07-25 | Disposition: A | Discharge status: Home / Self Care | Payer: Medicare HMO | Attending: Emergency Medicine | Admitting: Emergency Medicine

## 2015-07-25 ENCOUNTER — Encounter (HOSPITAL_COMMUNITY): Payer: Self-pay | Admitting: Emergency Medicine

## 2015-07-25 ENCOUNTER — Emergency Department (HOSPITAL_COMMUNITY): Payer: Medicare HMO

## 2015-07-25 DIAGNOSIS — I10 Essential (primary) hypertension: Secondary | ICD-10-CM | POA: Insufficient documentation

## 2015-07-25 DIAGNOSIS — R0600 Dyspnea, unspecified: Secondary | ICD-10-CM | POA: Insufficient documentation

## 2015-07-25 DIAGNOSIS — M1990 Unspecified osteoarthritis, unspecified site: Secondary | ICD-10-CM | POA: Insufficient documentation

## 2015-07-25 DIAGNOSIS — F419 Anxiety disorder, unspecified: Secondary | ICD-10-CM | POA: Diagnosis not present

## 2015-07-25 DIAGNOSIS — Z8673 Personal history of transient ischemic attack (TIA), and cerebral infarction without residual deficits: Secondary | ICD-10-CM | POA: Diagnosis not present

## 2015-07-25 DIAGNOSIS — K219 Gastro-esophageal reflux disease without esophagitis: Secondary | ICD-10-CM | POA: Diagnosis not present

## 2015-07-25 DIAGNOSIS — Z79899 Other long term (current) drug therapy: Secondary | ICD-10-CM | POA: Diagnosis not present

## 2015-07-25 DIAGNOSIS — R079 Chest pain, unspecified: Secondary | ICD-10-CM | POA: Insufficient documentation

## 2015-07-25 LAB — I-STAT TROPONIN, ED: TROPONIN I, POC: 0.01 ng/mL (ref 0.00–0.08)

## 2015-07-25 LAB — BASIC METABOLIC PANEL
Anion gap: 9 (ref 5–15)
BUN: 24 mg/dL — AB (ref 6–20)
CHLORIDE: 108 mmol/L (ref 101–111)
CO2: 20 mmol/L — ABNORMAL LOW (ref 22–32)
Calcium: 9.3 mg/dL (ref 8.9–10.3)
Creatinine, Ser: 0.8 mg/dL (ref 0.44–1.00)
GFR calc Af Amer: >60 mL/min (ref >60)
GLUCOSE: 97 mg/dL (ref 65–99)
POTASSIUM: 3.6 mmol/L (ref 3.5–5.1)
Sodium: 137 mmol/L (ref 135–145)

## 2015-07-25 LAB — CBC
HCT: 36.5 % (ref 36.0–46.0)
Hemoglobin: 11.9 g/dL — ABNORMAL LOW (ref 12.0–15.0)
MCH: 26.9 pg (ref 26.0–34.0)
MCHC: 32.6 g/dL (ref 30.0–36.0)
MCV: 82.6 fL (ref 78.0–100.0)
PLATELETS: 220 10*3/uL (ref 150–400)
RBC: 4.42 MIL/uL (ref 3.87–5.11)
RDW: 14.9 % (ref 11.5–15.5)
WBC: 4.9 10*3/uL (ref 4.0–10.5)

## 2015-07-25 LAB — BRAIN NATRIURETIC PEPTIDE: B Natriuretic Peptide: 37 pg/mL (ref 0.0–100.0)

## 2015-07-25 NOTE — ED Provider Notes (Signed)
CSN: [id number]     Arrival date & time 07/25/15  1133 History   First MD Initiated Contact with Patient 07/25/15 1146     Chief Complaint  Patient presents with  . Chest Pain  . Shortness of Breath     (Consider location/radiation/quality/duration/timing/severity/associated sxs/prior Treatment) Patient is a 74 y.o. female presenting with chest pain.  Chest Pain Pain location:  Substernal area Pain quality: sharp   Pain radiates to:  Does not radiate Pain radiates to the back: no   Pain severity:  Moderate Onset quality:  Gradual Duration:  2 days Timing:  Constant Progression:  Unchanged Chronicity:  New Context: at rest     Past Medical History  Diagnosis Date  . Hypertension     takes meds daily  . Anxiety   . History of seasonal allergies   . Stroke (HCC)     tia  . GERD (gastroesophageal reflux disease)   . Arthritis    Past Surgical History  Procedure Laterality Date  . Joint replacement      left hip, right knee  . Rotator cuff repair      bilateral  . Tonsillectomy    . Abdominal hysterectomy    . Intraocular lens insertion      right eye  . Patellectomy Right 10/26/2012    Procedure: PATELLECTOMY;  Surgeon: Doris LewandowskyFrank J Rowan, MD;  [facility];  Service: Orthopedics;  Laterality: Right;  . I&d knee with poly exchange Right 10/26/2012    Procedure: IRRIGATION AND DEBRIDEMENT KNEE WITH POLY EXCHANGE;  Surgeon: Doris LewandowskyFrank J Rowan, MD;  Location: [facility];  Service: Orthopedics;  Laterality: Right;  poly exchange   Family History  Problem Relation Age of Onset  . Cancer Mother     COLON  . Diabetes Sister   . Hypertension Father    Social History  Substance Use Topics  . Smoking status: Never Smoker   . Smokeless tobacco: None  . Alcohol Use: No   OB History    Gravida Para Term Preterm AB TAB SAB Ectopic Multiple Living   3 3        3      Review of Systems  Cardiovascular: Positive for chest pain.  All other systems reviewed and are  negative.     Allergies  Tape  Home Medications   Prior to Admission medications   Medication Sig Start Date End Date Taking? Authorizing Provider  atenolol (TENORMIN) 25 MG tablet Take 25 mg by mouth daily.   Yes Historical Provider, MD  esomeprazole (NEXIUM) 40 MG capsule Take 40 mg by mouth daily before breakfast.   Yes Historical Provider, MD  HYDROcodone-acetaminophen (NORCO) 5-325 MG per tablet Take 1 tablet by mouth every 4 (four) hours as needed. Patient taking differently: Take 1 tablet by mouth every 4 (four) hours as needed (pain).  07/27/13  Yes Doris BaleElliott Wentz, MD  Meth-Hyo-M Salley HewsBl-Na Phos-Ph Sal (URIBEL) 118 MG CAPS Take 1 capsule by mouth daily.   Yes Historical Provider, MD  montelukast (SINGULAIR) 10 MG tablet Take 10 mg by mouth at bedtime.   Yes Historical Provider, MD  traMADol (ULTRAM) 50 MG tablet Take by mouth every 6 (six) hours as needed.   Yes Historical Provider, MD  triamterene-hydrochlorothiazide (MAXZIDE-25) 37.5-25 MG per tablet Take 1 tablet by mouth daily.   Yes Historical Provider, MD  valsartan (DIOVAN) 320 MG tablet Take 320 mg by mouth daily.   Yes Historical Provider, MD  vitamin C (ASCORBIC ACID) 500 MG  tablet Take 500 mg by mouth daily.   Yes Historical Provider, MD  VITAMIN E PO Take by mouth daily.   Yes Historical Provider, MD  Insulin Pen Needle 31G X 8 MM MISC Use daily as directed with pen. 04/01/15   Ok Edwards, MD   BP 135/60 mmHg  Pulse 76  Temp(Src) 98.3 F (36.8 C) (Oral)  Resp 19  Ht  (1.702 m)  Wt 160 lb (72.576 kg)  BMI 25.05 kg/m2  SpO2 100% Physical Exam  Constitutional: She is oriented to person, place, and time. She appears well-developed and well-nourished.  HENT:  Head: Normocephalic and atraumatic.  Right Ear: External ear normal.  Left Ear: External ear normal.  Eyes: Conjunctivae and EOM are normal. Pupils are equal, round, and reactive to light.  Neck: Normal range of motion. Neck supple.  Cardiovascular:  Normal rate, regular rhythm, normal heart sounds and intact distal pulses.   Pulmonary/Chest: Effort normal and breath sounds normal.  Abdominal: Soft. Bowel sounds are normal. There is no tenderness.  Musculoskeletal: Normal range of motion.  Neurological: She is alert and oriented to person, place, and time.  Skin: Skin is warm and dry.  Vitals reviewed.   ED Course  Procedures (including critical care time) Labs Review Labs Reviewed  BASIC METABOLIC PANEL - Abnormal; Notable for the following:    CO2 20 (*)    BUN 24 (*)    All other components within normal limits  CBC - Abnormal; Notable for the following:    Hemoglobin 11.9 (*)    All other components within normal limits  BRAIN NATRIURETIC PEPTIDE  I-STAT TROPOININ, ED    Imaging Review Dg Chest 2 View  07/25/2015  CLINICAL DATA:  74 year old female with left-sided chest pain for the past 4 days and shortness of breath for 2 months EXAM: CHEST  2 VIEW COMPARISON:  Prior chest x-ray 10/23/2012 FINDINGS: Cardiac and mediastinal contours are within normal limits. Trace atherosclerotic calcification in the transverse aorta. The lungs are clear save for minimal central bronchitic change which is chronic and stable. No acute osseous abnormality. Lower thoracic compression fractures and degenerative disc disease. Soft tissue anchor in the right humeral head consistent with prior rotator cuff repair. No acute osseous abnormality. IMPRESSION: No active cardiopulmonary disease. Electronically Signed   By: Malachy Moan M.D.   On: 07/25/2015 12:47   I have personally reviewed and evaluated these images and lab results as part of my medical decision-making.   EKG Interpretation   Date/Time:  Saturday July 25 2015 11:42:38 EDT Ventricular Rate:  92 PR Interval:  164 QRS Duration: 74 QT Interval:  346 QTC Calculation: 427 R Axis:   77 Text Interpretation:  Sinus rhythm with Premature atrial complexes  Otherwise normal ECG No  significant change since last tracing Confirmed by  [facility] [phone]) on 07/25/2015 12:26:30 PM      MDM   Final diagnoses:  Chest pain, unspecified chest pain type    74 y.o. female with pertinent PMH of HTN, anxiety, prior TIA presents with chest pain and dyspnea as above.  Ho similar symptoms without clear etiology.  No pain on my exam and pt without new symptoms in last 6 hours.  No fevers, productive cough, leg swelling [facility] pain, other concerning historical elements. On arrival vital signs and physical exam as above. Workup unremarkable. Discharged home in stable condition to follow-up with cardiology..    I have reviewed all laboratory and imaging studies if ordered  as above  1. Chest pain, unspecified chest pain type         [facility] Mo, MD 07/26/15 [phone]

## 2015-07-25 NOTE — ED Notes (Signed)
Pt. Stated, I started having some S OB and some chest pain about 2 days ago.. SOB comes and goes even when Im sitting.

## 2015-07-25 NOTE — Discharge Instructions (Signed)
Nonspecific Chest Pain  °Chest pain can be caused by many different conditions. There is always a chance that your pain could be related to something serious, such as a heart attack or a blood clot in your lungs. Chest pain can also be caused by conditions that are not life-threatening. If you have chest pain, it is very important to follow up with your health care provider. °CAUSES  °Chest pain can be caused by: °· Heartburn. °· Pneumonia or bronchitis. °· Anxiety or stress. °· Inflammation around your heart (pericarditis) or lung (pleuritis or pleurisy). °· A blood clot in your lung. °· A collapsed lung (pneumothorax). It can develop suddenly on its own (spontaneous pneumothorax) or from trauma to the chest. °· Shingles infection (varicella-zoster virus). °· Heart attack. °· Damage to the bones, muscles, and cartilage that make up your chest wall. This can include: °¨ Bruised bones due to injury. °¨ Strained muscles or cartilage due to frequent or repeated coughing or overwork. °¨ Fracture to one or more ribs. °¨ Sore cartilage due to inflammation (costochondritis). °RISK FACTORS  °Risk factors for chest pain may include: °· Activities that increase your risk for trauma or injury to your chest. °· Respiratory infections or conditions that cause frequent coughing. °· Medical conditions or overeating that can cause heartburn. °· Heart disease or family history of heart disease. °· Conditions or health behaviors that increase your risk of developing a blood clot. °· Having had chicken pox (varicella zoster). °SIGNS AND SYMPTOMS °Chest pain can feel like: °· Burning or tingling on the surface of your chest or deep in your chest. °· Crushing, pressure, aching, or squeezing pain. °· Dull or sharp pain that is worse when you move, cough, or take a deep breath. °· Pain that is also felt in your back, neck, shoulder, or arm, or pain that spreads to any of these areas. °Your chest pain may come and go, or it may stay  constant. °DIAGNOSIS °Lab tests or other studies may be needed to find the cause of your pain. Your health care provider may have you take a test called an ambulatory ECG (electrocardiogram). An ECG records your heartbeat patterns at the time the test is performed. You may also have other tests, such as: °· Transthoracic echocardiogram (TTE). During echocardiography, sound waves are used to create a picture of all of the heart structures and to look at how blood flows through your heart. °· Transesophageal echocardiogram (TEE). This is a more advanced imaging test that obtains images from inside your body. It allows your health care provider to see your heart in finer detail. °· Cardiac monitoring. This allows your health care provider to monitor your heart rate and rhythm in real time. °· Holter monitor. This is a portable device that records your heartbeat and can help to diagnose abnormal heartbeats. It allows your health care provider to track your heart activity for several days, if needed. °· Stress tests. These can be done through exercise or by taking medicine that makes your heart beat more quickly. °· Blood tests. °· Imaging tests. °TREATMENT  °Your treatment depends on what is causing your chest pain. Treatment may include: °· Medicines. These may include: °¨ Acid blockers for heartburn. °¨ Anti-inflammatory medicine. °¨ Pain medicine for inflammatory conditions. °¨ Antibiotic medicine, if an infection is present. °¨ Medicines to dissolve blood clots. °¨ Medicines to treat coronary artery disease. °· Supportive care for conditions that do not require medicines. This may include: °¨ Resting. °¨ Applying heat   or cold packs to injured areas. °¨ Limiting activities until pain decreases. °HOME CARE INSTRUCTIONS °· If you were prescribed an antibiotic medicine, finish it all even if you start to feel better. °· Avoid any activities that bring on chest pain. °· Do not use any tobacco products, including  cigarettes, chewing tobacco, or electronic cigarettes. If you need help quitting, ask your health care provider. °· Do not drink alcohol. °· Take medicines only as directed by your health care provider. °· Keep all follow-up visits as directed by your health care provider. This is important. This includes any further testing if your chest pain does not go away. °· If heartburn is the cause for your chest pain, you may be told to keep your head raised (elevated) while sleeping. This reduces the chance that acid will go from your stomach into your esophagus. °· Make lifestyle changes as directed by your health care provider. These may include: °¨ Getting regular exercise. Ask your health care provider to suggest some activities that are safe for you. °¨ Eating a heart-healthy diet. A registered dietitian can help you to learn healthy eating options. °¨ Maintaining a healthy weight. °¨ Managing diabetes, if necessary. °¨ Reducing stress. °SEEK MEDICAL CARE IF: °· Your chest pain does not go away after treatment. °· You have a rash with blisters on your chest. °· You have a fever. °SEEK IMMEDIATE MEDICAL CARE IF:  °· Your chest pain is worse. °· You have an increasing cough, or you cough up blood. °· You have severe abdominal pain. °· You have severe weakness. °· You faint. °· You have chills. °· You have sudden, unexplained chest discomfort. °· You have sudden, unexplained discomfort in your arms, back, neck, or jaw. °· You have shortness of breath at any time. °· You suddenly start to sweat, or your skin gets clammy. °· You feel nauseous or you vomit. °· You suddenly feel light-headed or dizzy. °· Your heart begins to beat quickly, or it feels like it is skipping beats. °These symptoms may represent a serious problem that is an emergency. Do not wait to see if the symptoms will go away. Get medical help right away. Call your local emergency services (911 in the [place] Do not drive yourself to the hospital. °  °This  information is not intended to replace advice given to you by your health care provider. Make sure you discuss any questions you have with your health care provider. °  °Document Released: 06/15/2005 Document Revised: 09/26/2014 Document Reviewed: 04/11/2014 °Elsevier Interactive Patient Education ©2016 [employer] ° °

## 2015-07-27 ENCOUNTER — Telehealth: Payer: Self-pay | Admitting: Interventional Cardiology

## 2015-07-27 NOTE — Telephone Encounter (Signed)
Pt c/o Shortness Of Breath: STAT if SOB developed within the last 24 hours or pt is noticeably SOB on the phone  1. Are you currently SOB (can you hear that pt is SOB on the phone)? Little  2. How long have you been experiencing SOB? 2months  3. Are you SOB when sitting or when up moving around? both  4. Are you currently experiencing any other symptoms? Not now but was a little.    Pt went to ER on Saturday.

## 2015-07-27 NOTE — Telephone Encounter (Signed)
Returned pt call. Pt sts that she is currently asymptomatic. Pt sts that she has been having exertional sob for several months. Pt sts that her ED visit in 11/5 work up was negative. The ED physician suggested that her sob my abe pulmonary related, and also she should f/u with cardiology. Pt has not been seen by Dr.Smith since 2014. Pt has an appt scheduled for Dec.2014. Pt sts that she has an appt with her pcp on Wed 11/9. Adv pt to keep that appt. Adv pt to call back if her pcp feels she needs to be seen sooner than Dec by Cardiology. Pt agreeable with plan and verbalized understanding

## 2015-07-27 NOTE — Telephone Encounter (Signed)
Correction pt appt is scheduled for 09/02/15 with Dr.Smith

## 2015-08-20 ENCOUNTER — Other Ambulatory Visit: Payer: Self-pay

## 2015-08-20 NOTE — Telephone Encounter (Signed)
Could you please have patient contact her PCP I typically don't prescribed this

## 2015-09-01 ENCOUNTER — Encounter: Payer: Self-pay | Admitting: Interventional Cardiology

## 2015-09-01 ENCOUNTER — Ambulatory Visit (INDEPENDENT_AMBULATORY_CARE_PROVIDER_SITE_OTHER): Payer: Medicare HMO | Admitting: Interventional Cardiology

## 2015-09-01 VITALS — BP 132/68 | HR 94 | Ht 67.0 in | Wt 167.8 lb

## 2015-09-01 DIAGNOSIS — R079 Chest pain, unspecified: Secondary | ICD-10-CM

## 2015-09-01 DIAGNOSIS — I10 Essential (primary) hypertension: Secondary | ICD-10-CM | POA: Diagnosis not present

## 2015-09-01 DIAGNOSIS — R0600 Dyspnea, unspecified: Secondary | ICD-10-CM | POA: Diagnosis not present

## 2015-09-01 NOTE — Patient Instructions (Signed)
Medication Instructions:  Your physician recommends that you continue on your current medications as directed. Please refer to the Current Medication list given to you today.   Labwork: None ordered  Testing/Procedures: Your physician has requested that you have a lexiscan myoview. For further information please visit [email or web address]. Please follow instruction sheet, as given.   Follow-Up: Your physician recommends that you schedule a follow-up appointment as needed   Any Other Special Instructions Will Be Listed Below (If Applicable).     If you need a refill on your cardiac medications before your next appointment, please call your pharmacy.

## 2015-09-01 NOTE — Progress Notes (Signed)
Cardiology Office Note   Date:  09/01/2015   ID:  Doris Pacheco, DOB November 18, 1940, MRN [id number]  PCP:  Ralene OkMOREIRA,ROY, MD  Cardiologist:  Lesleigh NoeSMITH III,HENRY W, MD   Chief Complaint  Patient presents with  . Chest Pain      History of Present Illness: Doris Pacheco is a 74 y.o. female who presents for exertional dyspnea and atypical chest pain.  Doris Pacheco is followed by Dr. Ludwig ClarksMoreira and has hypertension, prior small vessel stroke, gastroesophageal reflux, and anxiety disorder. She was recently seen in the emergency room with chest pain. She cannot now remember the nature of the chest pain. Her major development has been dyspnea on exertion. This is lifestyle limiting. She is very tearful. She has had difficulty with her neck. She is undergoing steroid epidural injections. She denies orthopnea, edema, and PND. There is no associated palpitations with dyspnea. She denies syncope. An echocardiogram done in September was essentially normal. Mild, grade 1 diastolic dysfunction was noted. EF was 60%. No evidence of pulmonary hypertension was seen.    Past Medical History  Diagnosis Date  . Hypertension     takes meds daily  . Anxiety   . History of seasonal allergies   . Stroke (HCC)     tia  . GERD (gastroesophageal reflux disease)   . Arthritis     Past Surgical History  Procedure Laterality Date  . Joint replacement      left hip, right knee  . Rotator cuff repair      bilateral  . Tonsillectomy    . Abdominal hysterectomy    . Intraocular lens insertion      right eye  . Patellectomy Right 10/26/2012    Procedure: PATELLECTOMY;  Surgeon: Nestor LewandowskyFrank J Rowan, MD;  [facility];  Service: Orthopedics;  Laterality: Right;  . I&d knee with poly exchange Right 10/26/2012    Procedure: IRRIGATION AND DEBRIDEMENT KNEE WITH POLY EXCHANGE;  Surgeon: Nestor LewandowskyFrank J Rowan, MD;  Location: [facility] OR;  Service: Orthopedics;  Laterality: Right;  poly exchange     Current Outpatient Prescriptions    Medication Sig Dispense Refill  . atenolol (TENORMIN) 25 MG tablet Take 25 mg by mouth daily.    Marland Kitchen. CALCIUM-VITAMIN D PO Take 1 tablet by mouth daily.    . diclofenac sodium (VOLTAREN) 1 % GEL Apply 2 g topically 2 (two) times daily as needed (pain).    Marland Kitchen. esomeprazole (NEXIUM) 40 MG capsule Take 40 mg by mouth daily before breakfast.    . estrogens, conjugated, (PREMARIN) 1.25 MG tablet Take 1.25 mg by mouth daily.    Marland Kitchen. HYDROcodone-acetaminophen (NORCO) 10-325 MG tablet Take 1 tablet by mouth every 6 (six) hours as needed for moderate pain.    . meloxicam (MOBIC) 15 MG tablet Take 15 mg by mouth daily as needed for pain.    . Meth-Hyo-M Bl-Na Phos-Ph Sal (URIBEL) 118 MG CAPS Take 1 capsule by mouth daily.    . montelukast (SINGULAIR) 10 MG tablet Take 10 mg by mouth at bedtime.    . Pitavastatin Calcium (LIVALO) 2 MG TABS Take 2 mg by mouth daily.    . Teriparatide, Recombinant, (FORTEO [place]ce) Inject into the skin. Inject subcutaneously into the skin daily    . traMADol (ULTRAM) 50 MG tablet Take 50 mg by mouth every 6 (six) hours as needed for moderate pain.     Marland Kitchen. triamterene-hydrochlorothiazide (MAXZIDE-25) 37.5-25 MG per tablet Take 1 tablet by mouth daily.    .Marland Kitchen  valsartan (DIOVAN) 320 MG tablet Take 320 mg by mouth daily.    . vitamin C (ASCORBIC ACID) 500 MG tablet Take 500 mg by mouth daily.    Marland Kitchen VITAMIN E PO Take 1 capsule by mouth daily.      No current facility-administered medications for this visit.    Allergies:   Tape    Social History:  The patient  reports that she has quit smoking. She has never used smokeless tobacco. She reports that she does not drink alcohol or use illicit drugs.   Family History:  The patient's family history includes Cancer in her mother; Diabetes in her sister; Hypertension in her father; Pneumonia in her father; Stroke in her sister.    ROS:  Please see the history of present illness.   Otherwise, review of systems are positive for neck discomfort,  anxiety episodes, white arm weakness, tearful, and depressed..   All other systems are reviewed and negative.    PHYSICAL EXAM: VS:  BP 132/68 mmHg  Pulse 94  Ht  (1.702 m)  Wt 167 lb 12.8 oz (76.114 kg)  BMI 26.28 kg/m2  SpO2 97% , BMI Body mass index is 26.28 kg/(m^2). GEN: Well nourished, well developed, in no acute distress HEENT: normal Neck: no JVD, carotid bruits, or masses Cardiac: RRR.  There is no murmur, rub, or gallop. There is no edema. Respiratory:  clear to auscultation bilaterally, normal work of breathing. GI: soft, nontender, nondistended, + BS MS: no deformity or atrophy Skin: warm and dry, no rash Neuro:  Strength and sensation are intact Psych: euthymic mood, full affect   EKG:  EKG is not ordered today. The ekg reveals normal sinus with no abnormality.   Recent Labs: 07/25/2015: B Natriuretic Peptide 37.0; BUN 24*; Creatinine, Ser 0.80; Hemoglobin 11.9*; Platelets 220; Potassium 3.6; Sodium 137    Lipid Panel No results found for: CHOL, TRIG, HDL, CHOLHDL, VLDL, LDLCALC, LDLDIRECT    Wt Readings from Last 3 Encounters:  09/01/15 167 lb 12.8 oz (76.114 kg)  07/25/15 160 lb (72.576 kg)  01/08/15 179 lb (81.194 kg)      Other studies Reviewed: Additional studies/ records that were reviewed today include: Electronic medical record. The findings include chest x-ray without evidence of pulmonary congestion or emphysema. BNP less than 100 in November. Hemoglobin of near 12 in November..    ASSESSMENT AND PLAN:  1. Dyspnea on exertion This is worrisome but difficult to attribute to her heart with normal LV function recently and normal BNP. Coronary artery disease could be at play given exertional component.  2.  Essential hypertension Well controlled  3. Chest pain, unspecified chest pain type Atypical and she has a difficult time remembering the quality of the discomfort that led to the emergency room visit and November.    Current  medicines are reviewed at length with the patient today.  The patient has the following concerns regarding medicines: None.  The following changes/actions have been instituted:    Pharmacologic myocardial perfusion study to rule out CAD as the source of the patient's dyspnea.  Labs/ tests ordered today include:  No orders of the defined types were placed in this encounter.     Disposition:   FU with HS in as needed   Signed, Lesleigh Noe, MD  09/01/2015 2:08 PM    [facility] [address] [place], [place], [place]  [address] Phone: [phone]; Fax: [phone]

## 2015-09-07 ENCOUNTER — Other Ambulatory Visit: Payer: Self-pay

## 2015-09-07 ENCOUNTER — Telehealth (HOSPITAL_COMMUNITY): Payer: Self-pay | Admitting: *Deleted

## 2015-09-07 ENCOUNTER — Telehealth: Payer: Self-pay | Admitting: *Deleted

## 2015-09-07 NOTE — Telephone Encounter (Signed)
Pt receives Forteo injection last given on 08/31/15, stopped injection due to blisters, bruising, small rash at the site, along with shortness of breath. Pt would like you recommendations on how to proceed.  Please advise

## 2015-09-07 NOTE — Telephone Encounter (Signed)
If she is is having shortness of breath she needs to go to the emergency room. She will need to stop of Forteo. She can apply calamine or Caladryl lotion and or office appointment to discuss alternative treatment

## 2015-09-07 NOTE — Telephone Encounter (Signed)
Pt aware of the below note. 

## 2015-09-07 NOTE — Telephone Encounter (Signed)
Patient given detailed instructions per Myocardial Perfusion Study Information Sheet for the test on 09/10/15 at 945. Patient notified to arrive 15 minutes early and that it is imperative to arrive on time for appointment to keep from having the test rescheduled.  If you need to cancel or reschedule your appointment, please call the office within 24 hours of your appointment. Failure to do so may result in a cancellation of your appointment, and a $50 no show fee. Patient verbalized understanding.Caden Fukushima J Abir Eroh, RN  

## 2015-09-08 ENCOUNTER — Other Ambulatory Visit: Payer: Self-pay | Admitting: *Deleted

## 2015-09-08 MED ORDER — INSULIN PEN NEEDLE 32G X 4 MM MISC: Status: DC

## 2015-09-10 ENCOUNTER — Ambulatory Visit (HOSPITAL_COMMUNITY): Payer: Medicare HMO | Attending: Interventional Cardiology

## 2015-09-10 DIAGNOSIS — I10 Essential (primary) hypertension: Secondary | ICD-10-CM | POA: Diagnosis not present

## 2015-09-10 DIAGNOSIS — R079 Chest pain, unspecified: Secondary | ICD-10-CM

## 2015-09-10 DIAGNOSIS — R0609 Other forms of dyspnea: Secondary | ICD-10-CM | POA: Insufficient documentation

## 2015-09-10 DIAGNOSIS — R0602 Shortness of breath: Secondary | ICD-10-CM | POA: Diagnosis not present

## 2015-09-10 LAB — MYOCARDIAL PERFUSION IMAGING
CSEPPHR: 78 {beats}/min
LHR: 0.26
LVDIAVOL: 60 mL
LVSYSVOL: 21 mL
NUC STRESS TID: 1.02
Rest HR: 54 {beats}/min
SDS: 4
SRS: 1
SSS: 5

## 2015-09-10 MED ORDER — TECHNETIUM TC 99M SESTAMIBI GENERIC - CARDIOLITE
10.8000 | Freq: Once | INTRAVENOUS | Status: AC | PRN
  Administered 2015-09-10: 11 via INTRAVENOUS

## 2015-09-10 MED ORDER — REGADENOSON 0.4 MG/5ML IV SOLN
0.4000 mg | Freq: Once | INTRAVENOUS | Status: AC
  Administered 2015-09-10: 0.4 mg via INTRAVENOUS

## 2015-09-10 MED ORDER — TECHNETIUM TC 99M SESTAMIBI GENERIC - CARDIOLITE
31.3000 | Freq: Once | INTRAVENOUS | Status: AC | PRN
  Administered 2015-09-10: 31.3 via INTRAVENOUS

## 2015-09-11 ENCOUNTER — Telehealth: Payer: Self-pay | Admitting: Interventional Cardiology

## 2015-09-11 NOTE — Telephone Encounter (Signed)
New message      Calling to see when Dr Katrinka BlazingSmith want to see pt again.  She got her stress test results.  Ok to call next week

## 2015-10-07 ENCOUNTER — Encounter: Payer: Self-pay | Admitting: Gynecology

## 2015-10-07 ENCOUNTER — Ambulatory Visit (INDEPENDENT_AMBULATORY_CARE_PROVIDER_SITE_OTHER): Payer: Medicare HMO | Admitting: Gynecology

## 2015-10-07 VITALS — BP 136/88

## 2015-10-07 DIAGNOSIS — Z780 Asymptomatic menopausal state: Secondary | ICD-10-CM | POA: Diagnosis not present

## 2015-10-07 DIAGNOSIS — M810 Age-related osteoporosis without current pathological fracture: Secondary | ICD-10-CM

## 2015-10-07 DIAGNOSIS — T887XXA Unspecified adverse effect of drug or medicament, initial encounter: Secondary | ICD-10-CM

## 2015-10-07 DIAGNOSIS — T50905A Adverse effect of unspecified drugs, medicaments and biological substances, initial encounter: Secondary | ICD-10-CM

## 2015-10-07 NOTE — Progress Notes (Addendum)
   Patient is a 75 year old who was last seen the office in June 2016. She was strted on Forteo daily injections as a result of her osteoporosis. She had to discontinue the medication after 6 months and she started developing irritation discomfort rash at the injection site. Her history is as follows:  She had a bone density study done in our office in 2016 which demonstrated the following:  Lowest T score right femoral neck -1.3 Frax analysis: 4.6/0.7  Vertebral fracture analysis demonstrated wedge fracture at T11 and T12. As a result of this finding by definition of the [facility] classification patient with vertebral fracture regardless of peripheral T scores is classified as osteoporosis. I had spoken with orthopedic surgeon about starting the patient on Forteo before initiating the treatment. We discussed the side effects from the medication but since she developed a rash she's here to discuss alternative treatment. We are going to change her to Reclast once a year IV infusion. Once again the risk benefits and pros and cons of the medication were discussed to include the following: Osteonecrosis of the jaw as well as spontaneous subtrochanteric fractures of the hip. We are going to check to calcium and vitamin D level today along with PTH level. We'll repeat the bone density study a year after starting the IV infusion of.Reclast to monitor response. Meanwhile patient will continue to take her calcium and vitamin D daily and maintain weightbearing exercises 3 or 4 times a week. Greater than 50% of time was spent in counseling coordinate care for this patient with osteoporosis and side effects from previous medication to treat osteoporosis.

## 2015-10-07 NOTE — Patient Instructions (Signed)

## 2015-10-08 LAB — VITAMIN D 25 HYDROXY (VIT D DEFICIENCY, FRACTURES): Vit D, 25-Hydroxy: 39 ng/mL (ref 30–100)

## 2015-10-08 LAB — PTH, INTACT AND CALCIUM
Calcium: 9.8 mg/dL (ref 8.4–10.5)
PTH: 32 pg/mL (ref 14–64)

## 2015-10-09 ENCOUNTER — Telehealth: Payer: Self-pay | Admitting: Gynecology

## 2015-10-09 DIAGNOSIS — M810 Age-related osteoporosis without current pathological fracture: Secondary | ICD-10-CM

## 2015-10-09 NOTE — Telephone Encounter (Signed)
Pam, please make arrangements for this patient with osteoporosis to receive IV infusion. She had been on Forteo but developed skin rash and tenderness in the site of injection. Please make arrangements for patient to begin receiving once a year Reclast. I ordered a calcium, vitamin D and PTH level today.  Above note from Dr Lily Peer. Orders sent to lab for BUN, CREATININE , calcium level  9.8 on 10/07/15 Called pt and explained Reclast to her. She will come in for labs.

## 2015-10-12 ENCOUNTER — Other Ambulatory Visit: Payer: Medicare HMO

## 2015-10-12 DIAGNOSIS — M810 Age-related osteoporosis without current pathological fracture: Secondary | ICD-10-CM

## 2015-10-12 LAB — BUN: BUN: 22 mg/dL (ref 7–25)

## 2015-10-12 LAB — CREATININE, SERUM: Creat: 0.81 mg/dL (ref 0.60–0.93)

## 2015-10-15 NOTE — Telephone Encounter (Addendum)
Received lab results BUN 22.0   CREATININE 0.81   CALCIUM 9.8  TALKED with Breea she will talk with grandson and call me with infusion date  for the week on 10/19/15  Reclast scheduled on 10/21/2015 at 10 am at [facility] . Informed pt and mailed instructions.

## 2015-10-20 ENCOUNTER — Other Ambulatory Visit (HOSPITAL_COMMUNITY): Payer: Self-pay | Admitting: *Deleted

## 2015-10-21 ENCOUNTER — Ambulatory Visit (HOSPITAL_COMMUNITY)
Admission: RE | Admit: 2015-10-21 | Discharge: 2015-10-21 | Disposition: A | Discharge status: Home / Self Care | Payer: Medicare HMO | Source: Ambulatory Visit | Attending: Gynecology | Admitting: Gynecology

## 2015-10-21 DIAGNOSIS — M810 Age-related osteoporosis without current pathological fracture: Secondary | ICD-10-CM | POA: Diagnosis present

## 2015-10-21 MED ORDER — ZOLEDRONIC ACID 5 MG/100ML IV SOLN: 5.0000 mg | Freq: Once | INTRAVENOUS | Status: DC

## 2015-10-21 MED ORDER — ZOLEDRONIC ACID 5 MG/100ML IV SOLN
INTRAVENOUS | Status: AC
  Administered 2015-10-21: 5 mg
  Filled 2015-10-21: qty 100

## 2015-10-21 NOTE — Discharge Instructions (Signed)

## 2015-10-22 ENCOUNTER — Telehealth: Payer: Self-pay

## 2015-10-22 NOTE — Telephone Encounter (Signed)
Infusion was at [facility] on 10/21/2015

## 2015-10-22 NOTE — Telephone Encounter (Signed)
Patient received Reclast infusion yesterday. Patient said she is having a lot of pain with "bones aching today. She took the Extra Strength Tylenol and as recommended and one of her Gabapentin tabs and it has eased up some.  She asked if anything else she should be doing to relieve it, ie exercising, moving around?  She has just been laying in bed.    She also asked if you will send a letter to Dr. Romero Belling. She is to have 3 injections into spine like she did last year and she asked if you would send him a note telling him it was okay for her to proceed with this after this Reclast Injection.

## 2015-10-22 NOTE — Telephone Encounter (Signed)
Tell the patient that she is doing the right faint but I would recommend that she continue to be more active instead of sitting around. This will pass the next day or so. I will send a note to Doris Pacheco

## 2015-10-22 NOTE — Telephone Encounter (Signed)
Patient informed. 

## 2016-01-11 ENCOUNTER — Ambulatory Visit (INDEPENDENT_AMBULATORY_CARE_PROVIDER_SITE_OTHER): Payer: Medicare HMO | Admitting: Gynecology

## 2016-01-11 ENCOUNTER — Encounter: Payer: Self-pay | Admitting: Gynecology

## 2016-01-11 VITALS — BP 142/88 | Ht 64.0 in | Wt 179.0 lb

## 2016-01-11 DIAGNOSIS — N898 Other specified noninflammatory disorders of vagina: Secondary | ICD-10-CM

## 2016-01-11 DIAGNOSIS — Z01419 Encounter for gynecological examination (general) (routine) without abnormal findings: Secondary | ICD-10-CM | POA: Diagnosis not present

## 2016-01-11 DIAGNOSIS — M8088XS Other osteoporosis with current pathological fracture, vertebra(e), sequela: Secondary | ICD-10-CM

## 2016-01-11 DIAGNOSIS — M8008XS Age-related osteoporosis with current pathological fracture, vertebra(e), sequela: Secondary | ICD-10-CM

## 2016-01-11 LAB — WET PREP FOR TRICH, YEAST, CLUE
Clue Cells Wet Prep HPF POC: NONE SEEN
Trich, Wet Prep: NONE SEEN
YEAST WET PREP: NONE SEEN

## 2016-01-11 MED ORDER — METRONIDAZOLE 500 MG PO TABS
500.0000 mg | ORAL_TABLET | Freq: Two times a day (BID) | ORAL | Status: DC
Start: 2016-01-11 — End: 2016-02-02

## 2016-01-11 MED ORDER — METRONIDAZOLE 500 MG PO TABS: 500.0000 mg | ORAL_TABLET | Freq: Two times a day (BID) | ORAL | Status: DC

## 2016-01-11 NOTE — Progress Notes (Signed)
Doris Pacheco March 24, 1941 [id number]   History:    75 y.o.  for annual gyn exam who in June 2016  was strted on Forteo daily injections as a result of her osteoporosis. She had to discontinue the medication after 6 months and she started developing irritation discomfort rash at the injection site. Her history is as follows:  She had a bone density study done in our office in 2016 which demonstrated the following:  Lowest T score right femoral neck -1.3 Frax analysis: 4.6/0.7  Vertebral fracture analysis demonstrated wedge fracture at T11 and T12. As a result of this finding by definition of the [facility] classification patient with vertebral fracture regardless of peripheral T scores is classified as osteoporosis. I had spoken with orthopedic surgeon about starting the patient on Forteo before initiating the treatment. We discussed the side effects from the medication but since she developed a rash we had discussed alternative treatment. We changed her  to Reclast once a year IV infusion. Once again the risk benefits and pros and cons of the medication were discussed to include the following: Osteonecrosis of the jaw as well as spontaneous subtrochanteric fractures of the hip. She received her first dose in February of this year and has tolerated without no problems. Her calcium vitamin D and PTH level before the infusion were normal.  Her PCP Dr. Harrold Donath has been doing her blood work. Patient states that many years ago she had a total abdominal hysterectomy with ovarian conservation as a result of symptomatic leiomyomatous uteri. She reports colonoscopy in 2013 with removal of benign polyps and she is on a 5 year recall.  Patient denies any vaginal bleeding. Patient denies any past history of any abnormal Pap smears. She is currently being followed by the orthopedic surgeon and receiving injections because of back pains.  Past medical history,surgical history, family history and social history were  all reviewed and documented in the EPIC chart.  Gynecologic History No LMP recorded. Patient is postmenopausal. Contraception: post menopausal status Last Pap: Many years ago. Results were: normal Last mammogram: 2016. Results were: normal  Obstetric History OB History  Gravida Para Term Preterm AB SAB TAB Ectopic Multiple Living  # Outcome Date GA Lbr Len/2nd Weight Sex Delivery Anes PTL Lv  3 Para           2 Para           1 Para                ROS: A ROS was performed and pertinent positives and negatives are included in the history.  GENERAL: No fevers or chills. HEENT: No change in vision, no earache, sore throat or sinus congestion. NECK: No pain or stiffness. CARDIOVASCULAR: No chest pain or pressure. No palpitations. PULMONARY: No shortness of breath, cough or wheeze. GASTROINTESTINAL: No abdominal pain, nausea, vomiting or diarrhea, melena or bright red blood per rectum. GENITOURINARY: No urinary frequency, urgency, hesitancy or dysuria. MUSCULOSKELETAL: No joint or muscle pain, no back pain, no recent trauma. DERMATOLOGIC: No rash, no itching, no lesions. ENDOCRINE: No polyuria, polydipsia, no heat or cold intolerance. No recent change in weight. HEMATOLOGICAL: No anemia or easy bruising or bleeding. NEUROLOGIC: No headache, seizures, numbness, tingling or weakness. PSYCHIATRIC: No depression, no loss of interest in normal activity or change in sleep pattern.     Exam: chaperone present  BP 142/88 mmHg  Ht 5'  4" (1.626 m)  Wt 179 lb (81.194 kg)  BMI 30.71 kg/m2  Body mass index is 30.71 kg/(m^2).  General appearance : Well developed well nourished female. No acute distress HEENT: Eyes: no retinal hemorrhage or exudates,  Neck supple, trachea midline, no carotid bruits, no thyroidmegaly Lungs: Clear to auscultation, no rhonchi or wheezes, or rib retractions  Heart: Regular rate and rhythm, no murmurs or gallops Breast:Examined in sitting and supine  position were symmetrical in appearance, no palpable masses or tenderness,  no skin retraction, no nipple inversion, no nipple discharge, no skin discoloration, no axillary or supraclavicular lymphadenopathy Abdomen: no palpable masses or tenderness, no rebound or guarding Extremities: no edema or skin discoloration or tenderness  Pelvic:  Bartholin, Urethra, Skene Glands: Within normal limits             Vagina: No gross lesions or discharge, atrophic changes  Cervix: Absent  Uterus absent  Adnexa  Without masses or tenderness  Anus and perineum  normal   Rectovaginal  normal sphincter tone without palpated masses or tenderness             Hemoccult PCP provides   Wet prep few white blood cell my her bacteria  Assessment/Plan:  75 y.o. female for annual exam with history of osteoporosis is scheduled for her next Reclast infirm[facility] 2018. She was reminded take her calcium and vitamin D. We discussed importance of weightbearing exercises. For mild bacterial vaginosis she was prescribed Flagyl 500 mg take 1 by mouth twice a day for 7 days. Her PCP is been doing her blood work. Pap smear not indicated according to the new guidelines.   Ok EdwardsFERNANDEZ,JUAN H MD, 5:02 PM 01/11/2016

## 2016-01-11 NOTE — Patient Instructions (Signed)
Metronidazole tablets or capsules  What is this medicine?  METRONIDAZOLE (me troe NI da zole) is an antiinfective. It is used to treat certain kinds of bacterial and protozoal infections. It will not work for colds, flu, or other viral infections.  This medicine may be used for other purposes; ask your health care provider or pharmacist if you have questions.  What should I tell my health care provider before I take this medicine?  They need to know if you have any of these conditions:  -anemia or other blood disorders  -disease of the nervous system  -fungal or yeast infection  -if you drink alcohol containing drinks  -liver disease  -seizures  -an unusual or allergic reaction to metronidazole, or other medicines, foods, dyes, or preservatives  -pregnant or trying to get pregnant  -breast-feeding  How should I use this medicine?  Take this medicine by mouth with a full glass of water. Follow the directions on the prescription label. Take your medicine at regular intervals. Do not take your medicine more often than directed. Take all of your medicine as directed even if you think you are better. Do not skip doses or stop your medicine early.  Talk to your pediatrician regarding the use of this medicine in children. Special care may be needed.  Overdosage: If you think you have taken too much of this medicine contact a poison control center or emergency room at once.  NOTE: This medicine is only for you. Do not share this medicine with others.  What if I miss a dose?  If you miss a dose, take it as soon as you can. If it is almost time for your next dose, take only that dose. Do not take double or extra doses.  What may interact with this medicine?  Do not take this medicine with any of the following medications:  -alcohol or any product that contains alcohol  -amprenavir oral solution  -cisapride  -disulfiram  -dofetilide  -dronedarone  -paclitaxel injection  -pimozide  -ritonavir oral solution  -sertraline oral  solution  -sulfamethoxazole-trimethoprim injection  -thioridazine  -ziprasidone  This medicine may also interact with the following medications:  -birth control pills  -cimetidine  -lithium  -other medicines that prolong the QT interval (cause an abnormal heart rhythm)  -phenobarbital  -phenytoin  -warfarin  This list may not describe all possible interactions. Give your health care provider a list of all the medicines, herbs, non-prescription drugs, or dietary supplements you use. Also tell them if you smoke, drink alcohol, or use illegal drugs. Some items may interact with your medicine.  What should I watch for while using this medicine?  Tell your doctor or health care professional if your symptoms do not improve or if they get worse.  You may get drowsy or dizzy. Do not drive, use machinery, or do anything that needs mental alertness until you know how this medicine affects you. Do not stand or sit up quickly, especially if you are an older patient. This reduces the risk of dizzy or fainting spells.  Avoid alcoholic drinks while you are taking this medicine and for three days afterward. Alcohol may make you feel dizzy, sick, or flushed.  If you are being treated for a sexually transmitted disease, avoid sexual contact until you have finished your treatment. Your sexual partner may also need treatment.  What side effects may I notice from receiving this medicine?  Side effects that you should report to your doctor or health care professional   as soon as possible:  -allergic reactions like skin rash or hives, swelling of the face, lips, or tongue  -confusion, clumsiness  -difficulty speaking  -discolored or sore mouth  -dizziness  -fever, infection  -numbness, tingling, pain or weakness in the hands or feet  -trouble passing urine or change in the amount of urine  -redness, blistering, peeling or loosening of the skin, including inside the mouth  -seizures  -unusually weak or tired  -vaginal irritation, dryness,  or discharge  Side effects that usually do not require medical attention (report to your doctor or health care professional if they continue or are bothersome):  -diarrhea  -headache  -irritability  -metallic taste  -nausea  -stomach pain or cramps  -trouble sleeping  This list may not describe all possible side effects. Call your doctor for medical advice about side effects. You may report side effects to FDA at 1-800-FDA-1088.  Where should I keep my medicine?  Keep out of the reach of children.  Store at room temperature below 25 degrees C (77 degrees F). Protect from light. Keep container tightly closed. Throw away any unused medicine after the expiration date.  NOTE: This sheet is a summary. It may not cover all possible information. If you have questions about this medicine, talk to your doctor, pharmacist, or health care provider.     © 2016, [employer]. (2013-04-12 14:08:39)

## 2016-02-02 ENCOUNTER — Other Ambulatory Visit: Payer: Self-pay | Admitting: Orthopedic Surgery

## 2016-02-02 ENCOUNTER — Inpatient Hospital Stay (HOSPITAL_COMMUNITY)
Admission: EM | Admit: 2016-02-02 | Discharge: 2016-02-08 | DRG: 481 | Disposition: A | Discharge status: Skilled Nursing Facility | Payer: Medicare HMO | Attending: Internal Medicine | Admitting: Internal Medicine

## 2016-02-02 ENCOUNTER — Emergency Department (HOSPITAL_COMMUNITY): Payer: Medicare HMO

## 2016-02-02 ENCOUNTER — Encounter (HOSPITAL_COMMUNITY): Payer: Self-pay | Admitting: Nurse Practitioner

## 2016-02-02 DIAGNOSIS — Z87891 Personal history of nicotine dependence: Secondary | ICD-10-CM | POA: Diagnosis not present

## 2016-02-02 DIAGNOSIS — S80219A Abrasion, unspecified knee, initial encounter: Secondary | ICD-10-CM | POA: Insufficient documentation

## 2016-02-02 DIAGNOSIS — D62 Acute posthemorrhagic anemia: Secondary | ICD-10-CM | POA: Diagnosis not present

## 2016-02-02 DIAGNOSIS — N179 Acute kidney failure, unspecified: Secondary | ICD-10-CM | POA: Diagnosis not present

## 2016-02-02 DIAGNOSIS — S80211A Abrasion, right knee, initial encounter: Secondary | ICD-10-CM

## 2016-02-02 DIAGNOSIS — M9711XA Periprosthetic fracture around internal prosthetic right knee joint, initial encounter: Secondary | ICD-10-CM | POA: Diagnosis not present

## 2016-02-02 DIAGNOSIS — K5900 Constipation, unspecified: Secondary | ICD-10-CM | POA: Diagnosis not present

## 2016-02-02 DIAGNOSIS — F419 Anxiety disorder, unspecified: Secondary | ICD-10-CM | POA: Diagnosis present

## 2016-02-02 DIAGNOSIS — W19XXXA Unspecified fall, initial encounter: Secondary | ICD-10-CM | POA: Diagnosis not present

## 2016-02-02 DIAGNOSIS — Z888 Allergy status to other drugs, medicaments and biological substances status: Secondary | ICD-10-CM | POA: Diagnosis not present

## 2016-02-02 DIAGNOSIS — D509 Iron deficiency anemia, unspecified: Secondary | ICD-10-CM | POA: Diagnosis not present

## 2016-02-02 DIAGNOSIS — Z8249 Family history of ischemic heart disease and other diseases of the circulatory system: Secondary | ICD-10-CM

## 2016-02-02 DIAGNOSIS — Z66 Do not resuscitate: Secondary | ICD-10-CM | POA: Diagnosis present

## 2016-02-02 DIAGNOSIS — M810 Age-related osteoporosis without current pathological fracture: Secondary | ICD-10-CM | POA: Diagnosis present

## 2016-02-02 DIAGNOSIS — Z800 Family history of malignant neoplasm of digestive organs: Secondary | ICD-10-CM

## 2016-02-02 DIAGNOSIS — Z23 Encounter for immunization: Secondary | ICD-10-CM | POA: Diagnosis not present

## 2016-02-02 DIAGNOSIS — Z8673 Personal history of transient ischemic attack (TIA), and cerebral infarction without residual deficits: Secondary | ICD-10-CM

## 2016-02-02 DIAGNOSIS — S72401A Unspecified fracture of lower end of right femur, initial encounter for closed fracture: Secondary | ICD-10-CM

## 2016-02-02 DIAGNOSIS — W010XXA Fall on same level from slipping, tripping and stumbling without subsequent striking against object, initial encounter: Secondary | ICD-10-CM | POA: Diagnosis present

## 2016-02-02 DIAGNOSIS — S72471S Torus fracture of lower end of right femur, sequela: Secondary | ICD-10-CM | POA: Diagnosis not present

## 2016-02-02 DIAGNOSIS — T465X5A Adverse effect of other antihypertensive drugs, initial encounter: Secondary | ICD-10-CM | POA: Diagnosis not present

## 2016-02-02 DIAGNOSIS — I10 Essential (primary) hypertension: Secondary | ICD-10-CM | POA: Diagnosis not present

## 2016-02-02 DIAGNOSIS — Z823 Family history of stroke: Secondary | ICD-10-CM | POA: Diagnosis not present

## 2016-02-02 DIAGNOSIS — T148XXA Other injury of unspecified body region, initial encounter: Secondary | ICD-10-CM

## 2016-02-02 DIAGNOSIS — Y92239 Unspecified place in hospital as the place of occurrence of the external cause: Secondary | ICD-10-CM | POA: Diagnosis not present

## 2016-02-02 DIAGNOSIS — D508 Other iron deficiency anemias: Secondary | ICD-10-CM | POA: Diagnosis not present

## 2016-02-02 DIAGNOSIS — K219 Gastro-esophageal reflux disease without esophagitis: Secondary | ICD-10-CM | POA: Diagnosis present

## 2016-02-02 DIAGNOSIS — D649 Anemia, unspecified: Secondary | ICD-10-CM | POA: Diagnosis present

## 2016-02-02 DIAGNOSIS — Z91048 Other nonmedicinal substance allergy status: Secondary | ICD-10-CM | POA: Diagnosis not present

## 2016-02-02 DIAGNOSIS — M1990 Unspecified osteoarthritis, unspecified site: Secondary | ICD-10-CM | POA: Diagnosis present

## 2016-02-02 DIAGNOSIS — Z96659 Presence of unspecified artificial knee joint: Secondary | ICD-10-CM

## 2016-02-02 DIAGNOSIS — I519 Heart disease, unspecified: Secondary | ICD-10-CM | POA: Diagnosis present

## 2016-02-02 DIAGNOSIS — T84038A Mechanical loosening of other internal prosthetic joint, initial encounter: Secondary | ICD-10-CM | POA: Diagnosis present

## 2016-02-02 LAB — BASIC METABOLIC PANEL
Anion gap: 11 (ref 5–15)
BUN: 18 mg/dL (ref 6–20)
CHLORIDE: 102 mmol/L (ref 101–111)
CO2: 23 mmol/L (ref 22–32)
CREATININE: 0.79 mg/dL (ref 0.44–1.00)
Calcium: 9 mg/dL (ref 8.9–10.3)
GFR calc Af Amer: >60 mL/min (ref >60)
GFR calc non Af Amer: >60 mL/min (ref >60)
GLUCOSE: 103 mg/dL — AB (ref 65–99)
POTASSIUM: 3.4 mmol/L — AB (ref 3.5–5.1)
SODIUM: 136 mmol/L (ref 135–145)

## 2016-02-02 LAB — CBC WITH DIFFERENTIAL/PLATELET
Basophils Absolute: 0 10*3/uL (ref 0.0–0.1)
Basophils Relative: 0 %
EOS ABS: 0 10*3/uL (ref 0.0–0.7)
Eosinophils Relative: 0 %
HCT: 36.3 % (ref 36.0–46.0)
HEMOGLOBIN: 11.8 g/dL — AB (ref 12.0–15.0)
LYMPHS ABS: 0.9 10*3/uL (ref 0.7–4.0)
LYMPHS PCT: 13 %
MCH: 26.9 pg (ref 26.0–34.0)
MCHC: 32.5 g/dL (ref 30.0–36.0)
MCV: 82.9 fL (ref 78.0–100.0)
MONOS PCT: 6 %
Monocytes Absolute: 0.4 10*3/uL (ref 0.1–1.0)
NEUTROS PCT: 81 %
Neutro Abs: 5.3 10*3/uL (ref 1.7–7.7)
Platelets: 231 10*3/uL (ref 150–400)
RBC: 4.38 MIL/uL (ref 3.87–5.11)
RDW: 15 % (ref 11.5–15.5)
WBC: 6.5 10*3/uL (ref 4.0–10.5)

## 2016-02-02 LAB — TYPE AND SCREEN
ABO/RH(D): O POS
Antibody Screen: NEGATIVE

## 2016-02-02 LAB — PROTIME-INR
INR: 1.04 (ref 0.00–1.49)
Prothrombin Time: 13.8 seconds (ref 11.6–15.2)

## 2016-02-02 LAB — CALCIUM: CALCIUM: 8.9 mg/dL (ref 8.9–10.3)

## 2016-02-02 LAB — APTT: APTT: 29 s (ref 24–37)

## 2016-02-02 LAB — ALBUMIN: ALBUMIN: 3.6 g/dL (ref 3.5–5.0)

## 2016-02-02 MED ORDER — DOCUSATE SODIUM 100 MG PO CAPS
100.0000 mg | ORAL_CAPSULE | Freq: Two times a day (BID) | ORAL | Status: DC
  Administered 2016-02-03 – 2016-02-06 (×7): 100 mg via ORAL
  Filled 2016-02-02 (×7): qty 1

## 2016-02-02 MED ORDER — HYDROMORPHONE HCL 1 MG/ML IJ SOLN
0.5000 mg | Freq: Once | INTRAMUSCULAR | Status: AC
  Administered 2016-02-02: 0.5 mg via INTRAVENOUS
  Filled 2016-02-02: qty 1

## 2016-02-02 MED ORDER — MORPHINE SULFATE (PF) 4 MG/ML IV SOLN
4.0000 mg | Freq: Once | INTRAVENOUS | Status: DC
Start: 2016-02-02 — End: 2016-02-02
  Filled 2016-02-02: qty 1

## 2016-02-02 MED ORDER — LORAZEPAM 0.5 MG PO TABS
0.5000 mg | ORAL_TABLET | Freq: Three times a day (TID) | ORAL | Status: DC
  Administered 2016-02-02 – 2016-02-08 (×17): 0.5 mg via ORAL
  Filled 2016-02-02 (×17): qty 1

## 2016-02-02 MED ORDER — IRBESARTAN 300 MG PO TABS
300.0000 mg | ORAL_TABLET | Freq: Every day | ORAL | Status: DC
  Administered 2016-02-02 – 2016-02-04 (×3): 300 mg via ORAL
  Filled 2016-02-02 (×3): qty 1

## 2016-02-02 MED ORDER — POVIDONE-IODINE 10 % EX SWAB: 2.0000 "application " | Freq: Once | CUTANEOUS | Status: DC

## 2016-02-02 MED ORDER — ATENOLOL 50 MG PO TABS
25.0000 mg | ORAL_TABLET | Freq: Every day | ORAL | Status: DC
  Administered 2016-02-02 – 2016-02-08 (×5): 25 mg via ORAL
  Filled 2016-02-02 (×7): qty 1

## 2016-02-02 MED ORDER — DEXTROSE-NACL 5-0.45 % IV SOLN: INTRAVENOUS | Status: DC

## 2016-02-02 MED ORDER — HYDRALAZINE HCL 10 MG PO TABS
10.0000 mg | ORAL_TABLET | Freq: Three times a day (TID) | ORAL | Status: DC
  Administered 2016-02-03: 10 mg via ORAL
  Filled 2016-02-02: qty 1

## 2016-02-02 MED ORDER — CHLORHEXIDINE GLUCONATE 4 % EX LIQD
60.0000 mL | Freq: Once | CUTANEOUS | Status: DC
  Filled 2016-02-02: qty 60

## 2016-02-02 MED ORDER — MORPHINE SULFATE (PF) 4 MG/ML IV SOLN
4.0000 mg | Freq: Once | INTRAVENOUS | Status: AC
  Administered 2016-02-02: 4 mg via INTRAVENOUS

## 2016-02-02 MED ORDER — VITAMIN C 500 MG PO TABS
500.0000 mg | ORAL_TABLET | Freq: Every day | ORAL | Status: DC
  Administered 2016-02-02 – 2016-02-08 (×7): 500 mg via ORAL
  Filled 2016-02-02 (×7): qty 1

## 2016-02-02 MED ORDER — POLYETHYLENE GLYCOL 3350 17 G PO PACK: 17.0000 g | PACK | Freq: Every day | ORAL | Status: DC | PRN

## 2016-02-02 MED ORDER — MONTELUKAST SODIUM 10 MG PO TABS
10.0000 mg | ORAL_TABLET | Freq: Every day | ORAL | Status: DC
  Administered 2016-02-02 – 2016-02-07 (×6): 10 mg via ORAL
  Filled 2016-02-02 (×6): qty 1

## 2016-02-02 MED ORDER — CEFAZOLIN SODIUM-DEXTROSE 2-4 GM/100ML-% IV SOLN
2.0000 g | INTRAVENOUS | Status: AC
  Administered 2016-02-03: 2 g via INTRAVENOUS
  Filled 2016-02-02: qty 100

## 2016-02-02 MED ORDER — PRAVASTATIN SODIUM 40 MG PO TABS
40.0000 mg | ORAL_TABLET | Freq: Every day | ORAL | Status: DC
  Administered 2016-02-02 – 2016-02-07 (×5): 40 mg via ORAL
  Filled 2016-02-02 (×5): qty 1

## 2016-02-02 MED ORDER — MORPHINE SULFATE (PF) 2 MG/ML IV SOLN
1.0000 mg | INTRAVENOUS | Status: DC | PRN
  Administered 2016-02-02 – 2016-02-03 (×3): 2 mg via INTRAVENOUS
  Administered 2016-02-03: 4 mg via INTRAVENOUS
  Administered 2016-02-03: 2 mg via INTRAVENOUS
  Administered 2016-02-03 – 2016-02-07 (×2): 4 mg via INTRAVENOUS
  Filled 2016-02-02: qty 2
  Filled 2016-02-02: qty 1
  Filled 2016-02-02: qty 2
  Filled 2016-02-02 (×3): qty 1
  Filled 2016-02-02: qty 2

## 2016-02-02 MED ORDER — PANTOPRAZOLE SODIUM 40 MG PO TBEC
40.0000 mg | DELAYED_RELEASE_TABLET | Freq: Every day | ORAL | Status: DC
  Administered 2016-02-02 – 2016-02-08 (×7): 40 mg via ORAL
  Filled 2016-02-02 (×7): qty 1

## 2016-02-02 MED ORDER — MORPHINE SULFATE (PF) 4 MG/ML IV SOLN
4.0000 mg | Freq: Once | INTRAVENOUS | Status: AC
  Administered 2016-02-02: 4 mg via INTRAVENOUS
  Filled 2016-02-02: qty 1

## 2016-02-02 MED ORDER — HYDROCODONE-ACETAMINOPHEN 5-325 MG PO TABS
1.0000 | ORAL_TABLET | Freq: Four times a day (QID) | ORAL | Status: DC | PRN
  Administered 2016-02-02 – 2016-02-06 (×11): 2 via ORAL
  Filled 2016-02-02 (×11): qty 2

## 2016-02-02 MED ORDER — TETANUS-DIPHTH-ACELL PERTUSSIS 5-2.5-18.5 LF-MCG/0.5 IM SUSP
0.5000 mL | Freq: Once | INTRAMUSCULAR | Status: AC
  Administered 2016-02-02: 0.5 mL via INTRAMUSCULAR
  Filled 2016-02-02: qty 0.5

## 2016-02-02 MED ORDER — METHOCARBAMOL 500 MG PO TABS
500.0000 mg | ORAL_TABLET | Freq: Four times a day (QID) | ORAL | Status: DC | PRN
  Administered 2016-02-02 – 2016-02-08 (×12): 500 mg via ORAL
  Filled 2016-02-02 (×12): qty 1

## 2016-02-02 MED ORDER — URIBEL 118 MG PO CAPS: 1.0000 | ORAL_CAPSULE | Freq: Every day | ORAL | Status: DC

## 2016-02-02 MED ORDER — SODIUM CHLORIDE 0.9 % IV SOLN
INTRAVENOUS | Status: DC
  Administered 2016-02-02 – 2016-02-03 (×2): via INTRAVENOUS

## 2016-02-02 MED ORDER — GABAPENTIN 300 MG PO CAPS
300.0000 mg | ORAL_CAPSULE | Freq: Three times a day (TID) | ORAL | Status: DC
  Administered 2016-02-02 – 2016-02-08 (×16): 300 mg via ORAL
  Filled 2016-02-02 (×16): qty 1

## 2016-02-02 MED ORDER — MORPHINE SULFATE (PF) 2 MG/ML IV SOLN: 0.5000 mg | INTRAVENOUS | Status: DC | PRN

## 2016-02-02 MED ORDER — FERROUS SULFATE 325 (65 FE) MG PO TABS
325.0000 mg | ORAL_TABLET | Freq: Three times a day (TID) | ORAL | Status: DC
  Administered 2016-02-02 – 2016-02-08 (×16): 325 mg via ORAL
  Filled 2016-02-02 (×16): qty 1

## 2016-02-02 MED ORDER — BISACODYL 10 MG RE SUPP
10.0000 mg | Freq: Every day | RECTAL | Status: DC | PRN
  Administered 2016-02-07: 10 mg via RECTAL
  Filled 2016-02-02: qty 1

## 2016-02-02 MED ORDER — ONDANSETRON HCL 4 MG/2ML IJ SOLN
4.0000 mg | Freq: Four times a day (QID) | INTRAMUSCULAR | Status: DC | PRN
  Administered 2016-02-07: 4 mg via INTRAVENOUS
  Filled 2016-02-02: qty 2

## 2016-02-02 MED ORDER — METHOCARBAMOL 1000 MG/10ML IJ SOLN: 500.0000 mg | Freq: Four times a day (QID) | INTRAMUSCULAR | Status: DC | PRN

## 2016-02-02 MED ORDER — ESTROGENS CONJUGATED 1.25 MG PO TABS
1.2500 mg | ORAL_TABLET | Freq: Every day | ORAL | Status: DC
  Administered 2016-02-02 – 2016-02-04 (×3): 1.25 mg via ORAL
  Filled 2016-02-02 (×3): qty 1

## 2016-02-02 NOTE — ED Notes (Signed)
Ortho at bedside to place splint.

## 2016-02-02 NOTE — ED Notes (Signed)
Pt was emotional and stated that she "felt better sitting in the wheelchair". Pt stated that she "did not want to straighten her knee". Family is in the room with pt.

## 2016-02-02 NOTE — Progress Notes (Signed)
Orthopedic Tech Progress Note Patient Details:  Doris Pacheco [birth year] [id number]  Ortho Devices Type of Ortho Device: Ace wrap, Long leg splint Ortho Device/Splint Interventions: Application   Doris FordyceJennifer C Syon Pacheco 02/02/2016, 1:17 PM

## 2016-02-02 NOTE — Consult Note (Signed)
Reason for Consult: Right periprosthetic distal femur fracture Referring Physician: Emergency room physician  Doris Pacheco is an 75 y.o. female.  HPI: Patient was outside today slipped and fell landing on her right knee on concrete and sustained a right distal femur periprosthetic fracture just above a DePuy Sigma RP total knee arthroplasty. She has a superficial abrasion in the skin. Her index total knee was done 2008 by Dr. Para March, there was loosening of the patellar component in 2014 with fragmentation of the patella. I was consult it and performed a patellectomy me irrigation and debridement with polyethylene exchange that did well until the mishap today. Patient denies any loss of consciousness or any other injuries. Her x-rays show an angulated near 100% apposed distal femur fracture just above the superior flange of the femoral component. There is no evidence of loosening of the components. The superficial abrasion is about 1 cm in size and there is no active bleeding. Comorbidities include hypertension history of stroke with good recovery.  Past Medical History  Diagnosis Date  . Hypertension     takes meds daily  . Anxiety   . History of seasonal allergies   . Stroke ([place])     tia  . GERD (gastroesophageal reflux disease)   . Arthritis     Past Surgical History  Procedure Laterality Date  . Joint replacement      left hip, right knee  . Rotator cuff repair      bilateral  . Tonsillectomy    . Abdominal hysterectomy    . Intraocular lens insertion      right eye  . Patellectomy Right 10/26/2012    Procedure: PATELLECTOMY;  Surgeon: Kerin Salen, MD;  Location: [facility];  Service: Orthopedics;  Laterality: Right;  . I&d knee with poly exchange Right 10/26/2012    Procedure: IRRIGATION AND DEBRIDEMENT KNEE WITH POLY EXCHANGE;  Surgeon: Kerin Salen, MD;  Location: [facility];  Service: Orthopedics;  Laterality: Right;  poly exchange    Family History  Problem Relation Age of Onset  .  Cancer Mother     COLON  . Diabetes Sister   . Stroke Sister   . Hypertension Father   . Pneumonia Father     Social History:  reports that she has quit smoking. She has never used smokeless tobacco. She reports that she does not drink alcohol or use illicit drugs.  Allergies:  Allergies  Allergen Reactions  . Forteo [Teriparatide (Recombinant)] Shortness Of Breath  . Tape Rash    Paper tape OK    Medications: I have reviewed the patient's current medications.  Results for orders placed or performed during the hospital encounter of 02/02/16 (from the past 48 hour(s))  CBC with Differential     Status: Abnormal   Collection Time: 02/02/16  1:00 PM  Result Value Ref Range   WBC 6.5 4.0 - 10.5 K/uL   RBC 4.38 3.87 - 5.11 MIL/uL   Hemoglobin 11.8 (L) 12.0 - 15.0 g/dL   HCT 36.3 36.0 - 46.0 %   MCV 82.9 78.0 - 100.0 fL   MCH 26.9 26.0 - 34.0 pg   MCHC 32.5 30.0 - 36.0 g/dL   RDW 15.0 11.5 - 15.5 %   Platelets 231 150 - 400 K/uL   Neutrophils Relative % 81 %   Neutro Abs 5.3 1.7 - 7.7 K/uL   Lymphocytes Relative 13 %   Lymphs Abs 0.9 0.7 - 4.0 K/uL   Monocytes Relative 6 %  Monocytes Absolute 0.4 0.1 - 1.0 K/uL   Eosinophils Relative 0 %   Eosinophils Absolute 0.0 0.0 - 0.7 K/uL   Basophils Relative 0 %   Basophils Absolute 0.0 0.0 - 0.1 K/uL  Basic metabolic panel     Status: Abnormal   Collection Time: 02/02/16  1:00 PM  Result Value Ref Range   Sodium 136 135 - 145 mmol/L   Potassium 3.4 (L) 3.5 - 5.1 mmol/L   Chloride 102 101 - 111 mmol/L   CO2 23 22 - 32 mmol/L   Glucose, Bld 103 (H) 65 - 99 mg/dL   BUN 18 6 - 20 mg/dL   Creatinine, Ser 0.79 0.44 - 1.00 mg/dL   Calcium 9.0 8.9 - 10.3 mg/dL   GFR calc non Af Amer >60 >60 mL/min   GFR calc Af Amer >60 >60 mL/min    Comment: (NOTE) The eGFR has been calculated using the CKD EPI equation. This calculation has not been validated in all clinical situations. eGFR's persistently <60 mL/min signify possible Chronic  Kidney Disease.    Anion gap 11 5 - 15  Calcium     Status: None   Collection Time: 02/02/16  2:48 PM  Result Value Ref Range   Calcium 8.9 8.9 - 10.3 mg/dL  Albumin     Status: None   Collection Time: 02/02/16  2:48 PM  Result Value Ref Range   Albumin 3.6 3.5 - 5.0 g/dL  APTT     Status: None   Collection Time: 02/02/16  2:48 PM  Result Value Ref Range   aPTT 29 24 - 37 seconds  Protime-INR     Status: None   Collection Time: 02/02/16  2:48 PM  Result Value Ref Range   Prothrombin Time 13.8 11.6 - 15.2 seconds   INR 1.04 0.00 - 1.49  Type and screen [place]     Status: None   Collection Time: 02/02/16  2:50 PM  Result Value Ref Range   ABO/RH(D) O POS    Antibody Screen NEG    Sample Expiration 02/05/2016     Dg Chest 2 View  02/02/2016  CLINICAL DATA:  Preop evaluation for broken right femur EXAM: CHEST  2 VIEW COMPARISON:  07/25/2015 FINDINGS: The heart size and mediastinal contours are within normal limits. Both lungs are clear. The visualized skeletal structures shows stable degenerative changes of the thoracic spine with increase kyphosis. IMPRESSION: No active cardiopulmonary disease. Electronically Signed   By: Inez Catalina M.D.   On: 02/02/2016 13:25   Dg Knee Complete 4 Views Right  02/02/2016  CLINICAL DATA:  Tripped and fell on concrete this morning, EXAM: RIGHT KNEE - COMPLETE 4+ VIEW COMPARISON:  None. FINDINGS: Four views of the right knee submitted. There is right knee prosthesis with anatomic alignment. Mild displaced minimal angulated comminuted fracture in distal right femoral metaphysis above the level of prosthesis. IMPRESSION: There is right knee prosthesis with anatomic alignment. Mild displaced minimal angulated comminuted fracture in distal right femoral metaphysis above the level of prosthesis. Electronically Signed   By: Lahoma Crocker M.D.   On: 02/02/2016 12:22    ROS: patient denies any chest pain shortness of breath loss of  consciousness nausea or vomiting. Blood pressure 169/77, pulse 79, temperature 98.1 F (36.7 C), temperature source Oral, resp. rate 20, SpO2 100 %. Physical Exam: Patient is in a posterior splint which is taken down there is a 1 cm superficial abrasion of the anterior aspect of  the knee there is no active bleeding. He is neurovascularly intact distally. Wiggles her toes without difficulty normal sensation to her feet.  Assessment/Plan: Assessment: Right distal femur periprosthetic fracture just above a DePuy Sigma RP total knee with a standard box.  Plan: We will get her set up for open reduction internal fixation using a Phoenix retrograde nail to control motion, and allow compression through the fracture site. Risks and benefits of surgery were discussed with the patient and family in the room.  Kerin Salen 02/02/2016, 5:51 PM

## 2016-02-02 NOTE — ED Notes (Signed)
Patient in xray.  Called to have them bring her to T7 after xray.

## 2016-02-02 NOTE — ED Provider Notes (Signed)
History  By signing my name below, I, Doris Pacheco, attest that this documentation has been prepared under the direction and in the presence of Doris Alsop Camprubi-Soms, PA-C. Electronically Signed: Earmon PhoenixJennifer Pacheco, ED Scribe. 02/02/2016. 1:12 PM.  Chief Complaint  Patient presents with  . Fall   Patient is a 75 y.o. female presenting with fall. The history is provided by the patient and medical records. No language interpreter was used.  Fall This is a new problem. The current episode started 1 to 2 hours ago. The problem occurs rarely. The problem has not changed since onset.Pertinent negatives include no chest pain, no abdominal pain and no shortness of breath. The symptoms are aggravated by bending, twisting, standing and walking. Nothing relieves the symptoms. She has tried nothing for the symptoms. The treatment provided no relief.    HPI Comments:  Doris Pacheco is a 75 y.o. female with a PMHx of DM2, HTN, and arthritis s/p right knee replacement (by Dr. Turner Danielsowan @[facility] 7 years ago), who presents to the Emergency Department complaining of a mechanical fall that occurred about 2.5 hours ago. She reports falling on the right knee. She reports intermittent, sharp pain she rates at "20"/10. She reports associated swelling of the right knee and an abrasion to the anterior right knee. Moving the right knee increases the pain. She denies alleviating factors. She has not taken anything for pain. She denies head trauma, LOC, numbness, tingling, weakness, CP, SOB, light-headedness or dizziness, back pain, neck pain, abdominal pain, nausea, vomiting, saddle anesthesia or cauda equina symptoms, bowel or bladder incontinence, or any other injuries. She states her last tetanus vaccination was more than 5 years ago. Pt states she has not eaten today.   Past Medical History  Diagnosis Date  . Hypertension     takes meds daily  . Anxiety   . History of seasonal allergies   . Stroke (HCC)      tia  . GERD (gastroesophageal reflux disease)   . Arthritis    Past Surgical History  Procedure Laterality Date  . Joint replacement      left hip, right knee  . Rotator cuff repair      bilateral  . Tonsillectomy    . Abdominal hysterectomy    . Intraocular lens insertion      right eye  . Patellectomy Right 10/26/2012    Procedure: PATELLECTOMY;  Surgeon: Nestor LewandowskyFrank J Rowan, MD;  [facility];  Service: Orthopedics;  Laterality: Right;  . I&d knee with poly exchange Right 10/26/2012    Procedure: IRRIGATION AND DEBRIDEMENT KNEE WITH POLY EXCHANGE;  Surgeon: Nestor LewandowskyFrank J Rowan, MD;  Location: [facility] OR;  Service: Orthopedics;  Laterality: Right;  poly exchange   Family History  Problem Relation Age of Onset  . Cancer Mother     COLON  . Diabetes Sister   . Stroke Sister   . Hypertension Father   . Pneumonia Father    Social History  Substance Use Topics  . Smoking status: Former Games developermoker  . Smokeless tobacco: Never Used  . Alcohol Use: No   OB History    Gravida Para Term Preterm AB TAB SAB Ectopic Multiple Living   3 3        3      Review of Systems  HENT: Negative for facial swelling (no head inj).   Respiratory: Negative for shortness of breath.   Cardiovascular: Negative for chest pain.  Gastrointestinal: Negative for nausea, vomiting and abdominal pain.  Genitourinary:  Negative for difficulty urinating (no incontinence).       No bowel or bladder incontinence  Musculoskeletal: Positive for joint swelling and arthralgias. Negative for back pain and neck pain.  Skin: Positive for wound. Negative for color change.  Allergic/Immunologic: Negative for immunocompromised state.  Neurological: Negative for dizziness, syncope, weakness, light-headedness and numbness.  Psychiatric/Behavioral: Negative for confusion.   A complete 10 system review of systems was obtained and all systems are negative except as noted in the HPI and PMH.   Allergies  Forteo and Tape  Home Medications    Prior to Admission medications   Medication Sig Start Date End Date Taking? Authorizing Provider  atenolol (TENORMIN) 25 MG tablet Take 25 mg by mouth daily.    Historical Provider, MD  CALCIUM-VITAMIN D PO Take 1 tablet by mouth daily.    Historical Provider, MD  diclofenac sodium (VOLTAREN) 1 % GEL Apply 2 g topically 2 (two) times daily as needed (pain).    Historical Provider, MD  esomeprazole (NEXIUM) 40 MG capsule Take 40 mg by mouth daily before breakfast.    Historical Provider, MD  estrogens, conjugated, (PREMARIN) 1.25 MG tablet Take 1.25 mg by mouth daily.    Historical Provider, MD  gabapentin (NEURONTIN) 300 MG capsule Take 300 mg by mouth 3 (three) times daily.    Historical Provider, MD  HYDROcodone-acetaminophen (NORCO) 10-325 MG tablet Take 1 tablet by mouth every 6 (six) hours as needed for moderate pain.    Historical Provider, MD  Insulin Pen Needle 32G X 4 MM MISC Pt to use once a day for with her Forteo pen 09/08/15   Ok Edwards, MD  LORazepam (ATIVAN) 0.5 MG tablet Take 0.5 mg by mouth every 8 (eight) hours.    Historical Provider, MD  meloxicam (MOBIC) 15 MG tablet Take 15 mg by mouth daily as needed for pain.    Historical Provider, MD  Meth-Hyo-M Bl-Na Phos-Ph Sal (URIBEL) 118 MG CAPS Take 1 capsule by mouth daily.    Historical Provider, MD  metroNIDAZOLE (FLAGYL) 500 MG tablet Take 1 tablet (500 mg total) by mouth 2 (two) times daily. 01/11/16   Ok Edwards, MD  montelukast (SINGULAIR) 10 MG tablet Take 10 mg by mouth at bedtime.    Historical Provider, MD  Pitavastatin Calcium (LIVALO) 2 MG TABS Take 2 mg by mouth daily.    Historical Provider, MD  Teriparatide, Recombinant, (FORTEO [place]) Inject into the skin. Reported on 10/07/2015    Historical Provider, MD  traMADol (ULTRAM) 50 MG tablet Take 50 mg by mouth every 6 (six) hours as needed for moderate pain.     Historical Provider, MD  triamterene-hydrochlorothiazide (MAXZIDE-25) 37.5-25 MG per tablet Take 1  tablet by mouth daily.    Historical Provider, MD  valsartan (DIOVAN) 320 MG tablet Take 320 mg by mouth daily.    Historical Provider, MD  vitamin C (ASCORBIC ACID) 500 MG tablet Take 500 mg by mouth daily.    Historical Provider, MD  VITAMIN E PO Take 1 capsule by mouth daily.     Historical Provider, MD   Triage Vitals: BP 163/93 mmHg  Pulse 71  Temp(Src) 98 F (36.7 C)  Resp 17  SpO2 99% Physical Exam  Constitutional: She is oriented to person, place, and time. Vital signs are normal. She appears well-developed and well-nourished.  Non-toxic appearance. No distress.  Afebrile, nontoxic, NAD  HENT:  Head: Normocephalic and atraumatic.  Mouth/Throat: Oropharynx is clear and moist and mucous  membranes are normal.  Eyes: Conjunctivae and EOM are normal. Right eye exhibits no discharge. Left eye exhibits no discharge.  Neck: Normal range of motion. Neck supple.  Cardiovascular: Normal rate, regular rhythm, normal heart sounds and intact distal pulses.  Exam reveals no gallop and no friction rub.   No murmur heard. Pulmonary/Chest: Effort normal and breath sounds normal. No respiratory distress. She has no decreased breath sounds. She has no wheezes. She has no rhonchi. She has no rales.  Abdominal: Soft. Normal appearance and bowel sounds are normal. She exhibits no distension. There is no tenderness. There is no rigidity, no rebound, no guarding, no CVA tenderness, no tenderness at McBurney's point and negative Murphy's sign.  Musculoskeletal:       Right knee: She exhibits decreased range of motion (due to pain), swelling, laceration (abrasion) and bony tenderness. She exhibits no deformity and normal alignment. Tenderness found. Medial joint line and lateral joint line tenderness noted.  Right knee with midline scar from prior surgery. Well-healed with abrasion over the center of knee, diffuse swelling of the knee and distal femur with diffuse tenderness in the joint line and distal femur  areas, no obvious deformity but severely limited ROM due to pain. Distal strength and sensation grossly intact, compartments soft with intact distal pulses.  Neurological: She is alert and oriented to person, place, and time. She has normal strength. No sensory deficit.  Skin: Skin is warm and dry. Abrasion noted. No rash noted.  Psychiatric: She has a normal mood and affect.  Nursing note and vitals reviewed.    ED Course  Procedures (including critical care time) DIAGNOSTIC STUDIES: Oxygen Saturation is 99% on RA, normal by my interpretation.   COORDINATION OF CARE: 12:33 PM- Informed pt of femur fracture. Will consult orthopedist and will order pain medication. Pt verbalizes understanding and agrees to plan.  12:56 PM- Dr. Vear Clock returned page. Will proceed with preop labs, EKG, CXR and admission.   Medications  Tdap (BOOSTRIX) injection 0.5 mL (0.5 mLs Intramuscular Given 02/02/16 1302)  morphine 4 MG/ML injection 4 mg (4 mg Intravenous Given 02/02/16 1301)    Labs Review Labs Reviewed  CBC WITH DIFFERENTIAL/PLATELET  BASIC METABOLIC PANEL    Imaging Review Dg Knee Complete 4 Views Right  02/02/2016  CLINICAL DATA:  Tripped and fell on concrete this morning, EXAM: RIGHT KNEE - COMPLETE 4+ VIEW COMPARISON:  None. FINDINGS: Four views of the right knee submitted. There is right knee prosthesis with anatomic alignment. Mild displaced minimal angulated comminuted fracture in distal right femoral metaphysis above the level of prosthesis. IMPRESSION: There is right knee prosthesis with anatomic alignment. Mild displaced minimal angulated comminuted fracture in distal right femoral metaphysis above the level of prosthesis. Electronically Signed   By: Natasha Mead M.D.   On: 02/02/2016 12:22   I have personally reviewed and evaluated these images and lab results as part of my medical decision-making.   EKG Interpretation None      MDM   Final diagnoses:  Closed fracture of distal  end of right femur, unspecified fracture morphology, initial encounter (HCC)  Abrasion, knee, right, initial encounter  Fall, initial encounter  Essential hypertension    75 y.o. female here with mechanical fall onto R knee, +swelling, +abrasion, diffuse jointline tenderness and tenderness into the femur. Limited ROM due to pain. NVI with soft compartments. Xray obtained in triage, which shows comminuted displaced angulated distal femur fx above the prosthesis. +abrasion, will update tetanus. Will discuss case  with ortho, likely admit for surgical repair. Will order morphine and wait on labs until we discuss with ortho.  12:56 PM Dr. Vear Clock of [facility] returning page, would like hospitalist admit, pre op labs, CXR and EKG, and will post her for the OR tomorrow to surgically repair it. Wants long leg posterior splint placed now. Will change pain meds to IV. Will consult hospitalist for admission  1:24 PM Junious Silk NP for [facility] returning page, will admit. Please see her notes for further documentation of care. Labs and CXR/EKG pending. Pt's family updated and questions answered.  I personally performed the services described in this documentation, which was scribed in my presence. The recorded information has been reviewed and is accurate.  BP 163/93 mmHg  Pulse 71  Temp(Src) 98 F (36.7 C)  Resp 17  SpO2 99%  Meds ordered this encounter  Medications  . DISCONTD: morphine 4 MG/ML injection 4 mg    Sig:   . Tdap (BOOSTRIX) injection 0.5 mL    Sig:   . morphine 4 MG/ML injection 4 mg    Sig:         Thurmond Hildebran Camprubi-Soms, PA-C 02/02/16 1325  Jerelyn Scott, MD 02/02/16 1505

## 2016-02-02 NOTE — Progress Notes (Signed)
Orthopedic Tech Progress Note Patient Details:  Doris Pacheco 03/08/1941 [id number]  Ortho Devices Type of Ortho Device: Ace wrap, Long leg splint Ortho Device/Splint Interventions: Application   Doris Pacheco 02/02/2016, 1:17 PM  

## 2016-02-02 NOTE — ED Notes (Addendum)
States she tripped outside this morning and fell onto a brick patio. She fell onto her R knee. She c/o r knee since, pain increased with ambulation, abrasion is noted. She denies hip pain, head injury or any other complaints. She is alert and breathing easily

## 2016-02-02 NOTE — H&P (Signed)
History and Physical    Doris Pacheco:[id number] DOB: [birth year] DOA: 02/02/2016   PCP: Ralene Ok, MD   Patient coming from/Resides with: Private residence, lives alone  Chief Complaint: Fall with severe right leg pain  HPI: Doris Pacheco is a 75 y.o. female with medical history significant for hypertension, HTN with history of moderate concentric hypertrophy and associated left ventricular diastolic dysfunction, anxiety disorder, GERD, prior CVA, osteoarthritis, severe osteoporosis, and history of right TKA in 2010. Patient reports a mechanical fall today and landed on right leg and developed significant pain and therefore presented to the ER. She was not having any palpitations, dizziness chest pain or shortness of breath prior to onset of fall. She distinctly recalls tripping and falling.  ED Course:  PO temp 98.3-162/93-pulse 71-respirations 17-room air saturations 99% 2 view chest x-ray: No acute process Lab data: Sodium 136, potassium 3.4, BUN 18, creatinine 0.79, glucose 103, WBC 6500, neutrophils 81%, absolute and fills by 0.3%, hemoglobin 11.8, platelets 231,000. Morphine 4 mg IV 1 Tetanus booster 1  Review of Systems:  In addition to the HPI above,  No Fever-chills, myalgias or other constitutional symptoms No Headache, changes with Vision or hearing, new weakness, tingling, numbness in any extremity, No problems swallowing food or Liquids, indigestion/reflux No Chest pain, Cough or Shortness of Breath, palpitations, orthopnea or DOE No Abdominal pain, N/V; no melena or hematochezia No dysuria, hematuria or flank pain; recent outpatient treatment with Flagyl for bacterial vaginosis No new skin rashes, lesions, masses or bruises, No recent weight gain or loss No polyuria, polydypsia or polyphagia,   Past Medical History  Diagnosis Date  . Hypertension     takes meds daily  . Anxiety   . History of seasonal allergies   . Stroke (HCC)     tia  . GERD  (gastroesophageal reflux disease)   . Arthritis     Past Surgical History  Procedure Laterality Date  . Joint replacement      left hip, right knee  . Rotator cuff repair      bilateral  . Tonsillectomy    . Abdominal hysterectomy    . Intraocular lens insertion      right eye  . Patellectomy Right 10/26/2012    Procedure: PATELLECTOMY;  Surgeon: Nestor Lewandowsky, MD;  Location: [facility] OR;  Service: Orthopedics;  Laterality: Right;  . I&d knee with poly exchange Right 10/26/2012    Procedure: IRRIGATION AND DEBRIDEMENT KNEE WITH POLY EXCHANGE;  Surgeon: Nestor Lewandowsky, MD;  Location: [facility] OR;  Service: Orthopedics;  Laterality: Right;  poly exchange     reports that she has quit smoking. She has never used smokeless tobacco. She reports that she does not drink alcohol or use illicit drugs.  Mobility: Rolling walker Work history: Not obtained   Allergies  Allergen Reactions  . Forteo [Teriparatide (Recombinant)] Shortness Of Breath But patient has been able to tolerate and takes at home   . Tape Rash    Paper tape OK    Family History  Problem Relation Age of Onset  . Cancer Mother     COLON  . Diabetes Sister   . Stroke Sister   . Hypertension Father   . Pneumonia Father      Prior to Admission medications   Medication Sig Start Date End Date Taking? Authorizing Provider  atenolol (TENORMIN) 25 MG tablet Take 25 mg by mouth daily.    Historical Provider, MD  CALCIUM-VITAMIN D PO Take 1 tablet  by mouth daily.    Historical Provider, MD  diclofenac sodium (VOLTAREN) 1 % GEL Apply 2 g topically 2 (two) times daily as needed (pain).    Historical Provider, MD  esomeprazole (NEXIUM) 40 MG capsule Take 40 mg by mouth daily before breakfast.    Historical Provider, MD  estrogens, conjugated, (PREMARIN) 1.25 MG tablet Take 1.25 mg by mouth daily.    Historical Provider, MD  gabapentin (NEURONTIN) 300 MG capsule Take 300 mg by mouth 3 (three) times daily.    Historical Provider, MD    HYDROcodone-acetaminophen (NORCO) 10-325 MG tablet Take 1 tablet by mouth every 6 (six) hours as needed for moderate pain.    Historical Provider, MD  Insulin Pen Needle 32G X 4 MM MISC Pt to use once a day for with her Forteo pen 09/08/15   Ok Edwards, MD  LORazepam (ATIVAN) 0.5 MG tablet Take 0.5 mg by mouth every 8 (eight) hours.    Historical Provider, MD  meloxicam (MOBIC) 15 MG tablet Take 15 mg by mouth daily as needed for pain.    Historical Provider, MD  Meth-Hyo-M Bl-Na Phos-Ph Sal (URIBEL) 118 MG CAPS Take 1 capsule by mouth daily.    Historical Provider, MD  metroNIDAZOLE (FLAGYL) 500 MG tablet Take 1 tablet (500 mg total) by mouth 2 (two) times daily. 01/11/16   Ok Edwards, MD  montelukast (SINGULAIR) 10 MG tablet Take 10 mg by mouth at bedtime.    Historical Provider, MD  Pitavastatin Calcium (LIVALO) 2 MG TABS Take 2 mg by mouth daily.    Historical Provider, MD  Teriparatide, Recombinant, (FORTEO [place]) Inject into the skin. Reported on 10/07/2015    Historical Provider, MD  traMADol (ULTRAM) 50 MG tablet Take 50 mg by mouth every 6 (six) hours as needed for moderate pain.     Historical Provider, MD  triamterene-hydrochlorothiazide (MAXZIDE-25) 37.5-25 MG per tablet Take 1 tablet by mouth daily.    Historical Provider, MD  valsartan (DIOVAN) 320 MG tablet Take 320 mg by mouth daily.    Historical Provider, MD  vitamin C (ASCORBIC ACID) 500 MG tablet Take 500 mg by mouth daily.    Historical Provider, MD  VITAMIN E PO Take 1 capsule by mouth daily.     Historical Provider, MD   ** SEE PROGRESS NOTE 5/16 AT 213 PM FOR HANDWRITTEN LIST OF HOME MEDS**  Physical Exam: Filed Vitals:   02/02/16 1135  BP: 163/93  Pulse: 71  Temp: 98 F (36.7 C)  Resp: 17  SpO2: 99%      Constitutional: NAD,Anxious regarding need for upcoming surgery and uncomfortable secondary to persistent pain in right lower extremity Eyes: PERRL, lids and conjunctivae normal ENMT: Mucous membranes  are moist. Posterior pharynx clear of any exudate or lesions.Normal dentition.  Neck: normal, supple, no masses, no thyromegaly Respiratory: clear to auscultation bilaterally, no wheezing, no crackles. Normal respiratory effort. No accessory muscle use.  Cardiovascular: Regular rate and rhythm, no murmurs / rubs / gallops. No extremity edema. 2+ pedal pulses. No carotid bruits.  Abdomen: no tenderness, no masses palpated. No hepatosplenomegaly. Bowel sounds positive.  Musculoskeletal: no clubbing / cyanosis. No joint deformity upper extremities. Right lower extremity with swelling at the knee level and an abrasion on the anterior patellar surface/right leg extremely painful to touch but sensation and circulation intact to right lower extremity Good ROM all other extremities, no contractures. Normal muscle tone.  Skin: no rashes, lesions, ulcers. No induration Neurologic: CN 2-12  grossly intact. Sensation intact, Strength 5/5 x all 4 extremities except for weakness right lower extremity related to pain in recent fracture of femur.  Psychiatric: Normal judgment and insight. Alert and oriented x 3. Normal mood.    Labs on Admission: I have personally reviewed following labs and imaging studies  CBC:  Recent Labs Lab 02/02/16 1300  WBC 6.5  NEUTROABS 5.3  HGB 11.8*  HCT 36.3  MCV 82.9  PLT 231   Basic Metabolic Panel:  Recent Labs Lab 02/02/16 1300  NA 136  K 3.4*  CL 102  CO2 23  GLUCOSE 103*  BUN 18  CREATININE 0.79  CALCIUM 9.0   GFR: CrCl cannot be calculated (Unknown ideal weight.). Liver Function Tests: No results for input(s): AST, ALT, ALKPHOS, BILITOT, PROT, ALBUMIN in the last 168 hours. No results for input(s): LIPASE, AMYLASE in the last 168 hours. No results for input(s): AMMONIA in the last 168 hours. Coagulation Profile: No results for input(s): INR, PROTIME in the last 168 hours. Cardiac Enzymes: No results for input(s): CKTOTAL, CKMB, CKMBINDEX, TROPONINI  in the last 168 hours. BNP (last 3 results) No results for input(s): PROBNP in the last 8760 hours. HbA1C: No results for input(s): HGBA1C in the last 72 hours. CBG: No results for input(s): GLUCAP in the last 168 hours. Lipid Profile: No results for input(s): CHOL, HDL, LDLCALC, TRIG, CHOLHDL, LDLDIRECT in the last 72 hours. Thyroid Function Tests: No results for input(s): TSH, T4TOTAL, FREET4, T3FREE, THYROIDAB in the last 72 hours. Anemia Panel: No results for input(s): VITAMINB12, FOLATE, FERRITIN, TIBC, IRON, RETICCTPCT in the last 72 hours. Urine analysis:    Component Value Date/Time   COLORURINE GREEN* 07/06/2014 1240   APPEARANCEUR CLEAR 07/06/2014 1240   LABSPEC 1.022 07/06/2014 1240   PHURINE 6.0 07/06/2014 1240   GLUCOSEU NEGATIVE 07/06/2014 1240   HGBUR NEGATIVE 07/06/2014 1240   BILIRUBINUR NEGATIVE 07/06/2014 1240   KETONESUR NEGATIVE 07/06/2014 1240   PROTEINUR NEGATIVE 07/06/2014 1240   UROBILINOGEN 0.2 07/06/2014 1240   NITRITE NEGATIVE 07/06/2014 1240   LEUKOCYTESUR NEGATIVE 07/06/2014 1240   Sepsis Labs: @LABRCNTIP (procalcitonin:4,lacticidven:4) )No results found for this or any previous visit (from the past 240 hour(s)).   Radiological Exams on Admission: Dg Chest 2 View  02/02/2016  CLINICAL DATA:  Preop evaluation for broken right femur EXAM: CHEST  2 VIEW COMPARISON:  07/25/2015 FINDINGS: The heart size and mediastinal contours are within normal limits. Both lungs are clear. The visualized skeletal structures shows stable degenerative changes of the thoracic spine with increase kyphosis. IMPRESSION: No active cardiopulmonary disease. Electronically Signed   By: Alcide CleverMark  Lukens M.D.   On: 02/02/2016 13:25   Dg Knee Complete 4 Views Right  02/02/2016  CLINICAL DATA:  Tripped and fell on concrete this morning, EXAM: RIGHT KNEE - COMPLETE 4+ VIEW COMPARISON:  None. FINDINGS: Four views of the right knee submitted. There is right knee prosthesis with anatomic  alignment. Mild displaced minimal angulated comminuted fracture in distal right femoral metaphysis above the level of prosthesis. IMPRESSION: There is right knee prosthesis with anatomic alignment. Mild displaced minimal angulated comminuted fracture in distal right femoral metaphysis above the level of prosthesis. Electronically Signed   By: Natasha MeadLiviu  Pop M.D.   On: 02/02/2016 12:22    EKG: (Independently reviewed) sinus rhythm with ventricular rate 69 bpm, QTC 426 ms, no ischemic changes and completely normal EKG  Assessment/Plan Principal Problem:   Fracture of femur, distal, right, closed /  Osteoporosis -Patient with mechanical  fall and subsequent distal femur fracture -[employer] to apply splint -Formal orthopedic surgeon evaluation pending- NPO for now -IV morphine for pain -Preoperative vitamin D, calcium and albumin levels  -Continue preadmission calcium with vitamin D and Forteo -anticipate PT/OT evaluation postoperatively -Timing of surgical procedure not yet determined so we'll utilize SCDs for now -Patient on estrogen based hormone replacement prior to admission -Preoperative EKG and chest x-ray unremarkable  Active Problems:   Loose total knee arthroplasty - right /Arthritis -Patient with recent issues of tightness and pain above-knee and has recently been started on Voltaren gel and gabapentin -Also was on Vicodin and meloxicam prior to admission.     Hypertension/Left ventricular diastolic dysfunction -Prior to admission was taking Maxide which we will hold while NPO -Continue Diovan (with therapeutic substitution), atenolol and hydralazine    History of CVA (cerebrovascular accident) -Continue Livalo and lovastatin     Anxiety -Recently started on Ativan tabs    GERD (gastroesophageal reflux disease) -Therapeutic substitution for Nexium    Anemia -Hgb stable and at baseline of between 10 and 11      DVT prophylaxis: SCDs preoperatively Code Status: DO NOT  RESUSCITATE Family Communication: Daughter, Jerita Wimbush at bedside Disposition Plan: Anticipate eventual discharge to home environment once medically stable: Pending PT/OT evaluation patient may require short-term rehabilitation stay postoperatively Consults called: Orthopedics/ Vear Clock w/ [facility] Admission status: Inpatient/medical floor    ELLIS,ALLISON L. ANP-BC [facility] Pager [phone]   If 7PM-7AM, please contact night-coverage [email or web address] Password [email or web address]  02/02/2016, 1:56 PM

## 2016-02-02 NOTE — Progress Notes (Signed)
Complete list of patient's home medications:

## 2016-02-03 ENCOUNTER — Encounter (HOSPITAL_COMMUNITY)
Admission: EM | Disposition: A | Discharge status: Skilled Nursing Facility | Payer: Self-pay | Source: Home / Self Care | Attending: Internal Medicine

## 2016-02-03 ENCOUNTER — Inpatient Hospital Stay (HOSPITAL_COMMUNITY): Payer: Medicare HMO

## 2016-02-03 ENCOUNTER — Inpatient Hospital Stay (HOSPITAL_COMMUNITY): Payer: Medicare HMO | Admitting: Anesthesiology

## 2016-02-03 DIAGNOSIS — D508 Other iron deficiency anemias: Secondary | ICD-10-CM

## 2016-02-03 DIAGNOSIS — S80211A Abrasion, right knee, initial encounter: Secondary | ICD-10-CM

## 2016-02-03 DIAGNOSIS — S80219A Abrasion, unspecified knee, initial encounter: Secondary | ICD-10-CM | POA: Insufficient documentation

## 2016-02-03 DIAGNOSIS — S72401A Unspecified fracture of lower end of right femur, initial encounter for closed fracture: Secondary | ICD-10-CM | POA: Diagnosis present

## 2016-02-03 HISTORY — PX: ORIF FEMUR FRACTURE: SHX2119

## 2016-02-03 LAB — CBC
HEMATOCRIT: 31 % — AB (ref 36.0–46.0)
Hemoglobin: 10.2 g/dL — ABNORMAL LOW (ref 12.0–15.0)
MCH: 27.5 pg (ref 26.0–34.0)
MCHC: 32.9 g/dL (ref 30.0–36.0)
MCV: 83.6 fL (ref 78.0–100.0)
PLATELETS: 187 10*3/uL (ref 150–400)
RBC: 3.71 MIL/uL — ABNORMAL LOW (ref 3.87–5.11)
RDW: 15.2 % (ref 11.5–15.5)
WBC: 7 10*3/uL (ref 4.0–10.5)

## 2016-02-03 LAB — BASIC METABOLIC PANEL
ANION GAP: 11 (ref 5–15)
BUN: 16 mg/dL (ref 6–20)
CALCIUM: 8.4 mg/dL — AB (ref 8.9–10.3)
CO2: 23 mmol/L (ref 22–32)
CREATININE: 0.86 mg/dL (ref 0.44–1.00)
Chloride: 105 mmol/L (ref 101–111)
Glucose, Bld: 99 mg/dL (ref 65–99)
Potassium: 4.1 mmol/L (ref 3.5–5.1)
SODIUM: 139 mmol/L (ref 135–145)

## 2016-02-03 LAB — SURGICAL PCR SCREEN
MRSA, PCR: NEGATIVE
STAPHYLOCOCCUS AUREUS: NEGATIVE

## 2016-02-03 LAB — VITAMIN D 25 HYDROXY (VIT D DEFICIENCY, FRACTURES): VIT D 25 HYDROXY: 47.6 ng/mL (ref 30.0–100.0)

## 2016-02-03 SURGERY — OPEN REDUCTION INTERNAL FIXATION (ORIF) DISTAL FEMUR FRACTURE
Anesthesia: General | Site: Leg Upper | Laterality: Right

## 2016-02-03 MED ORDER — ASPIRIN EC 325 MG PO TBEC: 325.0000 mg | DELAYED_RELEASE_TABLET | Freq: Two times a day (BID) | ORAL | Status: DC

## 2016-02-03 MED ORDER — EPHEDRINE SULFATE 50 MG/ML IJ SOLN
INTRAMUSCULAR | Status: DC | PRN
  Administered 2016-02-03: 10 mg via INTRAVENOUS
  Administered 2016-02-03 (×2): 5 mg via INTRAVENOUS

## 2016-02-03 MED ORDER — PROPOFOL 10 MG/ML IV BOLUS
INTRAVENOUS | Status: DC | PRN
  Administered 2016-02-03: 150 mg via INTRAVENOUS

## 2016-02-03 MED ORDER — METOCLOPRAMIDE HCL 5 MG PO TABS: 5.0000 mg | ORAL_TABLET | Freq: Three times a day (TID) | ORAL | Status: DC | PRN

## 2016-02-03 MED ORDER — KCL IN DEXTROSE-NACL 20-5-0.45 MEQ/L-%-% IV SOLN
INTRAVENOUS | Status: AC
  Filled 2016-02-03: qty 1000

## 2016-02-03 MED ORDER — PHENYLEPHRINE 40 MCG/ML (10ML) SYRINGE FOR IV PUSH (FOR BLOOD PRESSURE SUPPORT)
PREFILLED_SYRINGE | INTRAVENOUS | Status: AC
  Filled 2016-02-03: qty 10

## 2016-02-03 MED ORDER — PHENOL 1.4 % MT LIQD
1.0000 | OROMUCOSAL | Status: DC | PRN
Start: 2016-02-03 — End: 2016-02-08

## 2016-02-03 MED ORDER — ONDANSETRON HCL 4 MG/2ML IJ SOLN
INTRAMUSCULAR | Status: DC | PRN
  Administered 2016-02-03: 4 mg via INTRAVENOUS

## 2016-02-03 MED ORDER — ACETAMINOPHEN 325 MG PO TABS
650.0000 mg | ORAL_TABLET | Freq: Four times a day (QID) | ORAL | Status: DC | PRN
  Administered 2016-02-08: 650 mg via ORAL
  Filled 2016-02-03: qty 2

## 2016-02-03 MED ORDER — PHENYLEPHRINE HCL 10 MG/ML IJ SOLN
INTRAMUSCULAR | Status: AC
  Filled 2016-02-03: qty 1

## 2016-02-03 MED ORDER — OXYCODONE-ACETAMINOPHEN 5-325 MG PO TABS: 1.0000 | ORAL_TABLET | ORAL | Status: DC | PRN

## 2016-02-03 MED ORDER — FENTANYL CITRATE (PF) 100 MCG/2ML IJ SOLN
25.0000 ug | INTRAMUSCULAR | Status: DC | PRN
  Administered 2016-02-03 (×2): 50 ug via INTRAVENOUS

## 2016-02-03 MED ORDER — KCL IN DEXTROSE-NACL 20-5-0.45 MEQ/L-%-% IV SOLN
INTRAVENOUS | Status: DC
  Administered 2016-02-03: 16:00:00 via INTRAVENOUS
  Administered 2016-02-04: 100 mL/h via INTRAVENOUS
  Filled 2016-02-03: qty 1000

## 2016-02-03 MED ORDER — LACTATED RINGERS IV SOLN: INTRAVENOUS | Status: DC

## 2016-02-03 MED ORDER — MIDAZOLAM HCL 5 MG/5ML IJ SOLN
INTRAMUSCULAR | Status: DC | PRN
  Administered 2016-02-03: 2 mg via INTRAVENOUS

## 2016-02-03 MED ORDER — ASPIRIN EC 325 MG PO TBEC
325.0000 mg | DELAYED_RELEASE_TABLET | Freq: Two times a day (BID) | ORAL | Status: DC
  Administered 2016-02-04 – 2016-02-08 (×9): 325 mg via ORAL
  Filled 2016-02-03 (×9): qty 1

## 2016-02-03 MED ORDER — FENTANYL CITRATE (PF) 250 MCG/5ML IJ SOLN
INTRAMUSCULAR | Status: AC
Start: 2016-02-03 — End: 2016-02-03
  Filled 2016-02-03: qty 5

## 2016-02-03 MED ORDER — TIZANIDINE HCL 2 MG PO CAPS: 2.0000 mg | ORAL_CAPSULE | Freq: Three times a day (TID) | ORAL | Status: DC

## 2016-02-03 MED ORDER — ACETAMINOPHEN 650 MG RE SUPP: 650.0000 mg | Freq: Four times a day (QID) | RECTAL | Status: DC | PRN

## 2016-02-03 MED ORDER — PHENYLEPHRINE HCL 10 MG/ML IJ SOLN
INTRAMUSCULAR | Status: DC | PRN
  Administered 2016-02-03: 80 ug via INTRAVENOUS
  Administered 2016-02-03: 40 ug via INTRAVENOUS
  Administered 2016-02-03 (×2): 80 ug via INTRAVENOUS

## 2016-02-03 MED ORDER — PROPOFOL 10 MG/ML IV BOLUS
INTRAVENOUS | Status: AC
Start: 2016-02-03 — End: 2016-02-03
  Filled 2016-02-03: qty 20

## 2016-02-03 MED ORDER — ROCURONIUM BROMIDE 100 MG/10ML IV SOLN
INTRAVENOUS | Status: DC | PRN
  Administered 2016-02-03: 40 mg via INTRAVENOUS

## 2016-02-03 MED ORDER — SUGAMMADEX SODIUM 200 MG/2ML IV SOLN
INTRAVENOUS | Status: DC | PRN
  Administered 2016-02-03: 100 mg via INTRAVENOUS

## 2016-02-03 MED ORDER — 0.9 % SODIUM CHLORIDE (POUR BTL) OPTIME
TOPICAL | Status: DC | PRN
  Administered 2016-02-03: 1000 mL

## 2016-02-03 MED ORDER — LACTATED RINGERS IV SOLN
INTRAVENOUS | Status: DC | PRN
  Administered 2016-02-03: 12:00:00 via INTRAVENOUS

## 2016-02-03 MED ORDER — SUGAMMADEX SODIUM 200 MG/2ML IV SOLN
INTRAVENOUS | Status: AC
  Filled 2016-02-03: qty 2

## 2016-02-03 MED ORDER — LIDOCAINE HCL (CARDIAC) 20 MG/ML IV SOLN
INTRAVENOUS | Status: DC | PRN
  Administered 2016-02-03: 30 mg via INTRAVENOUS

## 2016-02-03 MED ORDER — SODIUM CHLORIDE 0.9 % IV SOLN
10.0000 mg | INTRAVENOUS | Status: DC | PRN
  Administered 2016-02-03: 15 ug/min via INTRAVENOUS

## 2016-02-03 MED ORDER — FENTANYL CITRATE (PF) 100 MCG/2ML IJ SOLN
INTRAMUSCULAR | Status: DC | PRN
  Administered 2016-02-03: 50 ug via INTRAVENOUS

## 2016-02-03 MED ORDER — MIDAZOLAM HCL 2 MG/2ML IJ SOLN
INTRAMUSCULAR | Status: AC
  Filled 2016-02-03: qty 2

## 2016-02-03 MED ORDER — SUGAMMADEX SODIUM 500 MG/5ML IV SOLN
INTRAVENOUS | Status: AC
  Filled 2016-02-03: qty 5

## 2016-02-03 MED ORDER — ARTIFICIAL TEARS OP OINT
TOPICAL_OINTMENT | OPHTHALMIC | Status: AC
  Filled 2016-02-03: qty 10.5

## 2016-02-03 MED ORDER — HYDRALAZINE HCL 10 MG PO TABS
10.0000 mg | ORAL_TABLET | Freq: Four times a day (QID) | ORAL | Status: DC
  Administered 2016-02-04 – 2016-02-08 (×16): 10 mg via ORAL
  Filled 2016-02-03 (×19): qty 1

## 2016-02-03 MED ORDER — HYDROMORPHONE HCL 1 MG/ML IJ SOLN: 0.5000 mg | Freq: Once | INTRAMUSCULAR | Status: DC

## 2016-02-03 MED ORDER — HYDRALAZINE HCL 20 MG/ML IJ SOLN: 5.0000 mg | INTRAMUSCULAR | Status: DC | PRN

## 2016-02-03 MED ORDER — METOCLOPRAMIDE HCL 5 MG/ML IJ SOLN: 5.0000 mg | Freq: Three times a day (TID) | INTRAMUSCULAR | Status: DC | PRN

## 2016-02-03 MED ORDER — MENTHOL 3 MG MT LOZG: 1.0000 | LOZENGE | OROMUCOSAL | Status: DC | PRN

## 2016-02-03 MED ORDER — MEPERIDINE HCL 25 MG/ML IJ SOLN: 6.2500 mg | INTRAMUSCULAR | Status: DC | PRN

## 2016-02-03 MED ORDER — PHENYLEPHRINE 40 MCG/ML (10ML) SYRINGE FOR IV PUSH (FOR BLOOD PRESSURE SUPPORT)
PREFILLED_SYRINGE | INTRAVENOUS | Status: AC
Start: 2016-02-03 — End: 2016-02-03
  Filled 2016-02-03: qty 10

## 2016-02-03 MED ORDER — FENTANYL CITRATE (PF) 100 MCG/2ML IJ SOLN
INTRAMUSCULAR | Status: AC
  Filled 2016-02-03: qty 2

## 2016-02-03 MED ORDER — ARTIFICIAL TEARS OP OINT
TOPICAL_OINTMENT | OPHTHALMIC | Status: DC | PRN
  Administered 2016-02-03: 1 via OPHTHALMIC

## 2016-02-03 SURGICAL SUPPLY — 69 items
BANDAGE ACE 6X5 VEL STRL LF (GAUZE/BANDAGES/DRESSINGS) ×1 IMPLANT
BIT DRILL CALIBRATED 4.3MMX365 (DRILL) IMPLANT
BLADE SURG 10 STRL SS (BLADE) ×2 IMPLANT
BNDG ADH 5X3 H2O RPLNT NS (GAUZE/BANDAGES/DRESSINGS) ×1
BNDG COHESIVE 3X5 WHT NS (GAUZE/BANDAGES/DRESSINGS) ×1 IMPLANT
BNDG COHESIVE 4X5 TAN STRL (GAUZE/BANDAGES/DRESSINGS) ×2 IMPLANT
CUFF TOURNIQUET SINGLE 34IN LL (TOURNIQUET CUFF) IMPLANT
CUFF TOURNIQUET SINGLE 44IN (TOURNIQUET CUFF) IMPLANT
DRAPE IMP U-DRAPE 54X76 (DRAPES) ×2 IMPLANT
DRAPE ORTHO SPLIT 77X108 STRL (DRAPES) ×4
DRAPE PROXIMA HALF (DRAPES) ×2 IMPLANT
DRAPE SURG 17X23 STRL (DRAPES) ×2 IMPLANT
DRAPE SURG ORHT 6 SPLT 77X108 (DRAPES) ×2 IMPLANT
DRAPE U-SHAPE 47X51 STRL (DRAPES) ×2 IMPLANT
DRESSING AQUACEL AQ EXTRA 4X5 (GAUZE/BANDAGES/DRESSINGS) IMPLANT
DRILL CALIBRATED 4.3MMX365 (DRILL) ×2
DRSG ADAPTIC 3X8 NADH LF (GAUZE/BANDAGES/DRESSINGS) ×2 IMPLANT
DRSG MEPILEX BORDER 4X4 (GAUZE/BANDAGES/DRESSINGS) ×1 IMPLANT
DRSG PAD ABDOMINAL 8X10 ST (GAUZE/BANDAGES/DRESSINGS) ×2 IMPLANT
DURAPREP 26ML APPLICATOR (WOUND CARE) ×2 IMPLANT
ELECT CAUTERY BLADE 6.4 (BLADE) ×2 IMPLANT
ELECT REM PT RETURN 9FT ADLT (ELECTROSURGICAL) ×2
ELECTRODE REM PT RTRN 9FT ADLT (ELECTROSURGICAL) ×1 IMPLANT
EVACUATOR 1/8 PVC DRAIN (DRAIN) IMPLANT
GAUZE SPONGE 4X4 12PLY STRL (GAUZE/BANDAGES/DRESSINGS) ×2 IMPLANT
GLOVE BIO SURGEON STRL SZ 6.5 (GLOVE) ×2 IMPLANT
GLOVE BIO SURGEON STRL SZ7.5 (GLOVE) ×2 IMPLANT
GLOVE BIO SURGEON STRL SZ8.5 (GLOVE) ×2 IMPLANT
GLOVE BIOGEL PI IND STRL 8 (GLOVE) ×1 IMPLANT
GLOVE BIOGEL PI IND STRL 9 (GLOVE) ×1 IMPLANT
GLOVE BIOGEL PI INDICATOR 8 (GLOVE) ×1
GLOVE BIOGEL PI INDICATOR 9 (GLOVE) ×1
GOWN STRL REUS W/ TWL LRG LVL3 (GOWN DISPOSABLE) ×3 IMPLANT
GOWN STRL REUS W/ TWL XL LVL3 (GOWN DISPOSABLE) ×1 IMPLANT
GOWN STRL REUS W/TWL LRG LVL3 (GOWN DISPOSABLE) ×6
GOWN STRL REUS W/TWL XL LVL3 (GOWN DISPOSABLE) ×2
GUIDEWIRE BEAD TIP (WIRE) ×1 IMPLANT
IMMOBILIZER KNEE 20 (SOFTGOODS) ×2
IMMOBILIZER KNEE 20 THIGH 36 (SOFTGOODS) IMPLANT
KIT BASIN OR (CUSTOM PROCEDURE TRAY) ×2 IMPLANT
KIT ROOM TURNOVER OR (KITS) ×2 IMPLANT
MANIFOLD NEPTUNE II (INSTRUMENTS) ×2 IMPLANT
NAIL FEM RETRO 9X400 (Nail) ×1 IMPLANT
NS IRRIG 1000ML POUR BTL (IV SOLUTION) ×2 IMPLANT
PACK GENERAL/GYN (CUSTOM PROCEDURE TRAY) ×2 IMPLANT
PACK UNIVERSAL I (CUSTOM PROCEDURE TRAY) ×2 IMPLANT
PAD ARMBOARD 7.5X6 YLW CONV (MISCELLANEOUS) ×4 IMPLANT
PIN GUIDE 3.2 903003004 (MISCELLANEOUS) ×2 IMPLANT
SCREW CORT TI DBL LEAD 5X32 (Screw) ×1 IMPLANT
SCREW CORT TI DBL LEAD 5X48 (Screw) ×2 IMPLANT
SCREW CORT TI DBL LEAD 5X65 (Screw) ×1 IMPLANT
SCREW CORT TI DBL LEAD 5X70 (Screw) ×1 IMPLANT
SCREW CORT TI DBL LEAD 5X80 (Screw) ×1 IMPLANT
SCREW CORT TI DBLE LEAD 5X52 (Screw) ×1 IMPLANT
STAPLER VISISTAT 35W (STAPLE) ×2 IMPLANT
STOCKINETTE IMPERVIOUS 9X36 MD (GAUZE/BANDAGES/DRESSINGS) ×2 IMPLANT
SUCTION FRAZIER HANDLE 10FR (MISCELLANEOUS) ×1
SUCTION TUBE FRAZIER 10FR DISP (MISCELLANEOUS) ×1 IMPLANT
SUT VIC AB 0 CT1 27 (SUTURE) ×4
SUT VIC AB 0 CT1 27XBRD ANBCTR (SUTURE) ×3 IMPLANT
SUT VIC AB 1 CT1 27 (SUTURE) ×2
SUT VIC AB 1 CT1 27XBRD ANBCTR (SUTURE) ×1 IMPLANT
SUT VIC AB 2-0 CT1 27 (SUTURE) ×2
SUT VIC AB 2-0 CT1 TAPERPNT 27 (SUTURE) ×1 IMPLANT
SUT VIC AB 3-0 SH 27 (SUTURE) ×2
SUT VIC AB 3-0 SH 27X BRD (SUTURE) IMPLANT
TOWEL OR 17X24 6PK STRL BLUE (TOWEL DISPOSABLE) ×2 IMPLANT
TOWEL OR 17X26 10 PK STRL BLUE (TOWEL DISPOSABLE) ×2 IMPLANT
WATER STERILE IRR 1000ML POUR (IV SOLUTION) ×2 IMPLANT

## 2016-02-03 NOTE — Op Note (Signed)
Pre Op Dx: Right distal femur close periprosthetic fracture   Post Op Dx: same   Procedure: open reduction internal fixation right distal femur periprosthetic fracture with a Biomet [place] nail 9 mm x 40 cm 4 distal locking screws one proximal nonlocking screw   Surgeon: Nestor Lewandowsky, MD  Assistant: Tomi Likens. Gaylene Brooks  (present throughout entire procedure and necessary for timely completion of the procedure)  Anesthesia: General  EBL: 100 mL   Fluids: 1200 mL crystalloid   IIndications: 75 year old female sustained a right distal femur periprosthetic fracture yesterday when she tripped on the interface between the grafts and the sidewalk she landed directly on her right knee and sustained an oblique minimally angulated 20% displaced distal femur fracture just above the total knee prosthesis. Her primary total knee was done by Dr. Wyline Mood in 2008, I revise a grossly loose patella component with a papillectomy and quad repair in 2014 and she did well until yesterday. She is made about the hospitalist service stabilized and prepared for surgical intervention to stabilize her distal femur fracture. Plan is to proceed with a intramedullary Biomet [place] nail go through the central hole and the prosthesis with distal locking screws and a proximal nonlocking screw risks and benefits of surgery discussed with the patient and her family. The chance of this healing without surgical intervention is well under 50%.   Procedure: Patient identified by armband and taken to the holding area at [facility] receive perioperative antibiotics 2 g Ancef. She was taken to operating room 10 appropriate anesthetic monitors were attached and with the patient and her transfer gurney general endotracheal anesthesia was induced. She was then transferred to a radiolucent table the right lower extremity prepped and draped in usual sterile fashion from the ankle to the right groin. A timeout procedure was performed  the limb was then placed on a radiolucent aluminum triangle down to about 40 and aren't better C-arm imaging control we made an anterior midline incision that was coaxial with the central hole in the femoral component and the femoral shaft. Through this 2 cm incision a guide pin was passed up into the notch of the femoral component through the plastic plug and cement plug in the distal femur and up into the femoral shaft this was then overreamed with a 12 and half millimeter initiating reamer from the Biomet Phoenix nail set distally and proximally we reamed up to 10-1/2 mm with the flexible reamers. We then measured for a 40 cm x 9 mm Biomet Phoenix nail which was loaded on the inserting device passed over the guidewire through the femoral component the fracture site and up into the femur. This was done under C-arm image control. We then made stab wounds for the 4 locking screws and through the 2 proximal stab wounds used to the [facility] tong clamp to further reduce the fracture. The distal locking screws were then placed transverse and distal angled screw and then the King tong clamp removed the proximal transverse and proximal angled screw are also placed under C-arm image control. Satisfied with the position of the reduction the distal locking mechanism was activated fixating the screws. We then went proximally and using the perfect circle technique placed a proximal screw through the proximal distal hole of the rod again under C-arm image control. Final C-arm images were made of the intramedullary nail wounds irrigated out normal saline solution and closed in layers with 2-0 subcutaneous Vicryl suture and 3-0 subcuticular Vicryl suture. Dressing of  Xerofoam 4 x 4 dressing sponges web roll and Ace wrap was then applied patient was awakened extubated and taken to the recovery without difficulty.

## 2016-02-03 NOTE — Interval H&P Note (Signed)
History and Physical Interval Note:  02/03/2016 1:41 PM  Doris Pacheco  has presented today for surgery, with the diagnosis of RIGHT DISTAL FEMUR PERIPROSTHETIC FRACTURE  The various methods of treatment have been discussed with the patient and family. After consideration of risks, benefits and other options for treatment, the patient has consented to  Procedure(s): OPEN REDUCTION INTERNAL FIXATION (ORIF) DISTAL FEMUR FRACTURE (Right) as a surgical intervention .  The patient's history has been reviewed, patient examined, no change in status, stable for surgery.  I have reviewed the patient's chart and labs.  Questions were answered to the patient's satisfaction.     Nestor LewandowskyOWAN,[name] J

## 2016-02-03 NOTE — Progress Notes (Signed)
Called short stay and gave report

## 2016-02-03 NOTE — Progress Notes (Signed)
[facility] PROGRESS NOTE  Doris Pacheco ZOX:[id number] DOB: 07-19-1941 DOA: 02/02/2016   PCP: Ralene Ok, MD     Assessment/Plan: Principal Problem:   Fracture of femur, distal, right, closed (HCC) Active Problems:   Loose total knee arthroplasty - right    Hypertension   Anxiety   History of CVA (cerebrovascular accident)   GERD (gastroesophageal reflux disease)   Arthritis   Osteoporosis   Left ventricular diastolic dysfunction   Anemia   Femoral distal fracture, right, closed, initial encounter   Abrasion, knee   75 y.o. female with medical history significant for hypertension, HTN with history of moderate concentric hypertrophy and associated left ventricular diastolic dysfunction, anxiety disorder, GERD, prior CVA, osteoarthritis, severe osteoporosis, and history of right TKA in 2010. Patient reports a mechanical fall today and landed on right leg and developed significant pain and therefore presented to the ER. She was not having any palpitations, dizziness chest pain or shortness of breath prior to onset of fall.  Assessment and plan Fracture of femur, distal, right, closed / Osteoporosis -Patient with mechanical fall and subsequent distal femur fracture -[employer] to apply splint Patient seen by Dr.Rowan, patient scheduled for open reduction internal fixation -IV morphine for pain Vitamin D levels okay -Continue preadmission calcium with vitamin D and Forteo -anticipate PT/OT evaluation postoperatively SCDs, postop DVT prophylaxis per orthopedics -Patient on estrogen based hormone replacement prior to admission -Preoperative EKG and chest x-ray unremarkable     Loose total knee arthroplasty - right /Arthritis -Patient with recent issues of tightness and pain above-knee and has recently been started on Voltaren gel and gabapentin -Also was on Vicodin and meloxicam prior to admission.    Hypertension/Left ventricular diastolic dysfunction -Prior to  admission was taking Maxide which we will hold while NPO -Continue Diovan (with therapeutic substitution), atenolol and prn  hydralazine   History of CVA (cerebrovascular accident) -Continue Livalo and lovastatin   Anxiety -Recently started on Ativan tabs   GERD (gastroesophageal reflux disease) -Therapeutic substitution for Nexium   Anemia -Hgb stable and at baseline of between 10 and 11 Continue to follow    DVT prophylaxsis SCDs  Code Status:  DO NOT RESUSCITATE     Family Communication: Discussed in detail with the patient, all imaging results, lab results explained to the patient   Disposition Plan:  As per orthopedics     Consultants:  Orthopedics    Procedures:     Antibiotics: Anti-infectives    Start     Dose/Rate Route Frequency Ordered Stop   02/03/16 0600  ceFAZolin (ANCEF) IVPB 2g/100 mL premix     2 g 200 mL/hr over 30 Minutes Intravenous On call to O.R. 02/02/16 1803 02/04/16 0559         HPI/Subjective: Patient denies any pain, resting comfortably  Objective: Filed Vitals:   02/02/16 1518 02/02/16 1753 02/02/16 2036 02/03/16 0512  BP: 169/77 180/64 130/59 132/57  Pulse: 79 89 83 70  Temp:  98.1 F (36.7 C) 98.1 F (36.7 C) 98.6 F (37 C)  TempSrc:  Oral Oral Oral  Resp: SpO2: 100% 100% 99% 100%    Intake/Output Summary (Last 24 hours) at 02/03/16 0915 Last data filed at 02/02/16 1626  Gross per 24 hour  Intake      0 ml  Output    200 ml  Net   -200 ml    Exam:  Examination:  General exam: Appears calm and comfortable  Respiratory system: Clear to auscultation. Respiratory effort normal. Cardiovascular system: S1 & S2 heard, RRR. No JVD, murmurs, rubs, gallops or clicks. No pedal edema. Gastrointestinal system: Abdomen is nondistended, soft and nontender. No organomegaly or masses felt. Normal bowel sounds heard. Central nervous system: Alert and oriented. No focal neurological  deficits. Extremities: Symmetric 5 x 5 power. Skin: No rashes, lesions or ulcers Psychiatry: Judgement and insight appear normal. Mood & affect appropriate.     Data Reviewed: I have personally reviewed following labs and imaging studies  Micro Results Recent Results (from the past 240 hour(s))  Surgical pcr screen     Status: None   Collection Time: 02/03/16  3:47 AM  Result Value Ref Range Status   MRSA, PCR NEGATIVE NEGATIVE Final   Staphylococcus aureus NEGATIVE NEGATIVE Final    Comment:        The Xpert SA Assay (FDA approved for NASAL specimens in patients over 74 years of age), is one component of a comprehensive surveillance program.  Test performance has been validated by [facility] for patients greater than or equal to 11027 year old. It is not intended to diagnose infection nor to guide or monitor treatment.     Radiology Reports Dg Chest 2 View  02/02/2016  CLINICAL DATA:  Preop evaluation for broken right femur EXAM: CHEST  2 VIEW COMPARISON:  07/25/2015 FINDINGS: The heart size and mediastinal contours are within normal limits. Both lungs are clear. The visualized skeletal structures shows stable degenerative changes of the thoracic spine with increase kyphosis. IMPRESSION: No active cardiopulmonary disease. Electronically Signed   By: Alcide CleverMark  Lukens M.D.   On: 02/02/2016 13:25   Dg Knee Complete 4 Views Right  02/02/2016  CLINICAL DATA:  Tripped and fell on concrete this morning, EXAM: RIGHT KNEE - COMPLETE 4+ VIEW COMPARISON:  None. FINDINGS: Four views of the right knee submitted. There is right knee prosthesis with anatomic alignment. Mild displaced minimal angulated comminuted fracture in distal right femoral metaphysis above the level of prosthesis. IMPRESSION: There is right knee prosthesis with anatomic alignment. Mild displaced minimal angulated comminuted fracture in distal right femoral metaphysis above the level of prosthesis. Electronically Signed   By:  Natasha MeadLiviu  Pop M.D.   On: 02/02/2016 12:22     CBC  Recent Labs Lab 02/02/16 1300 02/03/16 0544  WBC 6.5 7.0  HGB 11.8* 10.2*  HCT 36.3 31.0*  PLT 231 187  MCV 82.9 83.6  MCH 26.9 27.5  MCHC 32.5 32.9  RDW 15.0 15.2  LYMPHSABS 0.9  --   MONOABS 0.4  --   EOSABS 0.0  --   BASOSABS 0.0  --     Chemistries   Recent Labs Lab 02/02/16 1300 02/02/16 1448 02/03/16 0544  NA 136  --  139  K 3.4*  --  4.1  CL 102  --  105  CO2 23  --  23  GLUCOSE 103*  --  99  BUN 18  --  16  CREATININE 0.79  --  0.86  CALCIUM 9.0 8.9 8.4*   ------------------------------------------------------------------------------------------------------------------ CrCl cannot be calculated (Unknown ideal weight.). ------------------------------------------------------------------------------------------------------------------ No results for input(s): HGBA1C in the last 72 hours. ------------------------------------------------------------------------------------------------------------------ No results for input(s): CHOL, HDL, LDLCALC, TRIG, CHOLHDL, LDLDIRECT in the last 72 hours. ------------------------------------------------------------------------------------------------------------------ No results for input(s): TSH, T4TOTAL, T3FREE, THYROIDAB in the last 72 hours.  Invalid input(s): FREET3 ------------------------------------------------------------------------------------------------------------------ No results for input(s): VITAMINB12, FOLATE, FERRITIN, TIBC, IRON, RETICCTPCT in the last 72 hours.  Coagulation profile  Recent Labs Lab 02/02/16 1448  INR 1.04    No results for input(s): DDIMER in the last 72 hours.  Cardiac Enzymes No results for input(s): CKMB, TROPONINI, MYOGLOBIN in the last 168 hours.  Invalid input(s): CK ------------------------------------------------------------------------------------------------------------------ Invalid input(s): POCBNP   CBG: No  results for input(s): GLUCAP in the last 168 hours.     Studies: Dg Chest 2 View  02/02/2016  CLINICAL DATA:  Preop evaluation for broken right femur EXAM: CHEST  2 VIEW COMPARISON:  07/25/2015 FINDINGS: The heart size and mediastinal contours are within normal limits. Both lungs are clear. The visualized skeletal structures shows stable degenerative changes of the thoracic spine with increase kyphosis. IMPRESSION: No active cardiopulmonary disease. Electronically Signed   By: Alcide Clever M.D.   On: 02/02/2016 13:25   Dg Knee Complete 4 Views Right  02/02/2016  CLINICAL DATA:  Tripped and fell on concrete this morning, EXAM: RIGHT KNEE - COMPLETE 4+ VIEW COMPARISON:  None. FINDINGS: Four views of the right knee submitted. There is right knee prosthesis with anatomic alignment. Mild displaced minimal angulated comminuted fracture in distal right femoral metaphysis above the level of prosthesis. IMPRESSION: There is right knee prosthesis with anatomic alignment. Mild displaced minimal angulated comminuted fracture in distal right femoral metaphysis above the level of prosthesis. Electronically Signed   By: Natasha Mead M.D.   On: 02/02/2016 12:22      No results found for: HGBA1C Lab Results  Component Value Date   CREATININE 0.86 02/03/2016       Scheduled Meds: . atenolol  25 mg Oral Daily  .  ceFAZolin (ANCEF) IV  2 g Intravenous On Call to OR  . chlorhexidine  60 mL Topical Once  . docusate sodium  100 mg Oral BID  . estrogens (conjugated)  1.25 mg Oral Daily  . ferrous sulfate  325 mg Oral TID PC  . gabapentin  300 mg Oral TID  . hydrALAZINE  10 mg Oral Q8H  . irbesartan  300 mg Oral Daily  . LORazepam  0.5 mg Oral Q8H  . montelukast  10 mg Oral QHS  . pantoprazole  40 mg Oral Daily  . povidone-iodine  2 application Topical Once  . pravastatin  40 mg Oral q1800  . vitamin C  500 mg Oral Daily   Continuous Infusions: . sodium chloride 75 mL/hr at 02/03/16 0338  . dextrose 5  % and 0.45% NaCl       LOS: 1 day    Time spent: >30 MINS    [facility]  [facility] Pager [phone]. If 7PM-7AM, please contact night-coverage at [email or web address], password [facility] 02/03/2016, 9:15 AM  LOS: 1 day

## 2016-02-03 NOTE — Anesthesia Procedure Notes (Signed)
Procedure Name: Intubation Date/Time: 02/03/2016 2:01 PM Performed by: Carmela RimaMARTINELLI, Linsi Humann F Pre-anesthesia Checklist: Patient being monitored, Suction available, Emergency Drugs available, Timeout performed and Patient identified Patient Re-evaluated:Patient Re-evaluated prior to inductionOxygen Delivery Method: Circle system utilized Preoxygenation: Pre-oxygenation with 100% oxygen Intubation Type: IV induction Ventilation: Mask ventilation without difficulty Laryngoscope Size: Mac and 3 Grade View: Grade I Tube type: Oral Tube size: 7.0 mm Number of attempts: 1 Placement Confirmation: positive ETCO2,  ETT inserted through vocal cords under direct vision and breath sounds checked- equal and bilateral Secured at: 22 cm Tube secured with: Tape Dental Injury: Teeth and Oropharynx as per pre-operative assessment

## 2016-02-03 NOTE — Anesthesia Preprocedure Evaluation (Addendum)
Anesthesia Evaluation  Patient identified by MRN, date of birth, ID band Patient awake    Reviewed: Allergy & Precautions, NPO status , Patient's Chart, lab work & pertinent test results, reviewed documented beta blocker date and time   Airway Mallampati: I  TM Distance: >3 FB Neck ROM: Full    Dental  (+) Partial Lower, Partial Upper, Dental Advidsory Given   Pulmonary former smoker,    breath sounds clear to auscultation       Cardiovascular hypertension, Pt. on home beta blockers and Pt. on medications  Rhythm:Regular Rate:Normal     Neuro/Psych PSYCHIATRIC DISORDERS Anxiety CVA    GI/Hepatic Neg liver ROS, GERD  Medicated,  Endo/Other    Renal/GU negative Renal ROS  negative genitourinary   Musculoskeletal  (+) Arthritis ,   Abdominal Normal abdominal exam  (+)   Peds negative pediatric ROS (+)  Hematology negative hematology ROS (+)   Anesthesia Other Findings   Reproductive/Obstetrics negative OB ROS                           Anesthesia Physical Anesthesia Plan  ASA: II  Anesthesia Plan: General   Post-op Pain Management:    Induction: Intravenous  Airway Management Planned: Oral ETT  Additional Equipment:   Intra-op Plan:   Post-operative Plan: Extubation in OR  Informed Consent: I have reviewed the patients History and Physical, chart, labs and discussed the procedure including the risks, benefits and alternatives for the proposed anesthesia with the patient or authorized representative who has indicated his/her understanding and acceptance.   Dental advisory given and Dental Advisory Given  Plan Discussed with: CRNA and Anesthesiologist  Anesthesia Plan Comments:        Anesthesia Quick Evaluation

## 2016-02-03 NOTE — H&P (View-Only) (Signed)
Reason for Consult: Right periprosthetic distal femur fracture Referring Physician: Emergency room physician  Doris Pacheco is an 75 y.o. female.  HPI: Patient was outside today slipped and fell landing on her right knee on concrete and sustained a right distal femur periprosthetic fracture just above a DePuy Sigma RP total knee arthroplasty. She has a superficial abrasion in the skin. Her index total knee was done 2008 by Dr. Para March, there was loosening of the patellar component in 2014 with fragmentation of the patella. I was consult it and performed a patellectomy me irrigation and debridement with polyethylene exchange that did well until the mishap today. Patient denies any loss of consciousness or any other injuries. Her x-rays show an angulated near 100% apposed distal femur fracture just above the superior flange of the femoral component. There is no evidence of loosening of the components. The superficial abrasion is about 1 cm in size and there is no active bleeding. Comorbidities include hypertension history of stroke with good recovery.  Past Medical History  Diagnosis Date  . Hypertension     takes meds daily  . Anxiety   . History of seasonal allergies   . Stroke ([place])     tia  . GERD (gastroesophageal reflux disease)   . Arthritis     Past Surgical History  Procedure Laterality Date  . Joint replacement      left hip, right knee  . Rotator cuff repair      bilateral  . Tonsillectomy    . Abdominal hysterectomy    . Intraocular lens insertion      right eye  . Patellectomy Right 10/26/2012    Procedure: PATELLECTOMY;  Surgeon: Kerin Salen, MD;  Location: [place];  Service: Orthopedics;  Laterality: Right;  . I&d knee with poly exchange Right 10/26/2012    Procedure: IRRIGATION AND DEBRIDEMENT KNEE WITH POLY EXCHANGE;  Surgeon: Kerin Salen, MD;  Location: [place];  Service: Orthopedics;  Laterality: Right;  poly exchange    Family History  Problem Relation Age of Onset  .  Cancer Mother     COLON  . Diabetes Sister   . Stroke Sister   . Hypertension Father   . Pneumonia Father     Social History:  reports that she has quit smoking. She has never used smokeless tobacco. She reports that she does not drink alcohol or use illicit drugs.  Allergies:  Allergies  Allergen Reactions  . Forteo [Teriparatide (Recombinant)] Shortness Of Breath  . Tape Rash    Paper tape OK    Medications: I have reviewed the patient's current medications.  Results for orders placed or performed during the hospital encounter of 02/02/16 (from the past 48 hour(s))  CBC with Differential     Status: Abnormal   Collection Time: 02/02/16  1:00 PM  Result Value Ref Range   WBC 6.5 4.0 - 10.5 K/uL   RBC 4.38 3.87 - 5.11 MIL/uL   Hemoglobin 11.8 (L) 12.0 - 15.0 g/dL   HCT 36.3 36.0 - 46.0 %   MCV 82.9 78.0 - 100.0 fL   MCH 26.9 26.0 - 34.0 pg   MCHC 32.5 30.0 - 36.0 g/dL   RDW 15.0 11.5 - 15.5 %   Platelets 231 150 - 400 K/uL   Neutrophils Relative % 81 %   Neutro Abs 5.3 1.7 - 7.7 K/uL   Lymphocytes Relative 13 %   Lymphs Abs 0.9 0.7 - 4.0 K/uL   Monocytes Relative 6 %  Monocytes Absolute 0.4 0.1 - 1.0 K/uL   Eosinophils Relative 0 %   Eosinophils Absolute 0.0 0.0 - 0.7 K/uL   Basophils Relative 0 %   Basophils Absolute 0.0 0.0 - 0.1 K/uL  Basic metabolic panel     Status: Abnormal   Collection Time: 02/02/16  1:00 PM  Result Value Ref Range   Sodium 136 135 - 145 mmol/L   Potassium 3.4 (L) 3.5 - 5.1 mmol/L   Chloride 102 101 - 111 mmol/L   CO2 23 22 - 32 mmol/L   Glucose, Bld 103 (H) 65 - 99 mg/dL   BUN 18 6 - 20 mg/dL   Creatinine, Ser 1.79 0.44 - 1.00 mg/dL   Calcium 9.0 8.9 - 58.8 mg/dL   GFR calc non Af Amer >60 >60 mL/min   GFR calc Af Amer >60 >60 mL/min    Comment: (NOTE) The eGFR has been calculated using the CKD EPI equation. This calculation has not been validated in all clinical situations. eGFR's persistently <60 mL/min signify possible Chronic  Kidney Disease.    Anion gap 11 5 - 15  Calcium     Status: None   Collection Time: 02/02/16  2:48 PM  Result Value Ref Range   Calcium 8.9 8.9 - 10.3 mg/dL  Albumin     Status: None   Collection Time: 02/02/16  2:48 PM  Result Value Ref Range   Albumin 3.6 3.5 - 5.0 g/dL  APTT     Status: None   Collection Time: 02/02/16  2:48 PM  Result Value Ref Range   aPTT 29 24 - 37 seconds  Protime-INR     Status: None   Collection Time: 02/02/16  2:48 PM  Result Value Ref Range   Prothrombin Time 13.8 11.6 - 15.2 seconds   INR 1.04 0.00 - 1.49  Type and screen  [facility]     Status: None   Collection Time: 02/02/16  2:50 PM  Result Value Ref Range   ABO/RH(D) O POS    Antibody Screen NEG    Sample Expiration 02/05/2016     Dg Chest 2 View  02/02/2016  CLINICAL DATA:  Preop evaluation for broken right femur EXAM: CHEST  2 VIEW COMPARISON:  07/25/2015 FINDINGS: The heart size and mediastinal contours are within normal limits. Both lungs are clear. The visualized skeletal structures shows stable degenerative changes of the thoracic spine with increase kyphosis. IMPRESSION: No active cardiopulmonary disease. Electronically Signed   By: Alcide Clever M.D.   On: 02/02/2016 13:25   Dg Knee Complete 4 Views Right  02/02/2016  CLINICAL DATA:  Tripped and fell on concrete this morning, EXAM: RIGHT KNEE - COMPLETE 4+ VIEW COMPARISON:  None. FINDINGS: Four views of the right knee submitted. There is right knee prosthesis with anatomic alignment. Mild displaced minimal angulated comminuted fracture in distal right femoral metaphysis above the level of prosthesis. IMPRESSION: There is right knee prosthesis with anatomic alignment. Mild displaced minimal angulated comminuted fracture in distal right femoral metaphysis above the level of prosthesis. Electronically Signed   By: Natasha Mead M.D.   On: 02/02/2016 12:22    ROS: patient denies any chest pain shortness of breath loss of  consciousness nausea or vomiting. Blood pressure 169/77, pulse 79, temperature 98.1 F (36.7 C), temperature source Oral, resp. rate 20, SpO2 100 %. Physical Exam: Patient is in a posterior splint which is taken down there is a 1 cm superficial abrasion of the anterior aspect of  the knee there is no active bleeding. He is neurovascularly intact distally. Wiggles her toes without difficulty normal sensation to her feet.  Assessment/Plan: Assessment: Right distal femur periprosthetic fracture just above a DePuy Sigma RP total knee with a standard box.  Plan: We will get her set up for open reduction internal fixation using a Phoenix retrograde nail to control motion, and allow compression through the fracture site. Risks and benefits of surgery were discussed with the patient and family in the room.  Kerin Salen 02/02/2016, 5:51 PM

## 2016-02-03 NOTE — Progress Notes (Signed)
Arrived from PACU alert, dressing dry and intact, vital are good resting comforablilty

## 2016-02-03 NOTE — Progress Notes (Signed)
Tele applied and called, removed for surgery

## 2016-02-03 NOTE — Anesthesia Postprocedure Evaluation (Signed)
Anesthesia Post Note  Patient: Tiajuana Amassnita L Keegan  Procedure(s) Performed: Procedure(s) (LRB): OPEN REDUCTION INTERNAL FIXATION (ORIF) DISTAL FEMUR FRACTURE (Right)  Patient location during evaluation: PACU Anesthesia Type: General Level of consciousness: awake and alert Pain management: pain level controlled Vital Signs Assessment: post-procedure vital signs reviewed and stable Respiratory status: spontaneous breathing, nonlabored ventilation, respiratory function stable and patient connected to nasal cannula oxygen Cardiovascular status: blood pressure returned to baseline and stable Postop Assessment: no signs of nausea or vomiting Anesthetic complications: no Comments: Administering more pain meds.     Last Vitals:  Filed Vitals:   02/03/16 1630 02/03/16 1645  BP: 128/51 125/58  Pulse: 64 59  Temp:    Resp: 16 11    Last Pain:  Filed Vitals:   02/03/16 1654  PainSc: 8                  Shelton SilvasKevin D Demarco Bacci

## 2016-02-03 NOTE — Transfer of Care (Signed)
Immediate Anesthesia Transfer of Care Note  Patient: Doris Pacheco  Procedure(s) Performed: Procedure(s): OPEN REDUCTION INTERNAL FIXATION (ORIF) DISTAL FEMUR FRACTURE (Right)  Patient Location: PACU  Anesthesia Type:General  Level of Consciousness: awake, oriented and patient cooperative  Airway & Oxygen Therapy: Patient Spontanous Breathing and Patient connected to nasal cannula oxygen  Post-op Assessment: Report given to RN, Post -op Vital signs reviewed and stable and Patient moving all extremities  Post vital signs: Reviewed and stable  Last Vitals:  Filed Vitals:   02/03/16 0512 02/03/16 1131  BP: 132/57 130/53  Pulse: 70 66  Temp: 37 C 36.8 C  Resp: 18 19    Last Pain:  Filed Vitals:   02/03/16 1133  PainSc: 2       Patients Stated Pain Goal: 3 (02/03/16 0701)  Complications: No apparent anesthesia complications

## 2016-02-03 NOTE — Progress Notes (Signed)
Orthopedic Tech Progress Note Patient Details:  Tiajuana Amassnita L Bosher November 15, 1940 [id number]  Ortho Devices Type of Ortho Device: Ace wrap, Long leg splint Ortho Device/Splint Location: applied ohf to bed Ortho Device/Splint Interventions: Ordered, Application   Jennye MoccasinHughes, Orva Gwaltney Craig 02/03/2016, 6:43 PM

## 2016-02-03 NOTE — Progress Notes (Signed)
Orthopedic Tech Progress Note Patient Details:  Doris Pacheco 1941-02-02 [id number]  Ortho Devices Type of Ortho Device: Ace wrap, Long leg splint Ortho Device/Splint Location: applied ohf to bed Ortho Device/Splint Interventions: Ordered, Application   Doris Pacheco, Doris Pacheco 02/03/2016, 6:45 PM

## 2016-02-04 ENCOUNTER — Encounter (HOSPITAL_COMMUNITY): Payer: Self-pay | Admitting: Orthopedic Surgery

## 2016-02-04 DIAGNOSIS — D509 Iron deficiency anemia, unspecified: Secondary | ICD-10-CM

## 2016-02-04 LAB — COMPREHENSIVE METABOLIC PANEL
ALBUMIN: 2.5 g/dL — AB (ref 3.5–5.0)
ALK PHOS: 24 U/L — AB (ref 38–126)
ALT: 9 U/L — ABNORMAL LOW (ref 14–54)
ANION GAP: 11 (ref 5–15)
AST: 14 U/L — ABNORMAL LOW (ref 15–41)
BUN: 13 mg/dL (ref 6–20)
CALCIUM: 7.6 mg/dL — AB (ref 8.9–10.3)
CHLORIDE: 105 mmol/L (ref 101–111)
CO2: 21 mmol/L — AB (ref 22–32)
Creatinine, Ser: 1.18 mg/dL — ABNORMAL HIGH (ref 0.44–1.00)
GFR calc non Af Amer: 44 mL/min — ABNORMAL LOW (ref >60)
GFR, EST AFRICAN AMERICAN: 51 mL/min — AB (ref >60)
GLUCOSE: 120 mg/dL — AB (ref 65–99)
Potassium: 3.4 mmol/L — ABNORMAL LOW (ref 3.5–5.1)
SODIUM: 137 mmol/L (ref 135–145)
Total Bilirubin: 0.5 mg/dL (ref 0.3–1.2)
Total Protein: 5.2 g/dL — ABNORMAL LOW (ref 6.5–8.1)

## 2016-02-04 LAB — MAGNESIUM: MAGNESIUM: 1.9 mg/dL (ref 1.7–2.4)

## 2016-02-04 LAB — CBC
HCT: 28.1 % — ABNORMAL LOW (ref 36.0–46.0)
HEMOGLOBIN: 9.1 g/dL — AB (ref 12.0–15.0)
MCH: 27.3 pg (ref 26.0–34.0)
MCHC: 32.4 g/dL (ref 30.0–36.0)
MCV: 84.4 fL (ref 78.0–100.0)
PLATELETS: 165 10*3/uL (ref 150–400)
RBC: 3.33 MIL/uL — AB (ref 3.87–5.11)
RDW: 15.3 % (ref 11.5–15.5)
WBC: 6.6 10*3/uL (ref 4.0–10.5)

## 2016-02-04 MED ORDER — POTASSIUM CHLORIDE IN NACL 40-0.9 MEQ/L-% IV SOLN
INTRAVENOUS | Status: DC
  Administered 2016-02-04: 100 mL/h via INTRAVENOUS
  Filled 2016-02-04 (×6): qty 1000

## 2016-02-04 NOTE — Evaluation (Signed)
Physical Therapy Evaluation Patient Details Name: Doris Pacheco MRN: [id number] DOB: [birth year] Today's Date: 02/04/2016   History of Present Illness  pt presents after a fall at home sustaining a R Distal femur fx just superior to R TKR.  pt now s/p ORIF.  pt with hx of R TKR, L THR, Bil Rotator Cuff Repairs, Anxiety, HTN, TIA, and R knee I+D with Patellectomy.    Clinical Impression  Pt very motivated to improve mobility, however clearly not following weightbearing restriction of 40lbs despite max cueing to do so.  Ambulation limited as pt simply walking on her R LE despite repeated cues about use of RW to decrease weightbearing and attempting TDWBing, but still unable to unweight her R LE.  Pt would benefit from continued rehab at [facility] and pt is in agreement.  Will continue to follow while on acute.      Follow Up Recommendations SNF    Equipment Recommendations  None recommended by PT    Recommendations for Other Services       Precautions / Restrictions Precautions Precautions: Fall Required Braces or Orthoses: Knee Immobilizer - Right Knee Immobilizer - Right: Other (comment) (Order states to wear prn, but not in bed.) Restrictions Weight Bearing Restrictions: Yes RLE Weight Bearing: Partial weight bearing RLE Partial Weight Bearing Percentage or Pounds: 20 (or 40lbs)      Mobility  Bed Mobility Overal bed mobility: Needs Assistance Bed Mobility: Supine to Sit     Supine to sit: Min assist;HOB elevated     General bed mobility comments: pt does try to do as much as she can for herself.  A with R LE only.    Transfers Overall transfer level: Needs assistance Equipment used: Rolling walker (2 wheeled) Transfers: Sit to/from Stand Sit to Stand: Min assist;+2 physical assistance         General transfer comment: pt clearly not able to maintain 40lb weightbearing restriction and uses R LE when coming to standing.  Max cueing for only doing TDWBing and pt still unable  to unweight R LE.    Ambulation/Gait Ambulation/Gait assistance: Min assist;+2 physical assistance Ambulation Distance (Feet): 5 Feet Assistive device: Rolling walker (2 wheeled) Gait Pattern/deviations: Step-through pattern;Decreased stride length     General Gait Details: Only allowed pt to ambulate 5' as she is clearly weaghtbearing on her R LE despite max cueing for decreasing weight.  pt even standing and taking her hands off of RW despite cues to use RW to decrease weightbearing on R LE.    Stairs            Wheelchair Mobility    Modified Rankin (Stroke Patients Only)       Balance Overall balance assessment: Needs assistance Sitting-balance support: Bilateral upper extremity supported;Feet supported Sitting balance-Leahy Scale: Fair     Standing balance support: Bilateral upper extremity supported;During functional activity Standing balance-Leahy Scale: Poor                               Pertinent Vitals/Pain Pain Assessment: 0-10 Pain Score: 5  Pain Location: R LE Pain Descriptors / Indicators: Aching;Grimacing Pain Intervention(s): Monitored during session;Premedicated before session;Repositioned    Home Living Family/patient expects to be discharged to:: Skilled nursing facility                      Prior Function Level of Independence: Independent with assistive device(s)  Hand Dominance        Extremity/Trunk Assessment   Upper Extremity Assessment: Defer to OT evaluation           Lower Extremity Assessment: Generalized weakness;RLE deficits/detail RLE Deficits / Details: ROM and Strength limited by post-op pain       Communication   Communication: No difficulties  Cognition Arousal/Alertness: Awake/alert Behavior During Therapy: WFL for tasks assessed/performed Overall Cognitive Status: Within Functional Limits for tasks assessed                      General Comments       Exercises Total Joint Exercises Ankle Circles/Pumps: AROM;Both;10 reps Quad Sets: AROM;Both;10 reps      Assessment/Plan    PT Assessment Patient needs continued PT services  PT Diagnosis Difficulty walking   PT Problem List Decreased strength;Decreased activity tolerance;Decreased balance;Decreased mobility;Decreased coordination;Decreased knowledge of use of DME;Decreased knowledge of precautions;Pain  PT Treatment Interventions DME instruction;Gait training;Stair training;Functional mobility training;Therapeutic activities;Therapeutic exercise;Balance training;Patient/family education   PT Goals (Current goals can be found in the Care Plan section) Acute Rehab PT Goals Patient Stated Goal: Go to rehab to walk again PT Goal Formulation: With patient Time For Goal Achievement: 02/11/16 Potential to Achieve Goals: Good    Frequency Min 5X/week   Barriers to discharge Decreased caregiver support      Co-evaluation               End of Session Equipment Utilized During Treatment: Gait belt Activity Tolerance: Patient tolerated treatment well Patient left: in chair;with call bell/phone within reach;with chair alarm set Nurse Communication: Mobility status         Time: [id number] PT Time Calculation (min) (ACUTE ONLY): 25 min   Charges:   PT Evaluation $PT Eval Moderate Complexity: 1 Procedure PT Treatments $Gait Training: 8-22 mins   PT G CodesSunny Pacheco:        Doris Pacheco F, [place]PT [phone] 02/04/2016, 10:29 AM

## 2016-02-04 NOTE — Progress Notes (Signed)
Patient ID: Doris Pacheco, female   DOB: October 02, 1940, 75 y.o.   MRN: [id number] PATIENT ID: Doris Amassnita L Natt  MRN: [id number]  DOB/AGE:  October 02, 1940 / 75 y.o.  1 Day Post-Op Procedure(s) (LRB): OPEN REDUCTION INTERNAL FIXATION (ORIF) DISTAL FEMUR FRACTURE (Right)    PROGRESS NOTE Subjective: Patient is alert, oriented, no Nausea, nno Vomiting, yes passing gas. Taking PO well. Denies SOB, Chest or Calf Pain. Using Incentive Spirometer, PAS in place. Ambulate PWB 40 lbs RLE/. Patient reports pain as 3/10 .    Objective: Vital signs in last 24 hours: Filed Vitals:   02/03/16 1800 02/03/16 1941 02/04/16 0006 02/04/16 0500  BP: 138/51 122/53 93/43 125/45  Pulse: 64 63 75 62  Temp: 98.6 F (37 C) 98.2 F (36.8 C) 98.1 F (36.7 C) 98.1 F (36.7 C)  TempSrc: Oral Oral Oral Oral  Resp:      SpO2: 98% 100% 96% 98%      Intake/Output from previous day: I/O last 3 completed shifts: In: 750 [I.V.:750] Out: 480 [Urine:450; Blood:30]   Intake/Output this shift:     LABORATORY DATA:  Recent Labs  02/02/16 1300 02/02/16 1448 02/03/16 0544  WBC 6.5  --  7.0  HGB 11.8*  --  10.2*  HCT 36.3  --  31.0*  PLT 231  --  187  NA 136  --  139  K 3.4*  --  4.1  CL 102  --  105  CO2 23  --  23  BUN 18  --  16  CREATININE 0.79  --  0.86  GLUCOSE 103*  --  99  INR  --  1.04  --   CALCIUM 9.0 8.9 8.4*    Examination: Neurologically intact ABD soft Neurovascular intact Sensation intact distally Intact pulses distally Dorsiflexion/Plantar flexion intact Incision: scant drainage No cellulitis present Compartment soft}  Assessment:   1 Day Post-Op Procedure(s) (LRB): OPEN REDUCTION INTERNAL FIXATION (ORIF) DISTAL FEMUR FRACTURE (Right) ADDITIONAL DIAGNOSIS: Expected Acute Blood Loss Anemia, Hypertension  Plan: PT/OT PWB RLE, 40lbs,  DVT Prophylaxis:  SCDx72hrs, ASA 325 mg BID x 2 weeks DISCHARGE PLAN: Skilled Nursing Facility/Rehab DISCHARGE NEEDS: HHPT, Walker and 3-in-1 comode  seat     Korrina Zern J 02/04/2016, 6:39 AM

## 2016-02-04 NOTE — [place] FL2 (Signed)
[place] MEDICAID FL2 LEVEL OF CARE SCREENING TOOL     IDENTIFICATION  Patient Name: Doris Pacheco Birthdate: Jan 13, 1941 Sex: female Admission Date (Current Location): 02/02/2016  [facility] and [place] Number:  Producer, television/film/video and Address:  [facility] [place], [address], [place], [place] [address]      Provider Number: [id number]  Attending Physician Name and Address:  Richarda Overlie, MD  Relative Name and Phone Number:       Current Level of Care: Hospital Recommended Level of Care: Skilled Nursing Facility Prior Approval Number:    Date Approved/Denied:   PASRR Number: [id number]  Discharge Plan: SNF    Current Diagnoses: Patient Active Problem List   Diagnosis Date Noted  . Closed fracture of right distal femur (HCC) 02/03/2016  . Abrasion, knee   . Fracture of femur, distal, right, closed (HCC) 02/02/2016  . 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 Anemia 02/02/2016  . Femoral distal fracture, right, closed, initial encounter 02/02/2016  . Essential hypertension   . Fall   . Osteoporosis 02/18/2015  . Vertebral fracture, osteoporotic (HCC) 02/18/2015  . Bilateral low back pain without sciatica 02/18/2015  . Midline low back pain without sciatica 01/08/2015  . Chest pain 07/30/2013  . Hypertension   . Anxiety   . History of seasonal allergies   . History of CVA (cerebrovascular accident)   . GERD (gastroesophageal reflux disease)   . Arthritis   . 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    Orientation RESPIRATION BLADDER Height & Weight     Self, Time, Situation, Place  Normal Continent Weight:   Height:     BEHAVIORAL SYMPTOMS/MOOD NEUROLOGICAL BOWEL NUTRITION STATUS      Continent Diet (Please see discharge summary.)  AMBULATORY STATUS COMMUNICATION OF NEEDS Skin   Limited Assist Verbally Surgical wounds                       Personal Care Assistance Level of Assistance  Bathing, Feeding,  Dressing Bathing Assistance: Limited assistance Feeding assistance: Limited assistance Dressing Assistance: Limited assistance     Functional Limitations Info             SPECIAL CARE FACTORS FREQUENCY  PT (By licensed PT), OT (By licensed OT)     PT Frequency: 5 OT Frequency: 5            Contractures      Additional Factors Info  Code Status, Allergies Code Status Info: DNR Allergies Info: Forteo, Tape           Current Medications (02/04/2016):  This is the current hospital active medication list Current Facility-Administered Medications  Medication Dose Route Frequency Provider Last Rate Last Dose  . 0.9 % NaCl with KCl 40 mEq / L  infusion   Intravenous Continuous Richarda Overlie, MD      . acetaminophen (TYLENOL) tablet 650 mg  650 mg Oral Q6H PRN Allena Katz, PA-C       Or  . acetaminophen (TYLENOL) suppository 650 mg  650 mg Rectal Q6H PRN Allena Katz, PA-C      . aspirin EC tablet 325 mg  325 mg Oral BID WC Allena Katz, PA-C   325 mg at 02/04/16 0815  . atenolol (TENORMIN) tablet 25 mg  25 mg Oral Daily Russella Dar, NP   25 mg at 02/02/16 2131  . bisacodyl (DULCOLAX) suppository 10 mg  10 mg  Rectal Daily PRN Russella DarAllison L Ellis, NP      . docusate sodium (COLACE) capsule 100 mg  100 mg Oral BID Russella DarAllison L Ellis, NP   100 mg at 02/04/16 1016  . ferrous sulfate tablet 325 mg  325 mg Oral TID PC Russella DarAllison L Ellis, NP   325 mg at 02/04/16 1300  . gabapentin (NEURONTIN) capsule 300 mg  300 mg Oral TID Russella DarAllison L Ellis, NP   300 mg at 02/04/16 1000  . hydrALAZINE (APRESOLINE) injection 5 mg  5 mg Intravenous Q4H PRN Richarda OverlieNayana Abrol, MD      . hydrALAZINE (APRESOLINE) tablet 10 mg  10 mg Oral Q6H Richarda OverlieNayana Abrol, MD   10 mg at 02/04/16 0619  . HYDROcodone-acetaminophen (NORCO/VICODIN) 5-325 MG per tablet 1-2 tablet  1-2 tablet Oral Q6H PRN Russella DarAllison L Ellis, NP   2 tablet at 02/04/16 1140  . HYDROmorphone (DILAUDID) injection 0.5 mg  0.5 mg Intravenous Once Leda GauzeKaren J  Kirby-Graham, NP   0.5 mg at 02/04/16 [id number]  . lactated ringers infusion   Intravenous Continuous Gean BirchwoodFrank Rowan, MD      . LORazepam (ATIVAN) tablet 0.5 mg  0.5 mg Oral Q8H Russella DarAllison L Ellis, NP   0.5 mg at 02/04/16 [id number]  . menthol-cetylpyridinium (CEPACOL) lozenge 3 mg  1 lozenge Oral PRN Allena KatzEric K Phillips, PA-C       Or  . phenol (CHLORASEPTIC) mouth spray 1 spray  1 spray Mouth/Throat PRN Allena KatzEric K Phillips, PA-C      . methocarbamol (ROBAXIN) tablet 500 mg  500 mg Oral Q6H PRN Russella DarAllison L Ellis, NP   500 mg at 02/04/16 1141   Or  . methocarbamol (ROBAXIN) 500 mg in dextrose 5 % 50 mL IVPB  500 mg Intravenous Q6H PRN Russella DarAllison L Ellis, NP      . metoCLOPramide (REGLAN) tablet 5-10 mg  5-10 mg Oral Q8H PRN Allena KatzEric K Phillips, PA-C       Or  . metoCLOPramide (REGLAN) injection 5-10 mg  5-10 mg Intravenous Q8H PRN Allena KatzEric K Phillips, PA-C      . montelukast (SINGULAIR) tablet 10 mg  10 mg Oral QHS Russella DarAllison L Ellis, NP   10 mg at 02/03/16 2136  . morphine 2 MG/ML injection 0.5 mg  0.5 mg Intravenous Q2H PRN Russella DarAllison L Ellis, NP      . morphine 2 MG/ML injection 1-4 mg  1-4 mg Intravenous Q2H PRN Russella DarAllison L Ellis, NP   2 mg at 02/03/16 1844  . ondansetron (ZOFRAN) injection 4 mg  4 mg Intravenous Q6H PRN Russella DarAllison L Ellis, NP      . pantoprazole (PROTONIX) EC tablet 40 mg  40 mg Oral Daily Russella DarAllison L Ellis, NP   40 mg at 02/04/16 1016  . polyethylene glycol (MIRALAX / GLYCOLAX) packet 17 g  17 g Oral Daily PRN Russella DarAllison L Ellis, NP      . pravastatin (PRAVACHOL) tablet 40 mg  40 mg Oral q1800 Russella DarAllison L Ellis, NP   40 mg at 02/02/16 2130  . vitamin C (ASCORBIC ACID) tablet 500 mg  500 mg Oral Daily Russella DarAllison L Ellis, NP   500 mg at 02/04/16 1016     Discharge Medications: Please see discharge summary for a list of discharge medications.  Relevant Imaging Results:  Relevant Lab Results:   Additional Information SSN: [id number]  Rod MaeVaughn, Cyree Chuong S, LCSW

## 2016-02-04 NOTE — Progress Notes (Signed)
[facility] PROGRESS NOTE  Doris Pacheco AOZ:[id number] DOB: [birth year] DOA: 02/02/2016   PCP: Ralene OkMOREIRA,ROY, MD     Assessment/Plan: Principal Problem:   Fracture of femur, distal, right, closed (HCC) Active Problems:   Loose total knee arthroplasty - right    Hypertension   Anxiety   History of CVA (cerebrovascular accident)   GERD (gastroesophageal reflux disease)   Arthritis   Osteoporosis   Left ventricular diastolic dysfunction   Anemia   Femoral distal fracture, right, closed, initial encounter   Abrasion, knee   Closed fracture of right distal femur (HCC)   75 y.o. female with medical history significant for hypertension, HTN with history of moderate concentric hypertrophy and associated left ventricular diastolic dysfunction, anxiety disorder, GERD, prior CVA, osteoarthritis, severe osteoporosis, and history of right TKA in 2010. Patient reports a mechanical fall today and landed on right leg and developed significant pain and therefore presented to the ER. She was not having any palpitations, dizziness chest pain or shortness of breath prior to onset of fall.  Assessment and plan Fracture of femur, distal, right, closed / Osteoporosis -Patient with mechanical fall and subsequent distal femur fracture -[employer] to apply splint Patient seen by Dr.Rowan, status post open reduction internal fixation -IV morphine for pain Vitamin D levels okay -Continue preadmission calcium with vitamin D and Forteo PT OT recommended SNF Per orthopedics aspirin 325 mg twice a day 2 weeks for DVT prophylaxis -Patient on estrogen based hormone replacement prior to admission, this will be held until patient is ambulatory, therefore estrogen is being discontinued -Preoperative EKG and chest x-ray unremarkable   Acute kidney injury, creatinine has increased from 0.86-1.18, blood pressure has been soft, therefore discontinued Diovan and started IV fluids   Loose total knee  arthroplasty - right /Arthritis -Patient with recent issues of tightness and pain above-knee and has recently been started on Voltaren gel and gabapentin -Also was on Vicodin and meloxicam prior to admission.    Hypertension/Left ventricular diastolic dysfunction -Prior to admission was taking Maxide which has been held, blood pressure soft therefore hold Avapro/Diovan Continue atenolol and prn  hydralazine   History of CVA (cerebrovascular accident) -Continue Livalo and lovastatin   Anxiety -Recently started on Ativan tabs   GERD (gastroesophageal reflux disease) -Therapeutic substitution for Nexium   Anemia -Hgb has trended down slowly 11.8>9.1 Continue to follow    DVT prophylaxsis SCDs  Code Status:  DO NOT RESUSCITATE     Family Communication: Discussed in detail with the patient, all imaging results, lab results explained to the patient   Disposition Plan:  Anticipate discharge to SNF tomorrow     Consultants:  Orthopedics    Procedures:     Antibiotics: Anti-infectives    Start     Dose/Rate Route Frequency Ordered Stop   02/03/16 0600  ceFAZolin (ANCEF) IVPB 2g/100 mL premix     2 g 200 mL/hr over 30 Minutes Intravenous On call to O.R. 02/02/16 1803 02/03/16 1433         HPI/Subjective: Denies any chest pain or shortness of breath, afebrile overnight  Objective: Filed Vitals:   02/03/16 1800 02/03/16 1941 02/04/16 0006 02/04/16 0500  BP: 138/51 122/53 93/43 125/45  Pulse: 64 63 75 62  Temp: 98.6 F (37 C) 98.2 F (36.8 C) 98.1 F (36.7 C) 98.1 F (36.7 C)  TempSrc: Oral Oral Oral Oral  Resp:      SpO2: 98% 100% 96% 98%    Intake/Output  Summary (Last 24 hours) at 02/04/16 1142 Last data filed at 02/03/16 1852  Gross per 24 hour  Intake    750 ml  Output    280 ml  Net    470 ml    Exam:  Examination:  General exam: Appears calm and comfortable  Respiratory system: Clear to auscultation. Respiratory effort  normal. Cardiovascular system: S1 & S2 heard, RRR. No JVD, murmurs, rubs, gallops or clicks. No pedal edema. Gastrointestinal system: Abdomen is nondistended, soft and nontender. No organomegaly or masses felt. Normal bowel sounds heard. Central nervous system: Alert and oriented. No focal neurological deficits. Extremities: Symmetric 5 x 5 power. Skin: No rashes, lesions or ulcers Psychiatry: Judgement and insight appear normal. Mood & affect appropriate.     Data Reviewed: I have personally reviewed following labs and imaging studies  Micro Results Recent Results (from the past 240 hour(s))  Surgical pcr screen     Status: None   Collection Time: 02/03/16  3:47 AM  Result Value Ref Range Status   MRSA, PCR NEGATIVE NEGATIVE Final   Staphylococcus aureus NEGATIVE NEGATIVE Final    Comment:        The Xpert SA Assay (FDA approved for NASAL specimens in patients over 90 years of age), is one component of a comprehensive surveillance program.  Test performance has been validated by [facility] for patients greater than or equal to 56 year old. It is not intended to diagnose infection nor to guide or monitor treatment.     Radiology Reports Dg Chest 2 View  02/02/2016  CLINICAL DATA:  Preop evaluation for broken right femur EXAM: CHEST  2 VIEW COMPARISON:  07/25/2015 FINDINGS: The heart size and mediastinal contours are within normal limits. Both lungs are clear. The visualized skeletal structures shows stable degenerative changes of the thoracic spine with increase kyphosis. IMPRESSION: No active cardiopulmonary disease. Electronically Signed   By: Alcide Clever M.D.   On: 02/02/2016 13:25   Dg Knee 1-2 Views Right  02/03/2016  CLINICAL DATA:  Open reduction internal fixation for distal femur fracture EXAM: DG C-ARM 61-120 MIN; RIGHT KNEE - 1-2 VIEW COMPARISON:  Feb 02, 2016 FLUOROSCOPY TIME:  1 minutes 49 seconds; 1 acquired image FINDINGS: Frontal image shows screw and plate  fixation through a fracture of the distal femur with alignment near anatomic on this single frontal view. Total knee replacement is noted with the visualized femoral and tibial prosthetic components well-seated. No dislocation evident. IMPRESSION: Screw and rod fixation through distal femur fracture with alignment near anatomic on frontal view. Total knee replacement prostheses appear well seated on frontal view. Electronically Signed   By: Bretta Bang III M.D.   On: 02/03/2016 16:22   Dg Knee Complete 4 Views Right  02/02/2016  CLINICAL DATA:  Tripped and fell on concrete this morning, EXAM: RIGHT KNEE - COMPLETE 4+ VIEW COMPARISON:  None. FINDINGS: Four views of the right knee submitted. There is right knee prosthesis with anatomic alignment. Mild displaced minimal angulated comminuted fracture in distal right femoral metaphysis above the level of prosthesis. IMPRESSION: There is right knee prosthesis with anatomic alignment. Mild displaced minimal angulated comminuted fracture in distal right femoral metaphysis above the level of prosthesis. Electronically Signed   By: Natasha Mead M.D.   On: 02/02/2016 12:22   Dg C-arm 1-60 Min  02/03/2016  CLINICAL DATA:  Open reduction internal fixation for distal femur fracture EXAM: DG C-ARM 61-120 MIN; RIGHT KNEE - 1-2 VIEW COMPARISON:  Feb 02, 2016 FLUOROSCOPY TIME:  1 minutes 49 seconds; 1 acquired image FINDINGS: Frontal image shows screw and plate fixation through a fracture of the distal femur with alignment near anatomic on this single frontal view. Total knee replacement is noted with the visualized femoral and tibial prosthetic components well-seated. No dislocation evident. IMPRESSION: Screw and rod fixation through distal femur fracture with alignment near anatomic on frontal view. Total knee replacement prostheses appear well seated on frontal view. Electronically Signed   By: Bretta Bang III M.D.   On: 02/03/2016 16:22     CBC  Recent  Labs Lab 02/02/16 1300 02/03/16 0544 02/04/16 0705  WBC 6.5 7.0 6.6  HGB 11.8* 10.2* 9.1*  HCT 36.3 31.0* 28.1*  PLT 231 187 165  MCV 82.9 83.6 84.4  MCH 26.9 27.5 27.3  MCHC 32.5 32.9 32.4  RDW 15.0 15.2 15.3  LYMPHSABS 0.9  --   --   MONOABS 0.4  --   --   EOSABS 0.0  --   --   BASOSABS 0.0  --   --     Chemistries   Recent Labs Lab 02/02/16 1300 02/02/16 1448 02/03/16 0544 02/04/16 0705  NA 136  --  139 137  K 3.4*  --  4.1 3.4*  CL 102  --  105 105  CO2 23  --  23 21*  GLUCOSE 103*  --  99 120*  BUN 18  --  16 13  CREATININE 0.79  --  0.86 1.18*  CALCIUM 9.0 8.9 8.4* 7.6*  AST  --   --   --  14*  ALT  --   --   --  9*  ALKPHOS  --   --   --  24*  BILITOT  --   --   --  0.5   ------------------------------------------------------------------------------------------------------------------ CrCl cannot be calculated (Unknown ideal weight.). ------------------------------------------------------------------------------------------------------------------ No results for input(s): HGBA1C in the last 72 hours. ------------------------------------------------------------------------------------------------------------------ No results for input(s): CHOL, HDL, LDLCALC, TRIG, CHOLHDL, LDLDIRECT in the last 72 hours. ------------------------------------------------------------------------------------------------------------------ No results for input(s): TSH, T4TOTAL, T3FREE, THYROIDAB in the last 72 hours.  Invalid input(s): FREET3 ------------------------------------------------------------------------------------------------------------------ No results for input(s): VITAMINB12, FOLATE, FERRITIN, TIBC, IRON, RETICCTPCT in the last 72 hours.  Coagulation profile  Recent Labs Lab 02/02/16 1448  INR 1.04    No results for input(s): DDIMER in the last 72 hours.  Cardiac Enzymes No results for input(s): CKMB, TROPONINI, MYOGLOBIN in the last 168  hours.  Invalid input(s): CK ------------------------------------------------------------------------------------------------------------------ Invalid input(s): POCBNP   CBG: No results for input(s): GLUCAP in the last 168 hours.     Studies: Dg Chest 2 View  02/02/2016  CLINICAL DATA:  Preop evaluation for broken right femur EXAM: CHEST  2 VIEW COMPARISON:  07/25/2015 FINDINGS: The heart size and mediastinal contours are within normal limits. Both lungs are clear. The visualized skeletal structures shows stable degenerative changes of the thoracic spine with increase kyphosis. IMPRESSION: No active cardiopulmonary disease. Electronically Signed   By: Alcide Clever M.D.   On: 02/02/2016 13:25   Dg Knee 1-2 Views Right  02/03/2016  CLINICAL DATA:  Open reduction internal fixation for distal femur fracture EXAM: DG C-ARM 61-120 MIN; RIGHT KNEE - 1-2 VIEW COMPARISON:  Feb 02, 2016 FLUOROSCOPY TIME:  1 minutes 49 seconds; 1 acquired image FINDINGS: Frontal image shows screw and plate fixation through a fracture of the distal femur with alignment near anatomic on this single frontal view. Total knee replacement is noted  with the visualized femoral and tibial prosthetic components well-seated. No dislocation evident. IMPRESSION: Screw and rod fixation through distal femur fracture with alignment near anatomic on frontal view. Total knee replacement prostheses appear well seated on frontal view. Electronically Signed   By: Bretta Bang III M.D.   On: 02/03/2016 16:22   Dg Knee Complete 4 Views Right  02/02/2016  CLINICAL DATA:  Tripped and fell on concrete this morning, EXAM: RIGHT KNEE - COMPLETE 4+ VIEW COMPARISON:  None. FINDINGS: Four views of the right knee submitted. There is right knee prosthesis with anatomic alignment. Mild displaced minimal angulated comminuted fracture in distal right femoral metaphysis above the level of prosthesis. IMPRESSION: There is right knee prosthesis with  anatomic alignment. Mild displaced minimal angulated comminuted fracture in distal right femoral metaphysis above the level of prosthesis. Electronically Signed   By: Natasha Mead M.D.   On: 02/02/2016 12:22   Dg C-arm 1-60 Min  02/03/2016  CLINICAL DATA:  Open reduction internal fixation for distal femur fracture EXAM: DG C-ARM 61-120 MIN; RIGHT KNEE - 1-2 VIEW COMPARISON:  Feb 02, 2016 FLUOROSCOPY TIME:  1 minutes 49 seconds; 1 acquired image FINDINGS: Frontal image shows screw and plate fixation through a fracture of the distal femur with alignment near anatomic on this single frontal view. Total knee replacement is noted with the visualized femoral and tibial prosthetic components well-seated. No dislocation evident. IMPRESSION: Screw and rod fixation through distal femur fracture with alignment near anatomic on frontal view. Total knee replacement prostheses appear well seated on frontal view. Electronically Signed   By: Bretta Bang III M.D.   On: 02/03/2016 16:22      No results found for: HGBA1C Lab Results  Component Value Date   CREATININE 1.18* 02/04/2016       Scheduled Meds: . aspirin EC  325 mg Oral BID WC  . atenolol  25 mg Oral Daily  . docusate sodium  100 mg Oral BID  . estrogens (conjugated)  1.25 mg Oral Daily  . ferrous sulfate  325 mg Oral TID PC  . gabapentin  300 mg Oral TID  . hydrALAZINE  10 mg Oral Q6H  .  HYDROmorphone (DILAUDID) injection  0.5 mg Intravenous Once  . irbesartan  300 mg Oral Daily  . LORazepam  0.5 mg Oral Q8H  . montelukast  10 mg Oral QHS  . pantoprazole  40 mg Oral Daily  . pravastatin  40 mg Oral q1800  . vitamin C  500 mg Oral Daily   Continuous Infusions: . 0.9 % NaCl with KCl 40 mEq / L    . lactated ringers       LOS: 2 days    Time spent: >30 MINS    [facility]  [facility] Pager [phone]. If 7PM-7AM, please contact night-coverage at [email or web address], password [facility] 02/04/2016, 11:42 AM  LOS: 2 days

## 2016-02-05 LAB — CBC
HEMATOCRIT: 27.4 % — AB (ref 36.0–46.0)
Hemoglobin: 8.7 g/dL — ABNORMAL LOW (ref 12.0–15.0)
MCH: 26.4 pg (ref 26.0–34.0)
MCHC: 31.8 g/dL (ref 30.0–36.0)
MCV: 83.3 fL (ref 78.0–100.0)
Platelets: 176 10*3/uL (ref 150–400)
RBC: 3.29 MIL/uL — ABNORMAL LOW (ref 3.87–5.11)
RDW: 15.4 % (ref 11.5–15.5)
WBC: 6.5 10*3/uL (ref 4.0–10.5)

## 2016-02-05 LAB — COMPREHENSIVE METABOLIC PANEL
ALT: 9 U/L — AB (ref 14–54)
AST: 11 U/L — AB (ref 15–41)
Albumin: 2.4 g/dL — ABNORMAL LOW (ref 3.5–5.0)
Alkaline Phosphatase: 35 U/L — ABNORMAL LOW (ref 38–126)
Anion gap: 8 (ref 5–15)
BILIRUBIN TOTAL: 0.5 mg/dL (ref 0.3–1.2)
BUN: 11 mg/dL (ref 6–20)
CALCIUM: 8 mg/dL — AB (ref 8.9–10.3)
CO2: 22 mmol/L (ref 22–32)
CREATININE: 0.74 mg/dL (ref 0.44–1.00)
Chloride: 108 mmol/L (ref 101–111)
GFR calc Af Amer: >60 mL/min (ref >60)
Glucose, Bld: 116 mg/dL — ABNORMAL HIGH (ref 65–99)
Potassium: 3.7 mmol/L (ref 3.5–5.1)
Sodium: 138 mmol/L (ref 135–145)
TOTAL PROTEIN: 5.2 g/dL — AB (ref 6.5–8.1)

## 2016-02-05 LAB — HEPATIC FUNCTION PANEL: Bilirubin, Total: 0.5 mg/dL

## 2016-02-05 LAB — BASIC METABOLIC PANEL
BUN: 11 mg/dL (ref 4–21)
Creatinine: 0.7 mg/dL (ref 0.5–1.1)
GLUCOSE: 116 mg/dL
Sodium: 138 mmol/L (ref 137–147)

## 2016-02-05 MED ORDER — METHOCARBAMOL 500 MG PO TABS: 500.0000 mg | ORAL_TABLET | Freq: Four times a day (QID) | ORAL | Status: DC | PRN

## 2016-02-05 MED ORDER — OXYCODONE-ACETAMINOPHEN 5-325 MG PO TABS: 1.0000 | ORAL_TABLET | ORAL | Status: DC | PRN

## 2016-02-05 MED ORDER — BISACODYL 10 MG RE SUPP: 10.0000 mg | Freq: Every day | RECTAL | Status: DC | PRN

## 2016-02-05 MED ORDER — POLYETHYLENE GLYCOL 3350 17 G PO PACK: 17.0000 g | PACK | Freq: Every day | ORAL | Status: DC | PRN

## 2016-02-05 MED ORDER — ACETAMINOPHEN 325 MG PO TABS: 650.0000 mg | ORAL_TABLET | Freq: Four times a day (QID) | ORAL | Status: DC | PRN

## 2016-02-05 MED ORDER — FERROUS SULFATE 325 (65 FE) MG PO TABS: 325.0000 mg | ORAL_TABLET | Freq: Three times a day (TID) | ORAL | Status: DC

## 2016-02-05 MED ORDER — HYDRALAZINE HCL 25 MG PO TABS: 25.0000 mg | ORAL_TABLET | Freq: Four times a day (QID) | ORAL | Status: DC

## 2016-02-05 MED ORDER — DOCUSATE SODIUM 100 MG PO CAPS: 100.0000 mg | ORAL_CAPSULE | Freq: Two times a day (BID) | ORAL | Status: DC

## 2016-02-05 NOTE — Progress Notes (Signed)
Occupational Therapy Evaluation Patient Details Name: Doris Pacheco L Whiten MRN: [id number] DOB: [birth year] Today's Date: 02/05/2016    History of Present Illness pt presents after a fall at home sustaining a R Distal femur fx just superior to R TKR.  pt now s/p ORIF.  pt with hx of R TKR, L THR, Bil Rotator Cuff Repairs, Anxiety, HTN, TIA, and R knee I+D with Patellectomy.     Clinical Impression   PTA, pt lived alone and was independent with ADL and mobility. Pt currently requires mod A +2 with mobility and Max A with LB ADL. Will benefit from rehab at SNF to facilitate safe return home @ PLOF. All further OT to be addressed at SNF.     Follow Up Recommendations  SNF;Supervision/Assistance - 24 hour    Equipment Recommendations  Other (comment) (TBA at SNF)    Recommendations for Other Services       Precautions / Restrictions Precautions Precautions: Fall Required Braces or Orthoses: Knee Immobilizer - Right Knee Immobilizer - Right: Other (comment) (Order states to wear prn, but not in bed.) Restrictions Weight Bearing Restrictions: Yes RLE Weight Bearing: Partial weight bearing RLE Partial Weight Bearing Percentage or Pounds: 20 (or 40lbs)      Mobility Bed Mobility Overal bed mobility: Needs Assistance Bed Mobility: Supine to Sit;Sit to Supine     Supine to sit: Min assist;HOB elevated     General bed mobility comments: Assist for RLE  Transfers Overall transfer level: Needs assistance Equipment used: Rolling walker (2 wheeled)   Sit to Stand: Mod assist         General transfer comment: Not able to pivot. REquried more assistance this pm.    Balance     Sitting balance-Leahy Scale: Fair       Standing balance-Leahy Scale: Poor                              ADL Overall ADL's : Needs assistance/impaired     Grooming: Set up   Upper Body Bathing: Set up;Sitting   Lower Body Bathing: Moderate assistance;Sit to/from stand   Upper Body  Dressing : Set up   Lower Body Dressing: Maximal assistance;Sit to/from stand   Toilet Transfer: +2 for physical assistance;Moderate assistance           Functional mobility during ADLs: Moderate assistance;+2 for physical assistance General ADL Comments: [facility]     Vision     Perception     Praxis      Pertinent Vitals/Pain Pain Assessment: 0-10 Pain Score: 3  Pain Location: RLE Pain Descriptors / Indicators: Aching;Grimacing;Guarding Pain Intervention(s): Limited activity within patient's tolerance;Repositioned     Hand Dominance     Extremity/Trunk Assessment Upper Extremity Assessment Upper Extremity Assessment: Overall WFL for tasks assessed   Lower Extremity Assessment Lower Extremity Assessment: Defer to PT evaluation RLE Deficits / Details: ROM and Strength limited by post-op pain RLE Coordination: decreased fine motor;decreased gross motor   Cervical / Trunk Assessment Cervical / Trunk Assessment: Kyphotic   Communication Communication Communication: No difficulties   Cognition Arousal/Alertness: Awake/alert Behavior During Therapy: WFL for tasks assessed/performed Overall Cognitive Status: Within Functional Limits for tasks assessed                     General Comments       Exercises       Shoulder Instructions  Home Living Family/patient expects to be discharged to:: Skilled nursing facility                                        Prior Functioning/Environment Level of Independence: Independent with assistive device(s)             OT Diagnosis: Generalized weakness;Acute pain   OT Problem List:     OT Treatment/Interventions:      OT Goals(Current goals can be found in the care plan section) Acute Rehab OT Goals Patient Stated Goal: Go to rehab to walk again OT Goal Formulation: All assessment and education complete, DC therapy (acute ot)  OT Frequency:     Barriers to  D/C:            Co-evaluation              End of Session Equipment Utilized During Treatment: Gait belt;Rolling walker Nurse Communication: Mobility status  Activity Tolerance: Patient tolerated treatment well Patient left: in bed;with call bell/phone within reach;with bed alarm set;with family/visitor present   Time: 1510-1530 OT Time Calculation (min): 20 min Charges:  OT General Charges $OT Visit: 1 Procedure OT Evaluation $OT Eval Moderate Complexity: 1 Procedure G-Codes:    Mana Morison,HILLARY 02-27-16, 3:33 PM   [facility] [place], OTR/L  [phone] 27-Feb-2016

## 2016-02-05 NOTE — Clinical Social Work Placement (Signed)
   CLINICAL SOCIAL WORK PLACEMENT  NOTE  Date:  02/05/2016  Patient Details  Name: Doris Pacheco MRN: [id number] Date of Birth: 1941-07-04  Clinical Social Work is seeking post-discharge placement for this patient at the Skilled  Nursing Facility level of care (*CSW will initial, date and re-position this form in  chart as items are completed):  Yes   Patient/family provided with [facility] [place] Clinical Social Work Department's list of facilities offering this level of care within the geographic area requested by the patient (or if unable, by the patient's family).  Yes   Patient/family informed of their freedom to choose among providers that offer the needed level of care, that participate in Medicare, Medicaid or managed care program needed by the patient, have an available bed and are willing to accept the patient.  Yes   Patient/family informed of [facility] ownership interest [facility] and [facility], as well as of the fact that they are under no obligation to receive care at these facilities.  PASRR submitted to EDS on       PASRR number received on       Existing PASRR number confirmed on 02/05/16     FL2 transmitted to all facilities in geographic area requested by pt/family on 02/05/16     FL2 transmitted to all facilities within larger geographic area on       Patient informed that his/her managed care company has contracts with or will negotiate with certain facilities, including the following:        Yes   Patient/family informed of bed offers received.  [facility]     Physician recommends and patient chooses bed at      [facility]  .  Patient to be transferred to facility by       Patient family notified on   of transfer.  Name of family member notified:        PHYSICIAN       Additional Comment:    _______________________________________________ Rod MaeVaughn, Sharronda Schweers S,  LCSW 02/05/2016, 11:42 AM

## 2016-02-05 NOTE — Discharge Summary (Signed)
Physician Discharge Summary  Doris Pacheco MRN: [id number] DOB/AGE: 1941/09/18 75 y.o.  PCP: Ralene Ok, MD   Admit date: 02/02/2016 Discharge date: 02/05/2016  Discharge Diagnoses:     Principal Problem:   Fracture of femur, distal, right, closed (HCC) Active Problems:   Loose total knee arthroplasty - right    Hypertension   Anxiety   History of CVA (cerebrovascular accident)   GERD (gastroesophageal reflux disease)   Arthritis   Osteoporosis   Left ventricular diastolic dysfunction   Anemia   Femoral distal fracture, right, closed, initial encounter   Abrasion, knee   Closed fracture of right distal femur (HCC)    Follow-up recommendations Follow-up with PCP in 3-5 days , including all  additional recommended appointments as below Follow-up CBC, CMP in 3-5 days DVT Prophylaxis: SCDx72hrs, ASA 325 mg BID x 2 weeks PT/OT 40 lbs weight bearing to right leg DISCHARGE NEEDS: HHPT, Walker and 3-in-1 comode seat    Current Discharge Medication List    START taking these medications   Details  acetaminophen (TYLENOL) 325 MG tablet Take 2 tablets (650 mg total) by mouth every 6 (six) hours as needed for mild pain (or Fever >/= 101). Qty: 30 tablet, Refills: 1    aspirin EC 325 MG tablet Take 1 tablet (325 mg total) by mouth 2 (two) times daily. Qty: 30 tablet, Refills: 0    bisacodyl (DULCOLAX) 10 MG suppository Place 1 suppository (10 mg total) rectally daily as needed for moderate constipation. Qty: 12 suppository, Refills: 0    docusate sodium (COLACE) 100 MG capsule Take 1 capsule (100 mg total) by mouth 2 (two) times daily. Qty: 10 capsule, Refills: 0    ferrous sulfate 325 (65 FE) MG tablet Take 1 tablet (325 mg total) by mouth 3 (three) times daily after meals. Qty: 90 tablet, Refills: 3    hydrALAZINE (APRESOLINE) 25 MG tablet Take 1 tablet (25 mg total) by mouth every 6 (six) hours. Qty: 90 tablet, Refills: 1    methocarbamol (ROBAXIN) 500 MG tablet  Take 1 tablet (500 mg total) by mouth every 6 (six) hours as needed for muscle spasms. Qty: 30 tablet, Refills: 0    oxyCODONE-acetaminophen (ROXICET) 5-325 MG tablet Take 1 tablet by mouth every 4 (four) hours as needed. Qty: 60 tablet, Refills: 0    polyethylene glycol (MIRALAX / GLYCOLAX) packet Take 17 g by mouth daily as needed for mild constipation. Qty: 14 each, Refills: 0    tizanidine (ZANAFLEX) 2 MG capsule Take 1 capsule (2 mg total) by mouth 3 (three) times daily. Qty: 60 capsule, Refills: 0      CONTINUE these medications which have NOT CHANGED   Details  atenolol (TENORMIN) 25 MG tablet Take 25 mg by mouth daily.    CALCIUM-VITAMIN D PO Take 1,200 mg by mouth daily.     Cholecalciferol (VITAMIN D3 PO) Take 1 tablet by mouth daily.    diclofenac sodium (VOLTAREN) 1 % GEL Apply 2 g topically 2 (two) times daily as needed (pain).    esomeprazole (NEXIUM) 40 MG capsule Take 40 mg by mouth daily before breakfast.    gabapentin (NEURONTIN) 300 MG capsule Take 300 mg by mouth every other day.     Insulin Pen Needle 32G X 4 MM MISC Pt to use once a day for with her Forteo pen Qty: 100 each, Refills: 3    LORazepam (ATIVAN) 0.5 MG tablet Take 0.5 mg by mouth at bedtime.     Meth-Hyo-M  Bl-Na Phos-Ph Sal (URIBEL) 118 MG CAPS Take 118 mg by mouth daily.     montelukast (SINGULAIR) 10 MG tablet Take 10 mg by mouth at bedtime.    Pitavastatin Calcium (LIVALO) 2 MG TABS Take 2 mg by mouth daily.    valsartan (DIOVAN) 320 MG tablet Take 320 mg by mouth daily.    vitamin C (ASCORBIC ACID) 500 MG tablet Take 500 mg by mouth daily.    VITAMIN E PO Take 1 capsule by mouth daily.     Teriparatide, Recombinant, (FORTEO [place]) Inject into the skin. Reported on 02/02/2016      STOP taking these medications     estrogens, conjugated, (PREMARIN) 1.25 MG tablet      HYDROcodone-acetaminophen (NORCO) 10-325 MG tablet      meloxicam (MOBIC) 15 MG tablet      traMADol (ULTRAM) 50  MG tablet      triamterene-hydrochlorothiazide (MAXZIDE-25) 37.5-25 MG per tablet          Discharge Condition: Stable  Discharge Instructions Get Medicines reviewed and adjusted: Please take all your medications with you for your next visit with your Primary MD  Please request your Primary MD to go over all hospital tests and procedure/radiological results at the follow up, please ask your Primary MD to get all Hospital records sent to his/her office.  If you experience worsening of your admission symptoms, develop shortness of breath, life threatening emergency, suicidal or homicidal thoughts you must seek medical attention immediately by calling 911 or calling your MD immediately if symptoms less severe.  You must read complete instructions/literature along with all the possible adverse reactions/side effects for all the Medicines you take and that have been prescribed to you. Take any new Medicines after you have completely understood and accpet all the possible adverse reactions/side effects.   Do not drive when taking Pain medications.   Do not take more than prescribed Pain, Sleep and Anxiety Medications  Special Instructions: If you have smoked or chewed Tobacco in the last 2 yrs please stop smoking, stop any regular Alcohol and or any Recreational drug use.  Wear Seat belts while driving.  Please note  You were cared for by a hospitalist during your hospital stay. Once you are discharged, your primary care physician will handle any further medical issues. Please note that NO REFILLS for any discharge medications will be authorized once you are discharged, as it is imperative that you return to your primary care physician (or establish a relationship with a primary care physician if you do not have one) for your aftercare needs so that they can reassess your need for medications and monitor your lab values.  Discharge Instructions    Diet - low sodium heart healthy     Complete by:  As directed      Increase activity slowly    Complete by:  As directed      Partial weight bearing    Complete by:  As directed   % Body Weight:  40 lbs  Laterality:  right  Extremity:  Lower            Allergies  Allergen Reactions  . Forteo [Teriparatide (Recombinant)] Shortness Of Breath  . Tape Rash    Paper tape OK      Disposition: 01-Home or Self Care   Consults:  Orthopedics   Significant Diagnostic Studies:  Dg Chest 2 View  02/02/2016  CLINICAL DATA:  Preop evaluation for broken right femur EXAM: CHEST  2 VIEW COMPARISON:  07/25/2015 FINDINGS: The heart size and mediastinal contours are within normal limits. Both lungs are clear. The visualized skeletal structures shows stable degenerative changes of the thoracic spine with increase kyphosis. IMPRESSION: No active cardiopulmonary disease. Electronically Signed   By: Inez Catalina M.D.   On: 02/02/2016 13:25   Dg Knee 1-2 Views Right  02/03/2016  CLINICAL DATA:  Open reduction internal fixation for distal femur fracture EXAM: DG C-ARM 61-120 MIN; RIGHT KNEE - 1-2 VIEW COMPARISON:  Feb 02, 2016 FLUOROSCOPY TIME:  1 minutes 49 seconds; 1 acquired image FINDINGS: Frontal image shows screw and plate fixation through a fracture of the distal femur with alignment near anatomic on this single frontal view. Total knee replacement is noted with the visualized femoral and tibial prosthetic components well-seated. No dislocation evident. IMPRESSION: Screw and rod fixation through distal femur fracture with alignment near anatomic on frontal view. Total knee replacement prostheses appear well seated on frontal view. Electronically Signed   By: Lowella Grip III M.D.   On: 02/03/2016 16:22   Dg Knee Complete 4 Views Right  02/02/2016  CLINICAL DATA:  Tripped and fell on concrete this morning, EXAM: RIGHT KNEE - COMPLETE 4+ VIEW COMPARISON:  None. FINDINGS: Four views of the right knee submitted. There is right knee  prosthesis with anatomic alignment. Mild displaced minimal angulated comminuted fracture in distal right femoral metaphysis above the level of prosthesis. IMPRESSION: There is right knee prosthesis with anatomic alignment. Mild displaced minimal angulated comminuted fracture in distal right femoral metaphysis above the level of prosthesis. Electronically Signed   By: Lahoma Crocker M.D.   On: 02/02/2016 12:22   Dg C-arm 1-60 Min  02/03/2016  CLINICAL DATA:  Open reduction internal fixation for distal femur fracture EXAM: DG C-ARM 61-120 MIN; RIGHT KNEE - 1-2 VIEW COMPARISON:  Feb 02, 2016 FLUOROSCOPY TIME:  1 minutes 49 seconds; 1 acquired image FINDINGS: Frontal image shows screw and plate fixation through a fracture of the distal femur with alignment near anatomic on this single frontal view. Total knee replacement is noted with the visualized femoral and tibial prosthetic components well-seated. No dislocation evident. IMPRESSION: Screw and rod fixation through distal femur fracture with alignment near anatomic on frontal view. Total knee replacement prostheses appear well seated on frontal view. Electronically Signed   By: Lowella Grip III M.D.   On: 02/03/2016 16:22       There were no vitals filed for this visit.   Microbiology: Recent Results (from the past 240 hour(s))  Surgical pcr screen     Status: None   Collection Time: 02/03/16  3:47 AM  Result Value Ref Range Status   MRSA, PCR NEGATIVE NEGATIVE Final   Staphylococcus aureus NEGATIVE NEGATIVE Final    Comment:        The Xpert SA Assay (FDA approved for NASAL specimens in patients over 21 years of age), is one component of a comprehensive surveillance program.  Test performance has been validated by [facility] for patients greater than or equal to 61 year old. It is not intended to diagnose infection nor to guide or monitor treatment.        Blood Culture    Component Value Date/Time   SDES URINE, CLEAN CATCH  05/25/2010 1125   SPECREQUEST NONE 05/25/2010 1125   CULT NO GROWTH 05/25/2010 1125   REPTSTATUS 05/26/2010 FINAL 05/25/2010 1125      Labs: Results for orders placed or performed during the hospital  encounter of 02/02/16 (from the past 48 hour(s))  CBC     Status: Abnormal   Collection Time: 02/04/16  7:05 AM  Result Value Ref Range   WBC 6.6 4.0 - 10.5 K/uL   RBC 3.33 (L) 3.87 - 5.11 MIL/uL   Hemoglobin 9.1 (L) 12.0 - 15.0 g/dL   HCT 28.1 (L) 36.0 - 46.0 %   MCV 84.4 78.0 - 100.0 fL   MCH 27.3 26.0 - 34.0 pg   MCHC 32.4 30.0 - 36.0 g/dL   RDW 15.3 11.5 - 15.5 %   Platelets 165 150 - 400 K/uL  Comprehensive metabolic panel     Status: Abnormal   Collection Time: 02/04/16  7:05 AM  Result Value Ref Range   Sodium 137 135 - 145 mmol/L   Potassium 3.4 (L) 3.5 - 5.1 mmol/L   Chloride 105 101 - 111 mmol/L   CO2 21 (L) 22 - 32 mmol/L   Glucose, Bld 120 (H) 65 - 99 mg/dL   BUN 13 6 - 20 mg/dL   Creatinine, Ser 1.18 (H) 0.44 - 1.00 mg/dL   Calcium 7.6 (L) 8.9 - 10.3 mg/dL   Total Protein 5.2 (L) 6.5 - 8.1 g/dL   Albumin 2.5 (L) 3.5 - 5.0 g/dL   AST 14 (L) 15 - 41 U/L   ALT 9 (L) 14 - 54 U/L   Alkaline Phosphatase 24 (L) 38 - 126 U/L   Total Bilirubin 0.5 0.3 - 1.2 mg/dL   GFR calc non Af Amer 44 (L) >60 mL/min   GFR calc Af Amer 51 (L) >60 mL/min    Comment: (NOTE) The eGFR has been calculated using the CKD EPI equation. This calculation has not been validated in all clinical situations. eGFR's persistently <60 mL/min signify possible Chronic Kidney Disease.    Anion gap 11 5 - 15  Magnesium     Status: None   Collection Time: 02/04/16 11:52 AM  Result Value Ref Range   Magnesium 1.9 1.7 - 2.4 mg/dL  CBC     Status: Abnormal   Collection Time: 02/05/16  8:09 AM  Result Value Ref Range   WBC 6.5 4.0 - 10.5 K/uL   RBC 3.29 (L) 3.87 - 5.11 MIL/uL   Hemoglobin 8.7 (L) 12.0 - 15.0 g/dL   HCT 27.4 (L) 36.0 - 46.0 %   MCV 83.3 78.0 - 100.0 fL   MCH 26.4 26.0 - 34.0 pg    MCHC 31.8 30.0 - 36.0 g/dL   RDW 15.4 11.5 - 15.5 %   Platelets 176 150 - 400 K/uL  Comprehensive metabolic panel     Status: Abnormal   Collection Time: 02/05/16  8:09 AM  Result Value Ref Range   Sodium 138 135 - 145 mmol/L   Potassium 3.7 3.5 - 5.1 mmol/L   Chloride 108 101 - 111 mmol/L   CO2 22 22 - 32 mmol/L   Glucose, Bld 116 (H) 65 - 99 mg/dL   BUN 11 6 - 20 mg/dL   Creatinine, Ser 0.74 0.44 - 1.00 mg/dL   Calcium 8.0 (L) 8.9 - 10.3 mg/dL   Total Protein 5.2 (L) 6.5 - 8.1 g/dL   Albumin 2.4 (L) 3.5 - 5.0 g/dL   AST 11 (L) 15 - 41 U/L   ALT 9 (L) 14 - 54 U/L   Alkaline Phosphatase 35 (L) 38 - 126 U/L   Total Bilirubin 0.5 0.3 - 1.2 mg/dL   GFR calc non Af Amer >60 >60 mL/min  GFR calc Af Amer >60 >60 mL/min    Comment: (NOTE) The eGFR has been calculated using the CKD EPI equation. This calculation has not been validated in all clinical situations. eGFR's persistently <60 mL/min signify possible Chronic Kidney Disease.    Anion gap 8 5 - 15     Lipid Panel  No results found for: CHOL, TRIG, HDL, CHOLHDL, VLDL, LDLCALC, LDLDIRECT   No results found for: HGBA1C   Lab Results  Component Value Date   CREATININE 0.74 02/05/2016    75 y.o. female with medical history significant for hypertension, HTN with history of moderate concentric hypertrophy and associated left ventricular diastolic dysfunction, anxiety disorder, GERD, prior CVA, osteoarthritis, severe osteoporosis, and history of right TKA in 2010. Patient reports a mechanical fall today and landed on right leg and developed significant pain and therefore presented to the ER. She was not having any palpitations, dizziness chest pain or shortness of breath prior to onset of fall.  Assessment and plan Fracture of femur, distal, right, closed / Osteoporosis -Patient with mechanical fall and subsequent distal femur fracture Patient seen by Dr.Rowan, status post open reduction internal fixation 5/17 Vitamin D  levels okay -Continue preadmission calcium with vitamin D and Forteo PT OT recommended SNF Per orthopedics aspirin 325 mg twice a day 2 weeks for DVT prophylaxis -Patient on estrogen based hormone replacement prior to admission, this will be held until patient is ambulatory, therefore estrogen has been discontinued -Preoperative EKG and chest x-ray unremarkable  Acute kidney injury, creatinine has increased from 0.86-1.18, blood pressure has been soft, therefore discontinued Diovan and started IV fluids, creatinine improved, restarted Diovan, holding Maxide   Loose total knee arthroplasty - right /Arthritis -Patient with recent issues of tightness and pain above-knee and has recently been started on Voltaren gel and gabapentin Continue Percocet for pain control,   Hypertension/Left ventricular diastolic dysfunction Was on Maxzide and Diovan, hold Maxzide and resume Diovan and discharge Continue atenolol and hydralazine dose has been increased to 25 mg 3 times a day   History of CVA (cerebrovascular accident) -Continue Livalo and lovastatin   Anxiety -Recently started on Ativan tabs   GERD (gastroesophageal reflux disease) -Therapeutic substitution for Nexium   Anemia -Hgb has trended down slowly 11.8>9.1 Continue to follow   Discharge Exam: *   Blood pressure 114/53, pulse 76, temperature 98.7 F (37.1 C), temperature source Oral, resp. rate 18, SpO2 100 %. General exam: Appears calm and comfortable  Respiratory system: Clear to auscultation. Respiratory effort normal. Cardiovascular system: S1 & S2 heard, RRR. No JVD, murmurs, rubs, gallops or clicks. No pedal edema. Gastrointestinal system: Abdomen is nondistended, soft and nontender. No organomegaly or masses felt. Normal bowel sounds heard. Central nervous system: Alert and oriented. No focal neurological deficits. Extremities: Symmetric 5 x 5 power. Skin: No rashes, lesions or ulcers Psychiatry: Judgement and  insight appear normal. Mood & affect appropriate.      Follow-up Information    Follow up with Kerin Salen, MD In 2 weeks.   Specialty:  Orthopedic Surgery   Contact information:   [place] [id number] [phone]       Signed: Reyne Dumas 02/05/2016, 3:06 PM        Time spent >45 mins

## 2016-02-05 NOTE — Clinical Social Work Note (Signed)
Clinical Social Work Assessment  Patient Details  Name: Doris Pacheco MRN: [id number] Date of Birth: 1941-06-18  Date of referral:  02/05/16               Reason for consult:  Facility Placement                Permission sought to share information with:  Chartered certified accountant granted to share information::  Yes, Verbal Permission Granted  Name::        Agency::  [facility]  Relationship::     Contact Information:     Housing/Transportation Living arrangements for the past 2 months:  Single Family Home Source of Information:  Patient Patient Interpreter Needed:  None Criminal Activity/Legal Involvement Pertinent to Current Situation/Hospitalization:  No - Comment as needed Significant Relationships:  Adult Children Lives with:  Self Do you feel safe going back to the place where you live?  Yes Need for family participation in patient care:  No (Coment) (Patient able to make own decisions.)  Care giving concerns:  Patient expressed no concerns at this time.    Social Worker assessment / plan:  LCSW received referral for possible SNF placement at time of discharge. LCSW met with patient at bedside to discuss discharge plan. Per patient, patient anticipates being discharged to [facility] once medically stable for discharge. Patient states patient will be transported via car once medically stable for discharge. LCSW to continue to follow and assist with discharge planning needs.  Employment status:  Retired Forensic scientist:  Office manager) PT Recommendations:  [place] / Referral to community resources:  [place]  Patient/Family's Response to care:  Patient understanding and agreeable to [employer] of care.  Patient/Family's Understanding of and Emotional Response to Diagnosis, Current Treatment, and Prognosis:  Patient understanding and agreeable to LCSW plan of  care.  Emotional Assessment Appearance:  Appears stated age Attitude/Demeanor/Rapport:  Other (appropriate) Affect (typically observed):  Accepting, Appropriate, Pleasant Orientation:  Oriented to Self, Oriented to Place, Oriented to  Time, Oriented to Situation Alcohol / Substance use:  Not Applicable Psych involvement (Current and /or in the community):  No (Comment) (Not appropriate on this admission.)  Discharge Needs  Concerns to be addressed:  No discharge needs identified Readmission within the last 30 days:  No Current discharge risk:  None Barriers to Discharge:  No Barriers Identified   Caroline Sauger, LCSW 02/05/2016, 11:34 AM

## 2016-02-05 NOTE — Progress Notes (Signed)
PATIENT ID: Doris Pacheco  MRN: [id number]  DOB/AGE:  1941/03/21 / 75 y.o.  2 Days Post-Op Procedure(s) (LRB): OPEN REDUCTION INTERNAL FIXATION (ORIF) DISTAL FEMUR FRACTURE (Right)    PROGRESS NOTE Subjective: Patient is alert, oriented, no Nausea, no Vomiting, yes passing gas. Taking PO well. Denies SOB, Chest or Calf Pain. Using Incentive Spirometer, PAS in place. Ambulate  Pt is 40 lbs weight bearing to the right leg,  Patient reports pain as mild .    Objective: Vital signs in last 24 hours: Filed Vitals:   02/04/16 0500 02/04/16 1741 02/04/16 2004 02/05/16 0424  BP: 125/45 118/55 125/48 144/57  Pulse: 62 75 84 87  Temp: 98.1 F (36.7 C) 98.7 F (37.1 C) 98.1 F (36.7 C) 98.6 F (37 C)  TempSrc: Oral Oral Oral Oral  Resp:   18 18  SpO2: 98% 98% 99% 99%      Intake/Output from previous day: I/O last 3 completed shifts: In: 960 [P.O.:960] Out: -    Intake/Output this shift:     LABORATORY DATA:  Recent Labs  02/02/16 1448  02/04/16 0705 02/05/16 0809  WBC  --   < > 6.6 6.5  HGB  --   < > 9.1* 8.7*  HCT  --   < > 28.1* 27.4*  PLT  --   < > 165 176  NA  --   < > 137 138  K  --   < > 3.4* 3.7  CL  --   < > 105 108  CO2  --   < > 21* 22  BUN  --   < > 13 11  CREATININE  --   < > 1.18* 0.74  GLUCOSE  --   < > 120* 116*  INR 1.04  --   --   --   CALCIUM 8.9  < > 7.6* 8.0*  < > = values in this interval not displayed.  Examination: Neurologically intact Neurovascular intact Sensation intact distally Intact pulses distally Dorsiflexion/Plantar flexion intact Incision: moderate drainage No cellulitis present Compartment soft}  Assessment:   2 Days Post-Op Procedure(s) (LRB): OPEN REDUCTION INTERNAL FIXATION (ORIF) DISTAL FEMUR FRACTURE (Right) ADDITIONAL DIAGNOSIS: Expected Acute Blood Loss Anemia, Hypertension  Plan: PT/OT  40 lbs weight bearing to right leg DVT Prophylaxis:  SCDx72hrs, ASA 325 mg BID x 2 weeks DISCHARGE PLAN: Skilled Nursing  Facility/Rehab DISCHARGE NEEDS: HHPT, Walker and 3-in-1 comode seat     PHILLIPS, ERIC R 02/05/2016, 10:02 AM

## 2016-02-06 DIAGNOSIS — Z8673 Personal history of transient ischemic attack (TIA), and cerebral infarction without residual deficits: Secondary | ICD-10-CM

## 2016-02-06 MED ORDER — SENNOSIDES-DOCUSATE SODIUM 8.6-50 MG PO TABS
1.0000 | ORAL_TABLET | Freq: Two times a day (BID) | ORAL | Status: DC
  Administered 2016-02-06 – 2016-02-08 (×5): 1 via ORAL
  Filled 2016-02-06 (×5): qty 1

## 2016-02-06 MED ORDER — POLYETHYLENE GLYCOL 3350 17 G PO PACK
17.0000 g | PACK | Freq: Two times a day (BID) | ORAL | Status: DC
  Administered 2016-02-06 – 2016-02-08 (×5): 17 g via ORAL
  Filled 2016-02-06 (×5): qty 1

## 2016-02-06 MED ORDER — OXYCODONE-ACETAMINOPHEN 5-325 MG PO TABS
1.0000 | ORAL_TABLET | Freq: Four times a day (QID) | ORAL | Status: DC | PRN
  Administered 2016-02-06 – 2016-02-08 (×8): 2 via ORAL
  Filled 2016-02-06 (×8): qty 2

## 2016-02-06 MED ORDER — OXYCODONE-ACETAMINOPHEN 5-325 MG PO TABS: 1.0000 | ORAL_TABLET | Freq: Four times a day (QID) | ORAL | Status: DC | PRN

## 2016-02-06 NOTE — Progress Notes (Signed)
[facility] Progress Note  Patient: Doris Pacheco HYQ:[id number]   PCP: Ralene Ok, MD DOB: 1940-12-04   DOA: 02/02/2016   DOS: 02/06/2016   Date of Service: the patient was seen and examined on 02/06/2016  Subjective: pain has been not well controlled, also has constipation no nausea or vomiting Nutrition: tolerating oral diet  Brief hospital course: Patient was admitted on 02/02/2016, with complaint of mechanic fall, was found to have right femur fracture. S/p ORIF, pain has been not well controlled Currently further plan is monitor for pain improvement and constipation resolution.  Assessment and Plan: Fracture of femur, distal, right, closed / Osteoporosis -Patient with mechanical fall and subsequent distal femur fracture Patient seen by Dr.Rowan, status post open reduction internal fixation 5/17 Vitamin D levels okay -Continue preadmission calcium with vitamin D and Forteo PT OT recommended SNF Per orthopedics aspirin 325 mg twice a day 2 weeks for DVT prophylaxis -Patient on estrogen based hormone replacement prior to admission, this will be held until patient is ambulatory, therefore estrogen has been discontinued -Preoperative EKG and chest x-ray unremarkable  Acute kidney injury, creatinine has increased from 0.86-1.18, blood pressure has been soft, therefore discontinued Diovan and started IV fluids, creatinine improved, restarted Diovan, holding Maxide   total knee arthroplasty - right /Arthritis -Patient with recent issues of tightness and pain above-knee and has recently been started on Voltaren gel and gabapentin Continue Percocet for pain control,   Hypertension/Left ventricular diastolic dysfunction  hold Maxzide and Diovan Continue atenolol and hydralazine dose has been increased to 25 mg 3 times a day   History of CVA (cerebrovascular accident) -Continue Livalo and lovastatin   Anxiety -Recently started on Ativan tabs   GERD (gastroesophageal  reflux disease) -Therapeutic substitution for Nexium   Anemia -Hgb has trended down slowly 11.8>9.1 Continue to follow  Pain management: increased to percocet 1-2 tablet, q6. Activity: SNF per physical therapy Bowel regimen: last BM 02/02/2016, stool softner added Diet: cardiac diet DVT Prophylaxis: subcutaneous Heparin  Advance goals of care discussion: DNR  Family Communication: no family was present at bedside, at the time of interview.   Disposition:  Discharge to SNF, pending constipation resolution Expected discharge date: 02/08/2016  Consultants: orthopedic Procedures: ORIF  Antibiotics: Anti-infectives    Start     Dose/Rate Route Frequency Ordered Stop   02/03/16 0600  ceFAZolin (ANCEF) IVPB 2g/100 mL premix     2 g 200 mL/hr over 30 Minutes Intravenous On call to O.R. 02/02/16 1803 02/03/16 1433        Intake/Output Summary (Last 24 hours) at 02/06/16 1746 Last data filed at 02/06/16 0700  Gross per 24 hour  Intake    480 ml  Output      0 ml  Net    480 ml   There were no vitals filed for this visit.  Objective: Physical Exam: Filed Vitals:   02/06/16 0554 02/06/16 0907 02/06/16 1323 02/06/16 1500  BP: 114/51 123/42 146/76 148/74  Pulse: 78 75  77  Temp: 98.1 F (36.7 C) 98.5 F (36.9 C)  98.1 F (36.7 C)  TempSrc: Oral Oral  Oral  Resp: 18   18  SpO2: 99% 98%  99%    General: Alert, Awake and Oriented to Time, Place and Person. Appear in mild distress Eyes: PERRL, Conjunctiva normal ENT: Oral Mucosa clear moist. Neck: no JVD, no Abnormal Mass Or lumps Cardiovascular: S1 and S2 Present, no Murmur, Peripheral Pulses Present Respiratory: Bilateral Air entry equal and Decreased,  Clear to Auscultation, no Crackles, no wheezes Abdomen: Bowel Sound present, Soft and no tenderness Skin: no redness, no Rash  Extremities: no Pedal edema, no calf tenderness Neurologic: Grossly no focal neuro deficit. Bilaterally Equal motor strength  Data  Reviewed: CBC:  Recent Labs Lab 02/02/16 1300 02/03/16 0544 02/04/16 0705 02/05/16 0809  WBC 6.5 7.0 6.6 6.5  NEUTROABS 5.3  --   --   --   HGB 11.8* 10.2* 9.1* 8.7*  HCT 36.3 31.0* 28.1* 27.4*  MCV 82.9 83.6 84.4 83.3  PLT 231 187 165 176   Basic Metabolic Panel:  Recent Labs Lab 02/02/16 1300 02/02/16 1448 02/03/16 0544 02/04/16 0705 02/04/16 1152 02/05/16 0809  NA 136  --  139 137  --  138  K 3.4*  --  4.1 3.4*  --  3.7  CL 102  --  105 105  --  108  CO2 23  --  23 21*  --  22  GLUCOSE 103*  --  99 120*  --  116*  BUN 18  --  16 13  --  11  CREATININE 0.79  --  0.86 1.18*  --  0.74  CALCIUM 9.0 8.9 8.4* 7.6*  --  8.0*  MG  --   --   --   --  1.9  --     Liver Function Tests:  Recent Labs Lab 02/02/16 1448 02/04/16 0705 02/05/16 0809  AST  --  14* 11*  ALT  --  9* 9*  ALKPHOS  --  24* 35*  BILITOT  --  0.5 0.5  PROT  --  5.2* 5.2*  ALBUMIN 3.6 2.5* 2.4*   No results for input(s): LIPASE, AMYLASE in the last 168 hours. No results for input(s): AMMONIA in the last 168 hours. Coagulation Profile:  Recent Labs Lab 02/02/16 1448  INR 1.04   Cardiac Enzymes: No results for input(s): CKTOTAL, CKMB, CKMBINDEX, TROPONINI in the last 168 hours. BNP (last 3 results) No results for input(s): PROBNP in the last 8760 hours.  CBG: No results for input(s): GLUCAP in the last 168 hours.  Studies: No results found.   Scheduled Meds: . aspirin EC  325 mg Oral BID WC  . atenolol  25 mg Oral Daily  . ferrous sulfate  325 mg Oral TID PC  . gabapentin  300 mg Oral TID  . hydrALAZINE  10 mg Oral Q6H  . LORazepam  0.5 mg Oral Q8H  . montelukast  10 mg Oral QHS  . pantoprazole  40 mg Oral Daily  . polyethylene glycol  17 g Oral BID  . pravastatin  40 mg Oral q1800  . senna-docusate  1 tablet Oral BID  . vitamin C  500 mg Oral Daily   Continuous Infusions:  PRN Meds: acetaminophen **OR** acetaminophen, bisacodyl, hydrALAZINE, menthol-cetylpyridinium **OR**  phenol, methocarbamol **OR** methocarbamol (ROBAXIN)  IV, metoCLOPramide **OR** metoCLOPramide (REGLAN) injection, morphine injection, ondansetron (ZOFRAN) IV, oxyCODONE-acetaminophen  Time spent: 30 minutes  Author: Lynden OxfordPranav Damarcus Reggio, MD [facility] Pager: [phone] 02/06/2016 5:46 PM  If 7PM-7AM, please contact night-coverage at [email or web address], password [email or web address]

## 2016-02-06 NOTE — Progress Notes (Signed)
Subjective: 3 Days Post-Op Procedure(s) (LRB): OPEN REDUCTION INTERNAL FIXATION (ORIF) DISTAL FEMUR FRACTURE (Right)  Activity level: 40lb weightbearing on right leg Diet tolerance:  ok Voiding:  ok Patient reports pain as mild.    Objective: Vital signs in last 24 hours: Temp:  [98 F (36.7 C)-98.7 F (37.1 C)] 98.1 F (36.7 C) (05/20 0554) Pulse Rate:  [76-79] 78 (05/20 0554) Resp:  [18] 18 (05/20 0554) BP: (114-133)/(51-56) 114/51 mmHg (05/20 0554) SpO2:  [99 %-100 %] 99 % (05/20 0554)  Labs:  Recent Labs  02/04/16 0705 02/05/16 0809  HGB 9.1* 8.7*    Recent Labs  02/04/16 0705 02/05/16 0809  WBC 6.6 6.5  RBC 3.33* 3.29*  HCT 28.1* 27.4*  PLT 165 176    Recent Labs  02/04/16 0705 02/05/16 0809  NA 137 138  K 3.4* 3.7  CL 105 108  CO2 21* 22  BUN 13 11  CREATININE 1.18* 0.74  GLUCOSE 120* 116*  CALCIUM 7.6* 8.0*   No results for input(s): LABPT, INR in the last 72 hours.  Physical Exam:  Neurologically intact ABD soft Neurovascular intact Sensation intact distally Intact pulses distally Dorsiflexion/Plantar flexion intact Incision: dressing C/D/I and no drainage No cellulitis present Compartment soft  Assessment/Plan:  3 Days Post-Op Procedure(s) (LRB): OPEN REDUCTION INTERNAL FIXATION (ORIF) DISTAL FEMUR FRACTURE (Right) PT/OT 40 lbs weight bearing to right leg DVT Prophylaxis: SCDx72hrs, ASA 325 mg BID x 2 weeks DISCHARGE PLAN: Skilled Nursing Facility/Rehab today DISCHARGE NEEDS: HHPT, Walker and 3-in-1 comode seat  Britt Theard PAUL 02/06/2016, 8:06 AM

## 2016-02-06 NOTE — Care Management (Signed)
Case manager discussed patient disposition with social worker, Lovette ClicheDonna Crowder. [facility], she is discussing this with patient and another facility will be selected.

## 2016-02-06 NOTE — Care Management Important Message (Signed)
Important Message  Patient Details  Name: Doris Pacheco MRN: [id number] Date of Birth: 16-Aug-1941   Medicare Important Message Given:  Yes    Durenda GuthrieBrady, Malayja Freund Naomi, RN 02/06/2016, 9:23 AM

## 2016-02-06 NOTE — Progress Notes (Signed)
Patient was set up to go to [facility] today but CSW was notified by SNF that this facility is out of network with patient's [employer] and patient would be responsible for 40% copay. Patient and son [name] indicated that patient cannot pay the copays. CSW searched other bed offers- [facility] is also out of network and [facility] indicated they could not get auth until later next week and would not accept a letter of guarantee.  CSW was notified that patient was medically stable for d/c.  CSW met with patient and called son [name] on phone.  Patient and son agreed to d/c home over the weekend with 2 sons and daughter providing care and then [facility] would initiate [facility]. Patient would be able to go to SNF after that.  CSW notified later that d/c has been cancelled due to unsafe d/c plan.   CSW spoke multiple times with [place] [facility]. She feels that she will be able to obtain insurance auth either tomorrow or Monday morning. If tomorrow- can accept patient tomorrow.  Patient, son [name] and Daughter [name] notified and are very pleased with this arrangement.  Handoff report to Sunday CSW re: this.  Lorie Phenix. Pauline Good, [place]  (weekend coverage)

## 2016-02-07 DIAGNOSIS — M810 Age-related osteoporosis without current pathological fracture: Secondary | ICD-10-CM

## 2016-02-07 LAB — CBC
HEMATOCRIT: 25.1 % — AB (ref 36.0–46.0)
Hemoglobin: 8 g/dL — ABNORMAL LOW (ref 12.0–15.0)
MCH: 26.3 pg (ref 26.0–34.0)
MCHC: 31.9 g/dL (ref 30.0–36.0)
MCV: 82.6 fL (ref 78.0–100.0)
PLATELETS: 204 10*3/uL (ref 150–400)
RBC: 3.04 MIL/uL — ABNORMAL LOW (ref 3.87–5.11)
RDW: 15.5 % (ref 11.5–15.5)
WBC: 5.7 10*3/uL (ref 4.0–10.5)

## 2016-02-07 NOTE — Progress Notes (Signed)
[facility] Progress Note  Patient: Doris Pacheco AVW:[id number]   PCP: Ralene OkMOREIRA,ROY, MD DOB: 02/24/1941   DOA: 02/02/2016   DOS: 02/07/2016   Date of Service: the patient was seen and examined on 02/07/2016  Subjective: Patient denies any acute complaint. No nausea no vomiting. Pain has been moderately controlled. No chest pain or abdominal pain. No diarrhea. Continues to have constipation. Nutrition: tolerating oral diet  Brief hospital course: Patient was admitted on 02/02/2016, with complaint of mechanic fall, was found to have right femur fracture. S/p ORIF May 17, pain has been not well controlled Currently further plan is monitor for pain improvement and constipation resolution.  Assessment and Plan: Fracture of femur, distal, right, closed / Osteoporosis -Patient with mechanical fall and subsequent distal femur fracture Patient seen by Dr.Rowan, status post open reduction internal fixation 5/17 Vitamin D levels okay -Continue preadmission calcium with vitamin D and Forteo PT OT recommended SNF Per orthopedics aspirin 325 mg twice a day 2 weeks for DVT prophylaxis -Patient on estrogen based hormone replacement prior to admission, this will be held until patient is ambulatory, therefore estrogen has been discontinued -Preoperative EKG and chest x-ray unremarkable  Acute kidney injury, creatinine has increased from 0.86-1.18, blood pressure has been soft, therefore discontinued Diovan and started IV fluids, creatinine improved,    total knee arthroplasty - right /Arthritis -Patient with recent issues of tightness and pain above-knee and has recently been started on Voltaren gel and gabapentin Continue Percocet for pain control,   Hypertension/Left ventricular diastolic dysfunction  hold Maxzide and Diovan Continue atenolol and hydralazine dose has been increased to 25 mg 3 times a day   History of CVA (cerebrovascular accident) -Continue Livalo and lovastatin    Anxiety -Recently started on Ativan tabs   GERD (gastroesophageal reflux disease) -Therapeutic substitution for Nexium   Anemia -Hgb has trended down slowly 11.8>8 Continue to follow, monitor and transfuse for hemoglobin less than 7. No active bleeding reported.  Pain management: increased to percocet 1-2 tablet, q6. Activity: SNF per physical therapy Bowel regimen: last BM 02/07/2016, stool softner added Diet: cardiac diet DVT Prophylaxis: subcutaneous Heparin  Advance goals of care discussion: DNR  Family Communication: no family was present at bedside, at the time of interview.   Disposition:  Discharge to SNF, pending hemoglobin stabilization and pain control Expected discharge date: 02/08/2016  Consultants: orthopedic Procedures: ORIF  Antibiotics: Anti-infectives    Start     Dose/Rate Route Frequency Ordered Stop   02/03/16 0600  ceFAZolin (ANCEF) IVPB 2g/100 mL premix     2 g 200 mL/hr over 30 Minutes Intravenous On call to O.R. 02/02/16 1803 02/03/16 1433        Intake/Output Summary (Last 24 hours) at 02/07/16 1544 Last data filed at 02/07/16 1400  Gross per 24 hour  Intake    950 ml  Output    850 ml  Net    100 ml   There were no vitals filed for this visit.  Objective: Physical Exam: Filed Vitals:   02/06/16 1752 02/06/16 2100 02/07/16 0450 02/07/16 1300  BP: 116/57 119/86 123/40 138/64  Pulse:  79 71 70  Temp:  98.3 F (36.8 C) 98.4 F (36.9 C) 97.8 F (36.6 C)  TempSrc:  Oral Oral Oral  Resp:   16 16  SpO2:  99% 100% 100%    General: Alert, Awake and Oriented to Time, Place and Person. Appear in mild distress Eyes: PERRL, Conjunctiva normal ENT: Oral Mucosa clear moist.  Neck: no JVD, no Abnormal Mass Or lumps Cardiovascular: S1 and S2 Present, no Murmur, Peripheral Pulses Present Respiratory: Bilateral Air entry equal and Decreased, Clear to Auscultation, no Crackles, no wheezes Abdomen: Bowel Sound present, Soft and no  tenderness Skin: no redness, no Rash  Extremities: no Pedal edema, no calf tenderness Neurologic: Grossly no focal neuro deficit. Bilaterally Equal motor strength  Data Reviewed: CBC:  Recent Labs Lab 02/02/16 1300 02/03/16 0544 02/04/16 0705 02/05/16 0809 02/07/16 0430  WBC 6.5 7.0 6.6 6.5 5.7  NEUTROABS 5.3  --   --   --   --   HGB 11.8* 10.2* 9.1* 8.7* 8.0*  HCT 36.3 31.0* 28.1* 27.4* 25.1*  MCV 82.9 83.6 84.4 83.3 82.6  PLT 231 187 165 176 204   Basic Metabolic Panel:  Recent Labs Lab 02/02/16 1300 02/02/16 1448 02/03/16 0544 02/04/16 0705 02/04/16 1152 02/05/16 0809  NA 136  --  139 137  --  138  K 3.4*  --  4.1 3.4*  --  3.7  CL 102  --  105 105  --  108  CO2 23  --  23 21*  --  22  GLUCOSE 103*  --  99 120*  --  116*  BUN 18  --  16 13  --  11  CREATININE 0.79  --  0.86 1.18*  --  0.74  CALCIUM 9.0 8.9 8.4* 7.6*  --  8.0*  MG  --   --   --   --  1.9  --     Liver Function Tests:  Recent Labs Lab 02/02/16 1448 02/04/16 0705 02/05/16 0809  AST  --  14* 11*  ALT  --  9* 9*  ALKPHOS  --  24* 35*  BILITOT  --  0.5 0.5  PROT  --  5.2* 5.2*  ALBUMIN 3.6 2.5* 2.4*   No results for input(s): LIPASE, AMYLASE in the last 168 hours. No results for input(s): AMMONIA in the last 168 hours. Coagulation Profile:  Recent Labs Lab 02/02/16 1448  INR 1.04   Cardiac Enzymes: No results for input(s): CKTOTAL, CKMB, CKMBINDEX, TROPONINI in the last 168 hours. BNP (last 3 results) No results for input(s): PROBNP in the last 8760 hours.  CBG: No results for input(s): GLUCAP in the last 168 hours.  Studies: No results found.   Scheduled Meds: . aspirin EC  325 mg Oral BID WC  . atenolol  25 mg Oral Daily  . ferrous sulfate  325 mg Oral TID PC  . gabapentin  300 mg Oral TID  . hydrALAZINE  10 mg Oral Q6H  . LORazepam  0.5 mg Oral Q8H  . montelukast  10 mg Oral QHS  . pantoprazole  40 mg Oral Daily  . polyethylene glycol  17 g Oral BID  . pravastatin   40 mg Oral q1800  . senna-docusate  1 tablet Oral BID  . vitamin C  500 mg Oral Daily   Continuous Infusions:  PRN Meds: acetaminophen **OR** acetaminophen, bisacodyl, hydrALAZINE, menthol-cetylpyridinium **OR** phenol, methocarbamol **OR** methocarbamol (ROBAXIN)  IV, metoCLOPramide **OR** metoCLOPramide (REGLAN) injection, morphine injection, ondansetron (ZOFRAN) IV, oxyCODONE-acetaminophen  Time spent: 30 minutes  Author: Lynden Oxford, MD [facility] Pager: [phone] 02/07/2016 3:44 PM  If 7PM-7AM, please contact night-coverage at [email or web address], password [facility]

## 2016-02-07 NOTE — Progress Notes (Signed)
Subjective: 4 Days Post-Op Procedure(s) (LRB): OPEN REDUCTION INTERNAL FIXATION (ORIF) DISTAL FEMUR FRACTURE (Right)   Patient on bedside commode this morning. She was able to have a BM. We assisted her back to bed. She had no major complaints of pain.   Activity level:  40lb weightbearing right leg.  Diet tolerance:  ok Voiding:  ok Patient reports pain as mild.    Objective: Vital signs in last 24 hours: Temp:  [98.1 F (36.7 C)-98.4 F (36.9 C)] 98.4 F (36.9 C) (05/21 0450) Pulse Rate:  [71-79] 71 (05/21 0450) Resp:  [16-18] 16 (05/21 0450) BP: (116-148)/(40-86) 123/40 mmHg (05/21 0450) SpO2:  [99 %-100 %] 100 % (05/21 0450)  Labs:  Recent Labs  02/05/16 0809 02/07/16 0430  HGB 8.7* 8.0*    Recent Labs  02/05/16 0809 02/07/16 0430  WBC 6.5 5.7  RBC 3.29* 3.04*  HCT 27.4* 25.1*  PLT 176 204    Recent Labs  02/05/16 0809  NA 138  K 3.7  CL 108  CO2 22  BUN 11  CREATININE 0.74  GLUCOSE 116*  CALCIUM 8.0*   No results for input(s): LABPT, INR in the last 72 hours.  Physical Exam:  Neurologically intact ABD soft Neurovascular intact Sensation intact distally Intact pulses distally Dorsiflexion/Plantar flexion intact Incision: dressing C/D/I and no drainage No cellulitis present Compartment soft  Assessment/Plan:  4 Days Post-Op Procedure(s) (LRB): OPEN REDUCTION INTERNAL FIXATION (ORIF) DISTAL FEMUR FRACTURE (Right) Advance diet Up with therapy Discharge to SNF once insurance approves and she is cleared by medicine team. PT/OT 40 lbs weight bearing to right leg DVT Prophylaxis: SCDx72hrs, ASA 325 mg BID x 2 weeks DISCHARGE NEEDS: HHPT, Walker and 3-in-1 comode seat  Neave Lenger, Ginger OrganNDREW PAUL 02/07/2016, 9:28 AM

## 2016-02-07 NOTE — Progress Notes (Signed)
Patient having trouble having a BM she feels as though it is "stuck".  Suppository administered (see MAR).  Prune juice also given.  Will continue to monitor.

## 2016-02-08 DIAGNOSIS — I10 Essential (primary) hypertension: Secondary | ICD-10-CM

## 2016-02-08 DIAGNOSIS — N179 Acute kidney failure, unspecified: Secondary | ICD-10-CM

## 2016-02-08 DIAGNOSIS — S72471S Torus fracture of lower end of right femur, sequela: Secondary | ICD-10-CM

## 2016-02-08 DIAGNOSIS — D62 Acute posthemorrhagic anemia: Secondary | ICD-10-CM

## 2016-02-08 LAB — CBC
HCT: 25.2 % — ABNORMAL LOW (ref 36.0–46.0)
Hemoglobin: 7.8 g/dL — ABNORMAL LOW (ref 12.0–15.0)
MCH: 25.5 pg — AB (ref 26.0–34.0)
MCHC: 31 g/dL (ref 30.0–36.0)
MCV: 82.4 fL (ref 78.0–100.0)
PLATELETS: 229 10*3/uL (ref 150–400)
RBC: 3.06 MIL/uL — AB (ref 3.87–5.11)
RDW: 15.3 % (ref 11.5–15.5)
WBC: 6.3 10*3/uL (ref 4.0–10.5)

## 2016-02-08 LAB — CBC AND DIFFERENTIAL: WBC: 6.3 10^3/mL

## 2016-02-08 LAB — PREPARE RBC (CROSSMATCH)

## 2016-02-08 MED ORDER — SODIUM CHLORIDE 0.9 % IV SOLN: Freq: Once | INTRAVENOUS | Status: DC

## 2016-02-08 NOTE — Discharge Instructions (Signed)
Hip Fracture A hip fracture is a fracture of the upper part of your thigh bone (femur).  CAUSES A hip fracture is caused by a direct blow to the side of your hip. This is usually the result of a fall but can occur in other circumstances, such as an automobile accident. RISK FACTORS There is an increased risk of hip fractures in people with:  An unsteady walking pattern (gait) and those with conditions that contribute to poor balance, such as Parkinson's disease or dementia.  Osteopenia and osteoporosis.  Cancer that spreads to the leg bones.  Certain metabolic diseases. SYMPTOMS  Symptoms of hip fracture include:  Pain over the injured hip.  Inability to put weight on the leg in which the fracture occurred (although, some patients are able to walk after a hip fracture).  Toes and foot of the affected leg point outward when you lie down. DIAGNOSIS A physical exam can determine if a hip fracture is likely to have occurred. X-ray exams are needed to confirm the fracture and to look for other injuries. The X-ray exam can help to determine the type of hip fracture. Rarely, the fracture is not visible on an X-ray image and a CT scan or MRI will have to be done. TREATMENT  The treatment for a fracture is usually surgery. This means using a screw, nail, or rod to hold the bones in place.  HOME CARE INSTRUCTIONS Take all medicines as directed by your health care provider. SEEK MEDICAL CARE IF: Pain continues, even after taking pain medicine. MAKE SURE YOU:  Understand these instructions.   Will watch your condition.  Will get help right away if you are not doing well or get worse.   This information is not intended to replace advice given to you by your health care provider. Make sure you discuss any questions you have with your health care provider.   Document Released: 09/05/2005 Document Revised: 09/10/2013 Document Reviewed: 04/17/2013 Elsevier Interactive Patient Education 2016  Elsevier Inc.  

## 2016-02-08 NOTE — Progress Notes (Signed)
[facility] returned call to receive report on this pt being admitted to their services today. [facility]

## 2016-02-08 NOTE — Clinical Social Work Note (Signed)
[facility] to be [facility].  Patient and patient's son updated at bedside.  Patient to be transported via patient's son.  [facility].   RN report number: [phone]  Marcelline Deistmily Carlyn Lemke, LCSW [phone] Orthopedics: [phone] Surgical: [phone]

## 2016-02-08 NOTE — Progress Notes (Signed)
Physical Therapy Treatment Patient Details Name: Doris Pacheco MRN: [id number] DOB: [birth year] Today's Date: 02/08/2016    History of Present Illness pt presents after a fall at home sustaining a R Distal femur fx just superior to R TKR.  pt now s/p ORIF.  pt with hx of R TKR, L THR, Bil Rotator Cuff Repairs, Anxiety, HTN, TIA, and R knee I+D with Patellectomy.      PT Comments    Pt performed increased activity and mobility.  Pt limited progress due to bouts of dizziness.  Pt currently with low HGB, informed RN and MD of dizziness during tx.  Will continue to follow patient during acute hospitalization.     Follow Up Recommendations  SNF     Equipment Recommendations  None recommended by PT    Recommendations for Other Services       Precautions / Restrictions Precautions Precautions: Fall Required Braces or Orthoses: Knee Immobilizer - Right Knee Immobilizer - Right: Other (comment) (order states wear prn but not in bed.  ) Restrictions Weight Bearing Restrictions: Yes RLE Weight Bearing: Partial weight bearing RLE Partial Weight Bearing Percentage or Pounds:  (40 #)    Mobility  Bed Mobility Overal bed mobility: Needs Assistance Bed Mobility: Supine to Sit     Supine to sit: HOB elevated;Supervision     General bed mobility comments: Cues for hand placement and advancement of BLEs.  Pt required increased time to complete task but remains highly motivated to do for herself.    Transfers Overall transfer level: Needs assistance Equipment used: Rolling walker (2 wheeled) Transfers: Sit to/from Stand Sit to Stand: Mod assist Stand pivot transfers: Min assist;+2 safety/equipment       General transfer comment: Pt required cues for hand placement and maintenance of RLE weight bearing.  Pt presents with better carryover to maintain weight bearing restriction.    Ambulation/Gait Ambulation/Gait assistance: Min assist;+2 safety/equipment Ambulation Distance (Feet):  14 Feet Assistive device: Rolling walker (2 wheeled) Gait Pattern/deviations: Step-to pattern;Antalgic;Trunk flexed     General Gait Details: Pt able to follow commands to use UEs during R stance phase to off set weight and maintain restriction.  Pt limited gait distance due to dizziness.     Stairs            Wheelchair Mobility    Modified Rankin (Stroke Patients Only)       Balance Overall balance assessment: Needs assistance   Sitting balance-Leahy Scale: Fair       Standing balance-Leahy Scale: Poor                      Cognition Arousal/Alertness: Awake/alert Behavior During Therapy: WFL for tasks assessed/performed Overall Cognitive Status: Within Functional Limits for tasks assessed                      Exercises Total Joint Exercises Ankle Circles/Pumps: AROM;Both;10 reps Quad Sets: AROM;Both;10 reps Heel Slides: AROM;Left;10 reps Hip ABduction/ADduction: AAROM;10 reps;Both;AROM Straight Leg Raises: AROM;AAROM;Both;10 reps    General Comments        Pertinent Vitals/Pain Pain Assessment: Faces Pain Score: 8  Pain Location: RLE. Pain Descriptors / Indicators: Grimacing;Aching;Guarding Pain Intervention(s): Limited activity within patient's tolerance;Repositioned    Home Living                      Prior Function            PT Goals (current goals can  now be found in the care plan section) Acute Rehab PT Goals Patient Stated Goal: Go to rehab to walk again Potential to Achieve Goals: Good Progress towards PT goals: Progressing toward goals    Frequency  Min 5X/week    PT Plan Current plan remains appropriate    Co-evaluation             End of Session Equipment Utilized During Treatment: Gait belt Activity Tolerance: Patient limited by fatigue (& dizziness) Patient left: in chair;with call bell/phone within reach;with chair alarm set;with family/visitor present     Time: [id number] PT Time  Calculation (min) (ACUTE ONLY): 25 min  Charges:  $Gait Training: 8-22 mins $Therapeutic Exercise: 8-22 mins                    G Codes:      Florestine Aversimee J Latanga Nedrow 02/08/2016, 9:27 AM  Joycelyn RuaAimee Odessa Nishi, PTA pager [phone]

## 2016-02-08 NOTE — Progress Notes (Signed)
Attempted to call report x 3 to [facility]. Was transferred x2 then disconnected and hung up on the third time. Gave the operator number to [facility] and asked to have the nurse receiving this patient to contact the sending nurse for report.  Family here to transport pt to [facility]. All belongings with pt. No distress noted. Pt rec'd 1 unit PRBC's prior to discharge and tolerated well.  This scriber is available for questions and report.

## 2016-02-08 NOTE — Clinical Social Work Placement (Signed)
   CLINICAL SOCIAL WORK PLACEMENT  NOTE  Date:  02/08/2016  Patient Details  Name: Tiajuana Amassnita L Rorabaugh MRN: [id number] Date of Birth: [birth year]  Clinical Social Work is seeking post-discharge placement for this patient at the Skilled  Nursing Facility level of care (*CSW will initial, date and re-position this form in  chart as items are completed):  Yes   Patient/family provided with [place] Clinical Social Work Department's list of facilities offering this level of care within the geographic area requested by the patient (or if unable, by the patient's family).  Yes   Patient/family informed of their freedom to choose among providers that offer the needed level of care, that participate in Medicare, Medicaid or managed care program needed by the patient, have an available bed and are willing to accept the patient.  Yes   Patient/family informed of [facility]'s ownership interest in [facility] and [facility], as well as of the fact that they are under no obligation to receive care at these facilities.  PASRR submitted to EDS on       PASRR number received on       Existing PASRR number confirmed on 02/05/16     FL2 transmitted to all facilities in geographic area requested by pt/family on 02/05/16     FL2 transmitted to all facilities within larger geographic area on       Patient informed that his/her managed care company has contracts with or will negotiate with certain facilities, including the following:        Yes   Patient/family informed of bed offers received.  Patient chooses bed at [facility]     Physician recommends and patient chooses bed at      [facility] to be [facility] on 02/08/16.  Patient to be transferred to facility by CAR     Patient family notified on 02/08/16 of transfer.  Name of family member notified:  Son [place]     PHYSICIAN       Additional Comment:    _______________________________________________ Rod MaeVaughn,  Analynn Daum S, LCSW 02/08/2016, 11:38 AM

## 2016-02-08 NOTE — Discharge Summary (Signed)
Physician Discharge Summary  Doris Pacheco WJX:[id number] DOB: 23-Mar-1941 DOA: 02/02/2016  PCP: Ralene Ok, MD  Admit date: 02/02/2016 Discharge date: 02/08/2016  Recommendations for Outpatient Follow-up:  Continue aspirin on discharge for DVT prophylaxis  Please note patient is receiving 1 unit of PRBC transfusion prior to discharge Please recheck CBC in 3-4 days after discharge to make sure that hemoglobin is stable  Discharge Diagnoses:  Principal Problem:   Fracture of femur, distal, right, closed (HCC) Active Problems:   Loose total knee arthroplasty - right    Hypertension   Anxiety   History of CVA (cerebrovascular accident)   GERD (gastroesophageal reflux disease)   Arthritis   Osteoporosis   Left ventricular diastolic dysfunction   Anemia   Femoral distal fracture, right, closed, initial encounter   Abrasion, knee   Closed fracture of right distal femur (HCC)   Discharge Condition: stable   Diet recommendation: as tolerated   History of present illness:  75 year old female with past medical history of osteoporosis, hypertension, total right knee arthroplasty. Patient presented to [facility] 02/02/2016 status post mechanical fall. She sustained right femur fracture. Patient is status post ORIF 02/03/2016.  Hospital Course:   Fracture of femur, distal, right, closed /Mechanical fall / Osteoporosis - Patient with mechanical fall and subsequent distal femur fracture - Status post open reduction internal fixation 02/02/18 by Dr.Rowan - Per PT eval - SNF placement  - Per orthopedics aspirin 325 mg twice a day 2 weeks for DVT prophylaxis - Patient on estrogen based hormone replacement prior to admission, this will be held until patient is ambulatory, therefore estrogen has been discontinued  Acute postoperative blood loss anemia - Hemoglobin 7.8 this am - Will give 1 U PRBC transfusion today prior to discharge   Acute kidney injury - Likely from Diovan which is on  hold - Blood pressure 124/71  Total right knee arthroplasty  - Continue pain management efforts  Essential hypertension - Diovan on hold and using atenolol and hydralazine  History of CVA (cerebrovascular accident) -Continue Livalo and lovastatin  Anxiety - Ativan as needed   DVT Prophylaxis: subcutaneous Heparin Advance goals of care discussion: DNR/DNI Family Communication: no family was present at bedside, at the time of interview.    Consultants: - Orthopedic surgery  Procedures - ORIF   Signed:  Manson Passey, MD  [facility] 02/08/2016, 1:01 PM  Pager #: [phone]  Time spent in minutes: more than 30 minutes    Discharge Exam: Filed Vitals:   02/08/16 0516 02/08/16 1211  BP: 124/44 124/71  Pulse: 70 65  Temp: 98.8 F (37.1 C) 98.5 F (36.9 C)  Resp: 16 15   Filed Vitals:   02/07/16 1716 02/07/16 2012 02/08/16 0516 02/08/16 1211  BP: 142/60 111/44 124/44 124/71  Pulse: 74 67 70 65  Temp:  98.9 F (37.2 C) 98.8 F (37.1 C) 98.5 F (36.9 C)  TempSrc:  Oral Oral Oral  Resp:  16 16 15   SpO2:  97% 99% 100%    General: Pt is alert, follows commands appropriately, not in acute distress Cardiovascular: Regular rate and rhythm, S1/S2 + Respiratory: Clear to auscultation bilaterally, no wheezing, no crackles, no rhonchi Abdominal: Soft, non tender, non distended, bowel sounds +, no guarding Extremities: RLE edema more than LLE, no cyanosis, pulses palpable bilaterally DP and PT Neuro: Grossly nonfocal  Discharge Instructions  Discharge Instructions    Call MD for:  difficulty breathing, headache or visual disturbances    Complete by:  As  directed      Call MD for:  persistant dizziness or light-headedness    Complete by:  As directed      Call MD for:  persistant nausea and vomiting    Complete by:  As directed      Call MD for:  severe uncontrolled pain    Complete by:  As directed      Diet - low sodium heart healthy    Complete  by:  As directed      Diet - low sodium heart healthy    Complete by:  As directed      Increase activity slowly    Complete by:  As directed      Increase activity slowly    Complete by:  As directed      Partial weight bearing    Complete by:  As directed   % Body Weight:  40 lbs  Laterality:  right  Extremity:  Lower            Medication List    STOP taking these medications        estrogens (conjugated) 1.25 MG tablet  Commonly known as:  PREMARIN     HYDROcodone-acetaminophen 10-325 MG tablet  Commonly known as:  NORCO     meloxicam 15 MG tablet  Commonly known as:  MOBIC     traMADol 50 MG tablet  Commonly known as:  ULTRAM     triamterene-hydrochlorothiazide 37.5-25 MG tablet  Commonly known as:  MAXZIDE-25      TAKE these medications        acetaminophen 325 MG tablet  Commonly known as:  TYLENOL  Take 2 tablets (650 mg total) by mouth every 6 (six) hours as needed for mild pain (or Fever >/= 101).     aspirin EC 325 MG tablet  Take 1 tablet (325 mg total) by mouth 2 (two) times daily.     atenolol 25 MG tablet  Commonly known as:  TENORMIN  Take 25 mg by mouth daily.     bisacodyl 10 MG suppository  Commonly known as:  DULCOLAX  Place 1 suppository (10 mg total) rectally daily as needed for moderate constipation.     CALCIUM-VITAMIN D PO  Take 1,200 mg by mouth daily.     docusate sodium 100 MG capsule  Commonly known as:  COLACE  Take 1 capsule (100 mg total) by mouth 2 (two) times daily.     esomeprazole 40 MG capsule  Commonly known as:  NEXIUM  Take 40 mg by mouth daily before breakfast.     ferrous sulfate 325 (65 FE) MG tablet  Take 1 tablet (325 mg total) by mouth 3 (three) times daily after meals.     FORTEO [place]  Inject into the skin. Reported on 02/02/2016     gabapentin 300 MG capsule  Commonly known as:  NEURONTIN  Take 300 mg by mouth every other day.     hydrALAZINE 25 MG tablet  Commonly known as:  APRESOLINE  Take 1  tablet (25 mg total) by mouth every 6 (six) hours.     Insulin Pen Needle 32G X 4 MM Misc  Pt to use once a day for with her Forteo pen     LIVALO 2 MG Tabs  Generic drug:  Pitavastatin Calcium  Take 2 mg by mouth daily.     LORazepam 0.5 MG tablet  Commonly known as:  ATIVAN  Take 0.5 mg by mouth at  bedtime.     methocarbamol 500 MG tablet  Commonly known as:  ROBAXIN  Take 1 tablet (500 mg total) by mouth every 6 (six) hours as needed for muscle spasms.     montelukast 10 MG tablet  Commonly known as:  SINGULAIR  Take 10 mg by mouth at bedtime.     oxyCODONE-acetaminophen 5-325 MG tablet  Commonly known as:  ROXICET  Take 1-2 tablets by mouth every 6 (six) hours as needed.     polyethylene glycol packet  Commonly known as:  MIRALAX / GLYCOLAX  Take 17 g by mouth daily as needed for mild constipation.     tizanidine 2 MG capsule  Commonly known as:  ZANAFLEX  Take 1 capsule (2 mg total) by mouth 3 (three) times daily.     URIBEL 118 MG Caps  Take 118 mg by mouth daily.     valsartan 320 MG tablet  Commonly known as:  DIOVAN  Take 320 mg by mouth daily.     vitamin C 500 MG tablet  Commonly known as:  ASCORBIC ACID  Take 500 mg by mouth daily.     VITAMIN D3 PO  Take 1 tablet by mouth daily.     VITAMIN E PO  Take 1 capsule by mouth daily.     VOLTAREN 1 % Gel  Generic drug:  diclofenac sodium  Apply 2 g topically 2 (two) times daily as needed (pain).           Follow-up Information    Follow up with Nestor Lewandowsky, MD In 2 weeks.   Specialty:  Orthopedic Surgery   Contact information:   [address] [place] [address] [phone]       Follow up with Ralene Ok, MD. Schedule an appointment as soon as possible for a visit in 1 week.   Specialty:  Internal Medicine   Why:  Follow up appt after recent hospitalization   Contact information:   [address] [facility] [address] [phone]        The results of significant diagnostics  from this hospitalization (including imaging, microbiology, ancillary and laboratory) are listed below for reference.    Significant Diagnostic Studies: Dg Chest 2 View  02/02/2016  CLINICAL DATA:  Preop evaluation for broken right femur EXAM: CHEST  2 VIEW COMPARISON:  07/25/2015 FINDINGS: The heart size and mediastinal contours are within normal limits. Both lungs are clear. The visualized skeletal structures shows stable degenerative changes of the thoracic spine with increase kyphosis. IMPRESSION: No active cardiopulmonary disease. Electronically Signed   By: Alcide Clever M.D.   On: 02/02/2016 13:25   Dg Knee 1-2 Views Right  02/03/2016  CLINICAL DATA:  Open reduction internal fixation for distal femur fracture EXAM: DG C-ARM 61-120 MIN; RIGHT KNEE - 1-2 VIEW COMPARISON:  Feb 02, 2016 FLUOROSCOPY TIME:  1 minutes 49 seconds; 1 acquired image FINDINGS: Frontal image shows screw and plate fixation through a fracture of the distal femur with alignment near anatomic on this single frontal view. Total knee replacement is noted with the visualized femoral and tibial prosthetic components well-seated. No dislocation evident. IMPRESSION: Screw and rod fixation through distal femur fracture with alignment near anatomic on frontal view. Total knee replacement prostheses appear well seated on frontal view. Electronically Signed   By: Bretta Bang III M.D.   On: 02/03/2016 16:22   Dg Knee Complete 4 Views Right  02/02/2016  CLINICAL DATA:  Tripped and fell on concrete this morning, EXAM:  RIGHT KNEE - COMPLETE 4+ VIEW COMPARISON:  None. FINDINGS: Four views of the right knee submitted. There is right knee prosthesis with anatomic alignment. Mild displaced minimal angulated comminuted fracture in distal right femoral metaphysis above the level of prosthesis. IMPRESSION: There is right knee prosthesis with anatomic alignment. Mild displaced minimal angulated comminuted fracture in distal right femoral metaphysis  above the level of prosthesis. Electronically Signed   By: Natasha Mead M.D.   On: 02/02/2016 12:22   Dg C-arm 1-60 Min  02/03/2016  CLINICAL DATA:  Open reduction internal fixation for distal femur fracture EXAM: DG C-ARM 61-120 MIN; RIGHT KNEE - 1-2 VIEW COMPARISON:  Feb 02, 2016 FLUOROSCOPY TIME:  1 minutes 49 seconds; 1 acquired image FINDINGS: Frontal image shows screw and plate fixation through a fracture of the distal femur with alignment near anatomic on this single frontal view. Total knee replacement is noted with the visualized femoral and tibial prosthetic components well-seated. No dislocation evident. IMPRESSION: Screw and rod fixation through distal femur fracture with alignment near anatomic on frontal view. Total knee replacement prostheses appear well seated on frontal view. Electronically Signed   By: Bretta Bang III M.D.   On: 02/03/2016 16:22    Microbiology: Recent Results (from the past 240 hour(s))  Surgical pcr screen     Status: None   Collection Time: 02/03/16  3:47 AM  Result Value Ref Range Status   MRSA, PCR NEGATIVE NEGATIVE Final   Staphylococcus aureus NEGATIVE NEGATIVE Final    Comment:        The Xpert SA Assay (FDA approved for NASAL specimens in patients over 22 years of age), is one component of a comprehensive surveillance program.  Test performance has been validated by [facility] for patients greater than or equal to 55 year old. It is not intended to diagnose infection nor to guide or monitor treatment.      Labs: Basic Metabolic Panel:  Recent Labs Lab 02/02/16 1300 02/02/16 1448 02/03/16 0544 02/04/16 0705 02/04/16 1152 02/05/16 0809  NA 136  --  139 137  --  138  K 3.4*  --  4.1 3.4*  --  3.7  CL 102  --  105 105  --  108  CO2 23  --  23 21*  --  22  GLUCOSE 103*  --  99 120*  --  116*  BUN 18  --  16 13  --  11  CREATININE 0.79  --  0.86 1.18*  --  0.74  CALCIUM 9.0 8.9 8.4* 7.6*  --  8.0*  MG  --   --   --   --  1.9   --    Liver Function Tests:  Recent Labs Lab 02/02/16 1448 02/04/16 0705 02/05/16 0809  AST  --  14* 11*  ALT  --  9* 9*  ALKPHOS  --  24* 35*  BILITOT  --  0.5 0.5  PROT  --  5.2* 5.2*  ALBUMIN 3.6 2.5* 2.4*   No results for input(s): LIPASE, AMYLASE in the last 168 hours. No results for input(s): AMMONIA in the last 168 hours. CBC:  Recent Labs Lab 02/02/16 1300 02/03/16 0544 02/04/16 0705 02/05/16 0809 02/07/16 0430 02/08/16 0300  WBC 6.5 7.0 6.6 6.5 5.7 6.3  NEUTROABS 5.3  --   --   --   --   --   HGB 11.8* 10.2* 9.1* 8.7* 8.0* 7.8*  HCT 36.3 31.0* 28.1* 27.4* 25.1* 25.2*  MCV 82.9 83.6 84.4 83.3 82.6 82.4  PLT 231 187 165 176 204 229   Cardiac Enzymes: No results for input(s): CKTOTAL, CKMB, CKMBINDEX, TROPONINI in the last 168 hours. BNP: BNP (last 3 results)  Recent Labs  07/25/15 1206  BNP 37.0    ProBNP (last 3 results) No results for input(s): PROBNP in the last 8760 hours.  CBG: No results for input(s): GLUCAP in the last 168 hours.

## 2016-02-09 LAB — TYPE AND SCREEN
ABO/RH(D): O POS
Antibody Screen: NEGATIVE
UNIT DIVISION: 0

## 2016-02-09 NOTE — Care Management Important Message (Signed)
Important Message  Patient Details  Name: Tiajuana Amassnita L Vickrey MRN: [id number] Date of Birth: [birth year]   Medicare Important Message Given:  Yes    Bernadette HoitShoffner, Abdiaziz Klahn Coleman 02/09/2016, 1:39 PM

## 2016-02-10 ENCOUNTER — Encounter: Payer: Self-pay | Admitting: Internal Medicine

## 2016-02-10 ENCOUNTER — Non-Acute Institutional Stay (SKILLED_NURSING_FACILITY): Payer: Medicare HMO | Admitting: Internal Medicine

## 2016-02-10 DIAGNOSIS — R2681 Unsteadiness on feet: Secondary | ICD-10-CM

## 2016-02-10 DIAGNOSIS — M792 Neuralgia and neuritis, unspecified: Secondary | ICD-10-CM | POA: Diagnosis not present

## 2016-02-10 DIAGNOSIS — K5900 Constipation, unspecified: Secondary | ICD-10-CM

## 2016-02-10 DIAGNOSIS — F419 Anxiety disorder, unspecified: Secondary | ICD-10-CM

## 2016-02-10 DIAGNOSIS — I10 Essential (primary) hypertension: Secondary | ICD-10-CM

## 2016-02-10 DIAGNOSIS — M810 Age-related osteoporosis without current pathological fracture: Secondary | ICD-10-CM | POA: Diagnosis not present

## 2016-02-10 DIAGNOSIS — K219 Gastro-esophageal reflux disease without esophagitis: Secondary | ICD-10-CM

## 2016-02-10 DIAGNOSIS — D62 Acute posthemorrhagic anemia: Secondary | ICD-10-CM | POA: Diagnosis not present

## 2016-02-10 DIAGNOSIS — S72471S Torus fracture of lower end of right femur, sequela: Secondary | ICD-10-CM | POA: Diagnosis not present

## 2016-02-10 NOTE — Progress Notes (Signed)
LOCATION: [facility]  PCP: Ralene Ok, MD   Code Status: Full Code  Goals of care: Advanced Directive information Advanced Directives 07/25/2015  Does patient have an advance directive? Yes  Type of Advance Directive -  Copy of advanced directive(s) in chart? -  Would patient like information on creating an advanced directive? -  Pre-existing out of facility DNR order (yellow form or pink MOST form) -       Extended Emergency Contact Information Primary Emergency Contact: Maione,Richard          [place] [address] [employer] Phone: [phone] Relation: Son Secondary Emergency Contact: Fidel Levy [place] Mobile Phone: [phone] Relation: Daughter   Allergies  Allergen Reactions  . Forteo [Teriparatide (Recombinant)] Shortness Of Breath  . Tape Rash    Paper tape OK    Chief Complaint  Patient presents with  . New Admit To SNF    New Admission      HPI:  Patient is a 75 y.o. female seen today for short term rehabilitation post hospital admission from 02/02/16-02/08/16 post fall with right femoral distal fracture. She underwent ORIF on 02/03/16. She had acute blood loss anemia and required 1 u prbc transfusion. She is seen in her room today.   Review of Systems:  Constitutional: Negative for fever, chills, malaise and diaphoresis.  HENT: Negative for headache, congestion, nasal discharge, difficulty swallowing.   Eyes: Negative for blurred vision, double vision and discharge.  Respiratory: Negative for cough, shortness of breath and wheezing.   Cardiovascular: Negative for chest pain, palpitations, leg swelling.  Gastrointestinal: Negative for heartburn, nausea, vomiting, abdominal pain. Positive for poor appetite. Last bowel movement was today  Genitourinary: Negative for dysuria and flank pain.  Musculoskeletal: Negative for back pain, fall in the facility.  Skin: Negative for itching, rash.  Neurological: Negative  for dizziness. Psychiatric/Behavioral: Negative for depression   Past Medical History  Diagnosis Date  . Hypertension     takes meds daily  . Anxiety   . History of seasonal allergies   . Stroke (HCC)     tia  . GERD (gastroesophageal reflux disease)   . Arthritis    Past Surgical History  Procedure Laterality Date  . Joint replacement      left hip, right knee  . Rotator cuff repair      bilateral  . Tonsillectomy    . Abdominal hysterectomy    . Intraocular lens insertion      right eye  . Patellectomy Right 10/26/2012    Procedure: PATELLECTOMY;  Surgeon: Nestor Lewandowsky, MD;  Location: [facility] OR;  Service: Orthopedics;  Laterality: Right;  . I&d knee with poly exchange Right 10/26/2012    Procedure: IRRIGATION AND DEBRIDEMENT KNEE WITH POLY EXCHANGE;  Surgeon: Nestor Lewandowsky, MD;  Location: [facility] OR;  Service: Orthopedics;  Laterality: Right;  poly exchange  . Orif femur fracture Right 02/03/2016    Procedure: OPEN REDUCTION INTERNAL FIXATION (ORIF) DISTAL FEMUR FRACTURE;  Surgeon: Gean Birchwood, MD;  Location: [facility] OR;  Service: Orthopedics;  Laterality: Right;   Social History:   reports that she has quit smoking. She has never used smokeless tobacco. She reports that she does not drink alcohol or use illicit drugs.  Family History  Problem Relation Age of Onset  . Cancer Mother     COLON  . Diabetes Sister   . Stroke Sister   . Hypertension Father   . Pneumonia Father  Medications:   Medication List       This list is accurate as of: 02/10/16 12:27 PM.  Always use your most recent med list.               acetaminophen 325 MG tablet  Commonly known as:  TYLENOL  Take 2 tablets (650 mg total) by mouth every 6 (six) hours as needed for mild pain (or Fever >/= 101).     aspirin EC 325 MG tablet  Take 1 tablet (325 mg total) by mouth 2 (two) times daily.     atenolol 25 MG tablet  Commonly known as:  TENORMIN  Take 25 mg by mouth daily.     atorvastatin 10 MG  tablet  Commonly known as:  LIPITOR  Take 10 mg by mouth daily.     bisacodyl 10 MG suppository  Commonly known as:  DULCOLAX  Place 1 suppository (10 mg total) rectally daily as needed for moderate constipation.     Calcium Carbonate-Vitamin D 600-400 MG-UNIT tablet  Take 2 tablets by mouth daily.     docusate sodium 100 MG capsule  Commonly known as:  COLACE  Take 1 capsule (100 mg total) by mouth 2 (two) times daily.     ferrous sulfate 325 (65 FE) MG tablet  Take 1 tablet (325 mg total) by mouth 3 (three) times daily after meals.     FORTEO [place]  Inject into the skin. Reported on 02/02/2016     gabapentin 300 MG capsule  Commonly known as:  NEURONTIN  Take 300 mg by mouth every other day.     hydrALAZINE 25 MG tablet  Commonly known as:  APRESOLINE  Take 1 tablet (25 mg total) by mouth every 6 (six) hours.     Insulin Pen Needle 32G X 4 MM Misc  Pt to use once a day for with her Forteo pen     LORazepam 0.5 MG tablet  Commonly known as:  ATIVAN  Take 0.5 mg by mouth at bedtime.     methocarbamol 500 MG tablet  Commonly known as:  ROBAXIN  Take 1 tablet (500 mg total) by mouth every 6 (six) hours as needed for muscle spasms.     montelukast 10 MG tablet  Commonly known as:  SINGULAIR  Take 10 mg by mouth at bedtime.     omeprazole 20 MG capsule  Commonly known as:  PRILOSEC  Take 20 mg by mouth daily.     oxyCODONE-acetaminophen 5-325 MG tablet  Commonly known as:  ROXICET  Take 1-2 tablets by mouth every 6 (six) hours as needed.     polyethylene glycol packet  Commonly known as:  MIRALAX / GLYCOLAX  Take 17 g by mouth daily as needed for mild constipation.     tizanidine 2 MG capsule  Commonly known as:  ZANAFLEX  Take 1 capsule (2 mg total) by mouth 3 (three) times daily.     URIBEL 118 MG Caps  Take 118 mg by mouth daily.     valsartan 320 MG tablet  Commonly known as:  DIOVAN  Take 320 mg by mouth daily.     vitamin C 500 MG tablet  Commonly  known as:  ASCORBIC ACID  Take 500 mg by mouth daily.     VITAMIN D3 PO  Take 1 tablet by mouth daily.     VITAMIN E PO  Take 1 capsule by mouth daily.     VOLTAREN 1 % Gel  Generic drug:  diclofenac sodium  Apply 2 g topically 2 (two) times daily as needed (pain).        Immunizations: Immunization History  Administered Date(s) Administered  . PPD Test 02/08/2016  . Tdap 02/02/2016     Physical Exam: Filed Vitals:   02/10/16 1211  BP: 139/59  Pulse: 62  Temp: 97.4 F (36.3 C)  TempSrc: Oral  Resp: 20  Height: 5\' 4"  (1.626 m)  Weight: 179 lb (81.194 kg)  SpO2: 99%   Body mass index is 30.71 kg/(m^2).  General- elderly female, obese, in no acute distress Head- normocephalic, atraumatic Nose- no maxillary or frontal sinus tenderness, no nasal discharge Throat- moist mucus membrane, missing teeth Eyes- PERRLA, EOMI, no pallor, no icterus, no discharge, normal conjunctiva, normal sclera Neck- no cervical lymphadenopathy Cardiovascular- normal s1,s2, no murmur, trace right leg edema Respiratory- bilateral clear to auscultation, no wheeze, no rhonchi, no crackles, no use of accessory muscles Abdomen- bowel sounds present, soft, non tender Musculoskeletal- able to move all 4 extremities, limited right leg range of motion, arthritis changes to her fingers Neurological- alert and oriented to person, place and time Skin- warm and dry, dressing to surgical incision in knee area clean and dry Psychiatry- normal mood and affect    Labs reviewed: Basic Metabolic Panel:  Recent Labs  40/98/1105/17/17 0544 02/04/16 0705 02/04/16 1152 02/05/16 02/05/16 0809  NA 139 137  --  138 138  K 4.1 3.4*  --   --  3.7  CL 105 105  --   --  108  CO2 23 21*  --   --  22  GLUCOSE 99 120*  --   --  116*  BUN 16 13  --  11 11  CREATININE 0.86 1.18*  --  0.7 0.74  CALCIUM 8.4* 7.6*  --   --  8.0*  MG  --   --  1.9  --   --    Liver Function Tests:  Recent Labs  02/02/16 1448  02/04/16 0705 02/05/16 0809  AST  --  14* 11*  ALT  --  9* 9*  ALKPHOS  --  24* 35*  BILITOT  --  0.5 0.5  PROT  --  5.2* 5.2*  ALBUMIN 3.6 2.5* 2.4*   No results for input(s): LIPASE, AMYLASE in the last 8760 hours. No results for input(s): AMMONIA in the last 8760 hours. CBC:  Recent Labs  02/02/16 1300  02/05/16 0809 02/07/16 0430 02/08/16 02/08/16 0300  WBC 6.5  < > 6.5 5.7 6.3 6.3  NEUTROABS 5.3  --   --   --   --   --   HGB 11.8*  < > 8.7* 8.0*  --  7.8*  HCT 36.3  < > 27.4* 25.1*  --  25.2*  MCV 82.9  < > 83.3 82.6  --  82.4  PLT 231  < > 176 204  --  229  < > = values in this interval not displayed. Cardiac Enzymes: No results for input(s): CKTOTAL, CKMB, CKMBINDEX, TROPONINI in the last 8760 hours. BNP: Invalid input(s): POCBNP CBG: No results for input(s): GLUCAP in the last 8760 hours.  Radiological Exams: Dg Chest 2 View  02/02/2016  CLINICAL DATA:  Preop evaluation for broken right femur EXAM: CHEST  2 VIEW COMPARISON:  07/25/2015 FINDINGS: The heart size and mediastinal contours are within normal limits. Both lungs are clear. The visualized skeletal structures shows stable degenerative changes of the thoracic spine with increase  kyphosis. IMPRESSION: No active cardiopulmonary disease. Electronically Signed   By: Alcide Clever M.D.   On: 02/02/2016 13:25   Dg Knee 1-2 Views Right  02/03/2016  CLINICAL DATA:  Open reduction internal fixation for distal femur fracture EXAM: DG C-ARM 61-120 MIN; RIGHT KNEE - 1-2 VIEW COMPARISON:  Feb 02, 2016 FLUOROSCOPY TIME:  1 minutes 49 seconds; 1 acquired image FINDINGS: Frontal image shows screw and plate fixation through a fracture of the distal femur with alignment near anatomic on this single frontal view. Total knee replacement is noted with the visualized femoral and tibial prosthetic components well-seated. No dislocation evident. IMPRESSION: Screw and rod fixation through distal femur fracture with alignment near anatomic  on frontal view. Total knee replacement prostheses appear well seated on frontal view. Electronically Signed   By: Bretta Bang III M.D.   On: 02/03/2016 16:22   Dg Knee Complete 4 Views Right  02/02/2016  CLINICAL DATA:  Tripped and fell on concrete this morning, EXAM: RIGHT KNEE - COMPLETE 4+ VIEW COMPARISON:  None. FINDINGS: Four views of the right knee submitted. There is right knee prosthesis with anatomic alignment. Mild displaced minimal angulated comminuted fracture in distal right femoral metaphysis above the level of prosthesis. IMPRESSION: There is right knee prosthesis with anatomic alignment. Mild displaced minimal angulated comminuted fracture in distal right femoral metaphysis above the level of prosthesis. Electronically Signed   By: Natasha Mead M.D.   On: 02/02/2016 12:22   Dg C-arm 1-60 Min  02/03/2016  CLINICAL DATA:  Open reduction internal fixation for distal femur fracture EXAM: DG C-ARM 61-120 MIN; RIGHT KNEE - 1-2 VIEW COMPARISON:  Feb 02, 2016 FLUOROSCOPY TIME:  1 minutes 49 seconds; 1 acquired image FINDINGS: Frontal image shows screw and plate fixation through a fracture of the distal femur with alignment near anatomic on this single frontal view. Total knee replacement is noted with the visualized femoral and tibial prosthetic components well-seated. No dislocation evident. IMPRESSION: Screw and rod fixation through distal femur fracture with alignment near anatomic on frontal view. Total knee replacement prostheses appear well seated on frontal view. Electronically Signed   By: Bretta Bang III M.D.   On: 02/03/2016 16:22    Assessment/Plan  Unsteady gait Post fall with fracture, s/p surgical repair, Will have patient work with PT/OT as tolerated to regain strength and restore function.  Fall precautions are in place.  Right femur fracture S/p ORIF. Has f/u with orthopedics. Will have her work with physical therapy and occupational therapy team to help with gait  training and muscle strengthening exercises.fall precautions. Skin care. Encourage to be out of bed. Continue tylenol 650 mg q6h prn pain and roxicet 5-325 mg 1-2 tab q6h prn pain. Continue calcium-vitamin d. Continue zanaflex 2 mg tid and robaxin 500 mg q6h prn muslce spasm Continue aspirin ec 325 mg bid for dvt prophylaxis.   Blood loss anemia Post op, s/p 1 u prbc, low Hb at discharge. Continue ferrous sulfate 325 mg tid. Check cbc in am  HTN Monitor bp, continue atenolol 25 mg daily, hydralazine 25 mg qid and valsartan 320 mg daily. Check bmp  Constipation Stable, continue colace 100 mg bid, prn miralax and prn dulcolax  Neuropathic pain Continue gabapentin  gerd Stable on nexium, no changes made  GAD On ativan 0.5 mg at bedtime, high fall risk, monitor closely  Osteoporosis Fall precautions, continue forteo with calcium and vitamin d supplement   Goals of care: short term rehabilitation   Labs/tests  ordered: cbc, cmp 02/11/16  Family/ staff Communication: reviewed care plan with patient and nursing supervisor    Oneal Grout, MD Internal Medicine [facility] [address] [place], [place] [address] Cell Phone (Monday-Friday 8 am - 5 pm): [phone] On Call: [phone] and follow prompts after 5 pm and on weekends Office Phone: [phone] Office Fax: [phone]

## 2016-02-16 ENCOUNTER — Non-Acute Institutional Stay (SKILLED_NURSING_FACILITY): Payer: Medicare HMO | Admitting: Family

## 2016-02-16 DIAGNOSIS — F419 Anxiety disorder, unspecified: Secondary | ICD-10-CM

## 2016-02-16 DIAGNOSIS — I10 Essential (primary) hypertension: Secondary | ICD-10-CM

## 2016-02-16 NOTE — Progress Notes (Signed)
Patient ID: Doris Pacheco, female   DOB: 01/27/1941, 75 y.o.   MRN: [id number]  Location:  [facility]   Place of Service:  SNF (31) Provider: Geddy Boydstun FNP-C  Oneal Grout, MD   Ralene Ok, MD  Patient Care Team: Ralene Ok, MD as PCP - General (Internal Medicine)  Extended Emergency Contact Information Primary Emergency Contact: Donnald Garre [address] [address] [place] Phone: [phone] Relation: Son Secondary Emergency Contact: Fidel Levy [place] Mobile Phone: [phone] Relation: Daughter  Code Status:  Full Code  Goals of care: Advanced Directive information Advanced Directives 07/25/2015  Does patient have an advance directive? Yes  Type of Advance Directive -  Copy of advanced directive(s) in chart? -  Would patient like information on creating an advanced directive? -  Pre-existing out of facility DNR order (yellow form or pink MOST form) -     Chief Complaint  Patient presents with  . Acute Visit    Elevated blood pressure     HPI:  Pt is a 75 y.o. female seen today at [facility] for an acute visit for elevated blood pressure. She has a medical history of HTN, Anxiety, stroke, GERD among others. She is seen in her room today per facility Nurse request. Facility Nurse reports patient's B/P 160's/80's. Patient's b/p readings have been in the 130's/ 60's -140/ 70's. She denies any headache, dizziness, double vision or chest pain. She states her daughter and granddaughter will be moving in to her house to take care of her upon discharge home. She worries that she has been able to take care of her self up until the Husband passed away and things went down hill. She is tearful during the visit.    Past Medical History  Diagnosis Date  . Hypertension     takes meds daily  . Anxiety   . History of seasonal allergies   . Stroke (HCC)     tia  . GERD (gastroesophageal  reflux disease)   . Arthritis    Past Surgical History  Procedure Laterality Date  . Joint replacement      left hip, right knee  . Rotator cuff repair      bilateral  . Tonsillectomy    . Abdominal hysterectomy    . Intraocular lens insertion      right eye  . Patellectomy Right 10/26/2012    Procedure: PATELLECTOMY;  Surgeon: Nestor Lewandowsky, MD;  Location: [facility] OR;  Service: Orthopedics;  Laterality: Right;  . I&d knee with poly exchange Right 10/26/2012    Procedure: IRRIGATION AND DEBRIDEMENT KNEE WITH POLY EXCHANGE;  Surgeon: Nestor Lewandowsky, MD;  Location: [facility] OR;  Service: Orthopedics;  Laterality: Right;  poly exchange  . Orif femur fracture Right 02/03/2016    Procedure: OPEN REDUCTION INTERNAL FIXATION (ORIF) DISTAL FEMUR FRACTURE;  Surgeon: Gean Birchwood, MD;  Location: [facility] OR;  Service: Orthopedics;  Laterality: Right;    Allergies  Allergen Reactions  . Forteo [Teriparatide (Recombinant)] Shortness Of Breath  . Tape Rash    Paper tape OK      Medication List       This list is accurate as of: 02/16/16  2:52 PM.  Always use your most recent med list.               acetaminophen 325 MG tablet  Commonly known as:  TYLENOL  Take  2 tablets (650 mg total) by mouth every 6 (six) hours as needed for mild pain (or Fever >/= 101).     aspirin EC 325 MG tablet  Take 1 tablet (325 mg total) by mouth 2 (two) times daily.     atenolol 25 MG tablet  Commonly known as:  TENORMIN  Take 25 mg by mouth daily.     atorvastatin 10 MG tablet  Commonly known as:  LIPITOR  Take 10 mg by mouth daily.     bisacodyl 10 MG suppository  Commonly known as:  DULCOLAX  Place 1 suppository (10 mg total) rectally daily as needed for moderate constipation.     Calcium Carbonate-Vitamin D 600-400 MG-UNIT tablet  Take 2 tablets by mouth daily.     docusate sodium 100 MG capsule  Commonly known as:  COLACE  Take 1 capsule (100 mg total) by mouth 2 (two) times daily.     ferrous sulfate 325  (65 FE) MG tablet  Take 1 tablet (325 mg total) by mouth 3 (three) times daily after meals.     FORTEO [place]  Inject into the skin. Reported on 02/16/2016     gabapentin 300 MG capsule  Commonly known as:  NEURONTIN  Take 300 mg by mouth every other day.     hydrALAZINE 25 MG tablet  Commonly known as:  APRESOLINE  Take 1 tablet (25 mg total) by mouth every 6 (six) hours.     Insulin Pen Needle 32G X 4 MM Misc  Pt to use once a day for with her Forteo pen     LORazepam 0.5 MG tablet  Commonly known as:  ATIVAN  Take 0.5 mg by mouth at bedtime.     methocarbamol 500 MG tablet  Commonly known as:  ROBAXIN  Take 1 tablet (500 mg total) by mouth every 6 (six) hours as needed for muscle spasms.     montelukast 10 MG tablet  Commonly known as:  SINGULAIR  Take 10 mg by mouth at bedtime.     omeprazole 20 MG capsule  Commonly known as:  PRILOSEC  Take 20 mg by mouth daily.     oxyCODONE-acetaminophen 5-325 MG tablet  Commonly known as:  ROXICET  Take 1-2 tablets by mouth every 6 (six) hours as needed.     polyethylene glycol packet  Commonly known as:  MIRALAX / GLYCOLAX  Take 17 g by mouth daily as needed for mild constipation.     tizanidine 2 MG capsule  Commonly known as:  ZANAFLEX  Take 1 capsule (2 mg total) by mouth 3 (three) times daily.     URIBEL 118 MG Caps  Take 118 mg by mouth daily.     valsartan 320 MG tablet  Commonly known as:  DIOVAN  Take 320 mg by mouth daily.     vitamin C 500 MG tablet  Commonly known as:  ASCORBIC ACID  Take 500 mg by mouth daily.     VITAMIN D3 PO  Take 1 tablet by mouth daily.     VITAMIN E PO  Take 1 capsule by mouth daily.     VOLTAREN 1 % Gel  Generic drug:  diclofenac sodium  Apply 2 g topically 2 (two) times daily as needed (pain).        Review of Systems  Constitutional: Negative for fever, chills, activity change, appetite change and fatigue.  HENT: Negative for congestion, rhinorrhea, sinus pressure and  sore throat.   Eyes: Negative.  Respiratory: Negative for cough, chest tightness, shortness of breath and wheezing.   Cardiovascular: Negative for chest pain, palpitations and leg swelling.  Gastrointestinal: Negative for nausea, vomiting, abdominal pain, diarrhea, constipation and abdominal distention.  Genitourinary: Negative for dysuria, urgency, frequency and flank pain.  Musculoskeletal: Positive for gait problem.       Uses walker  Skin: Negative for color change, pallor and rash.       rigth knee surgical dressing.   Neurological: Negative for dizziness, seizures, light-headedness and headaches.  Psychiatric/Behavioral: Negative for hallucinations, confusion, sleep disturbance and agitation. The patient is nervous/anxious.     Immunization History  Administered Date(s) Administered  . PPD Test 02/08/2016  . Tdap 02/02/2016   Pertinent  Health Maintenance Due  Topic Date Due  . MAMMOGRAM  10/04/1991  . COLONOSCOPY  10/04/1991  . DEXA SCAN  10/03/2006  . PNA vac Low Risk Adult (1 of 2 - PCV13) 10/03/2006  . INFLUENZA VACCINE  04/19/2016   No flowsheet data found. Functional Status Survey:    Filed Vitals:   02/16/16 1100  BP: 132/59  Pulse: 66  Temp: 98.7 F (37.1 C)  Resp: 18  Height: 5\' 4"  (1.626 m)  Weight: 189 lb (85.73 kg)  SpO2: 98%   Body mass index is 32.43 kg/(m^2). Physical Exam  Constitutional: She is oriented to person, place, and time. She appears well-developed and well-nourished. No distress.  HENT:  Head: Normocephalic.  Mouth/Throat: Oropharynx is clear and moist.  Eyes: Conjunctivae and EOM are normal. Pupils are equal, round, and reactive to light. Right eye exhibits no discharge. Left eye exhibits no discharge. No scleral icterus.  Neck: Normal range of motion. No JVD present. No thyromegaly present.  Cardiovascular: Normal rate, regular rhythm, normal heart sounds and intact distal pulses.  Exam reveals no gallop and no friction rub.   No  murmur heard. Pulmonary/Chest: Effort normal and breath sounds normal. No respiratory distress. She has no wheezes. She has no rales.  Abdominal: Soft. Bowel sounds are normal. She exhibits no distension. There is no tenderness. There is no rebound and no guarding.  Musculoskeletal: She exhibits no edema or tenderness.  Lymphadenopathy:    She has no cervical adenopathy.  Neurological: She is oriented to person, place, and time.  Skin: Skin is warm and dry. No rash noted. No erythema.  Right knee surgical drsg dry, clean and intact. Surrounding skin tissue without any redness, swelling or tenderness to touch.   Psychiatric:  Tearful     Labs reviewed:  Recent Labs  02/03/16 0544 02/04/16 0705 02/04/16 1152 02/05/16 02/05/16 0809  NA 139 137  --  138 138  K 4.1 3.4*  --   --  3.7  CL 105 105  --   --  108  CO2 23 21*  --   --  22  GLUCOSE 99 120*  --   --  116*  BUN 16 13  --  11 11  CREATININE 0.86 1.18*  --  0.7 0.74  CALCIUM 8.4* 7.6*  --   --  8.0*  MG  --   --  1.9  --   --     Recent Labs  02/02/16 1448 02/04/16 0705 02/05/16 0809  AST  --  14* 11*  ALT  --  9* 9*  ALKPHOS  --  24* 35*  BILITOT  --  0.5 0.5  PROT  --  5.2* 5.2*  ALBUMIN 3.6 2.5* 2.4*    Recent Labs  02/02/16 1300  02/05/16 0809 02/07/16 0430 02/08/16 02/08/16 0300  WBC 6.5  < > 6.5 5.7 6.3 6.3  NEUTROABS 5.3  --   --   --   --   --   HGB 11.8*  < > 8.7* 8.0*  --  7.8*  HCT 36.3  < > 27.4* 25.1*  --  25.2*  MCV 82.9  < > 83.3 82.6  --  82.4  PLT 231  < > 176 204  --  229  < > = values in this interval not displayed. No results found for: TSH No results found for: HGBA1C No results found for: CHOL, HDL, LDLCALC, LDLDIRECT, TRIG, CHOLHDL  Significant Diagnostic Results in last 30 days:  Dg Chest 2 View  02/02/2016  CLINICAL DATA:  Preop evaluation for broken right femur EXAM: CHEST  2 VIEW COMPARISON:  07/25/2015 FINDINGS: The heart size and mediastinal contours are within normal  limits. Both lungs are clear. The visualized skeletal structures shows stable degenerative changes of the thoracic spine with increase kyphosis. IMPRESSION: No active cardiopulmonary disease. Electronically Signed   By: Alcide Clever M.D.   On: 02/02/2016 13:25   Dg Knee 1-2 Views Right  02/03/2016  CLINICAL DATA:  Open reduction internal fixation for distal femur fracture EXAM: DG C-ARM 61-120 MIN; RIGHT KNEE - 1-2 VIEW COMPARISON:  Feb 02, 2016 FLUOROSCOPY TIME:  1 minutes 49 seconds; 1 acquired image FINDINGS: Frontal image shows screw and plate fixation through a fracture of the distal femur with alignment near anatomic on this single frontal view. Total knee replacement is noted with the visualized femoral and tibial prosthetic components well-seated. No dislocation evident. IMPRESSION: Screw and rod fixation through distal femur fracture with alignment near anatomic on frontal view. Total knee replacement prostheses appear well seated on frontal view. Electronically Signed   By: Bretta Bang III M.D.   On: 02/03/2016 16:22   Dg Knee Complete 4 Views Right  02/02/2016  CLINICAL DATA:  Tripped and fell on concrete this morning, EXAM: RIGHT KNEE - COMPLETE 4+ VIEW COMPARISON:  None. FINDINGS: Four views of the right knee submitted. There is right knee prosthesis with anatomic alignment. Mild displaced minimal angulated comminuted fracture in distal right femoral metaphysis above the level of prosthesis. IMPRESSION: There is right knee prosthesis with anatomic alignment. Mild displaced minimal angulated comminuted fracture in distal right femoral metaphysis above the level of prosthesis. Electronically Signed   By: Natasha Mead M.D.   On: 02/02/2016 12:22   Dg C-arm 1-60 Min  02/03/2016  CLINICAL DATA:  Open reduction internal fixation for distal femur fracture EXAM: DG C-ARM 61-120 MIN; RIGHT KNEE - 1-2 VIEW COMPARISON:  Feb 02, 2016 FLUOROSCOPY TIME:  1 minutes 49 seconds; 1 acquired image FINDINGS:  Frontal image shows screw and plate fixation through a fracture of the distal femur with alignment near anatomic on this single frontal view. Total knee replacement is noted with the visualized femoral and tibial prosthetic components well-seated. No dislocation evident. IMPRESSION: Screw and rod fixation through distal femur fracture with alignment near anatomic on frontal view. Total knee replacement prostheses appear well seated on frontal view. Electronically Signed   By: Bretta Bang III M.D.   On: 02/03/2016 16:22    Assessment/Plan 1. Essential hypertension Episodic elevated B/P suspect due to pain. Continue Atenolol 25 mg Tablet and Valsartan 320 mg Tablet. Monitor B/p twice daily x 1 week then resume previous orders.   2. Anxiety Tearful during the visit.  Worried about her loss of independence with caring for her self. Continue on Lorazepam 0.5 mg Tablet. Will Consult Psychotherapy.     Family/ staff Communication: Reviewed plan of care with patient and facility Nurse supervisor.   Labs/tests ordered:  None

## 2016-02-25 ENCOUNTER — Other Ambulatory Visit: Payer: Self-pay

## 2016-02-25 MED ORDER — TRAMADOL HCL 50 MG PO TABS: 50.0000 mg | ORAL_TABLET | Freq: Four times a day (QID) | ORAL | Status: DC

## 2016-02-25 NOTE — Telephone Encounter (Signed)
Prescription request was received from:  [facility] [address] [place] [address]  Phone: [phone]  Fax: [phone]  

## 2016-03-01 ENCOUNTER — Non-Acute Institutional Stay (SKILLED_NURSING_FACILITY): Payer: Medicare HMO | Admitting: Family

## 2016-03-01 DIAGNOSIS — K219 Gastro-esophageal reflux disease without esophagitis: Secondary | ICD-10-CM

## 2016-03-01 DIAGNOSIS — D509 Iron deficiency anemia, unspecified: Secondary | ICD-10-CM | POA: Diagnosis not present

## 2016-03-01 DIAGNOSIS — R269 Unspecified abnormalities of gait and mobility: Secondary | ICD-10-CM

## 2016-03-01 DIAGNOSIS — S72471D Torus fracture of lower end of right femur, subsequent encounter for fracture with routine healing: Secondary | ICD-10-CM | POA: Diagnosis not present

## 2016-03-01 DIAGNOSIS — E039 Hypothyroidism, unspecified: Secondary | ICD-10-CM | POA: Diagnosis not present

## 2016-03-01 DIAGNOSIS — E785 Hyperlipidemia, unspecified: Secondary | ICD-10-CM

## 2016-03-01 DIAGNOSIS — F419 Anxiety disorder, unspecified: Secondary | ICD-10-CM | POA: Diagnosis not present

## 2016-03-01 DIAGNOSIS — I10 Essential (primary) hypertension: Secondary | ICD-10-CM

## 2016-03-01 NOTE — Progress Notes (Signed)
Patient ID: Doris Pacheco, female   DOB: 08/08/1941, 75 y.o.   MRN: [id number]  Location:   [facility]    Place of Service:  SNF (31)  Provider:Wassim Kirksey FNP-C  PCP: Ralene OkMOREIRA,ROY, MD Patient Care Team: Ralene Okoy Moreira, MD as PCP - General (Internal Medicine)  Extended Emergency Contact Information Primary Emergency Contact: Grandfield,Richard Address: [address]          [place], [place] [address] [place] Mobile Phone: [phone] Relation: Son Secondary Emergency Contact: Marsland,Tanya Address: [address]          , [place] [address] [place] Mobile Phone: [phone] Relation: Daughter  Code Status:Full Code Goals of care:  Advanced Directive information Advanced Directives 07/25/2015  Does patient have an advance directive? Yes  Type of Advance Directive -  Copy of advanced directive(s) in chart? -  Would patient like information on creating an advanced directive? -  Pre-existing out of facility DNR order (yellow form or pink MOST form) -     Allergies  Allergen Reactions  . Forteo [Teriparatide (Recombinant)] Shortness Of Breath  . Tape Rash    Paper tape OK    Chief Complaint  Patient presents with  . Discharge Note    HPI:  75 y.o. female seen today at [facility] for discharge home. She was here for short term rehabilitation post hospital admission from 02/02/16-02/08/16 post fall with right femoral distal fracture. She underwent ORIF on 02/03/16. She had acute blood loss anemia and required 1 u prbc transfusion. She has a medical history of HTN,Hyperlipidemia, GERD, Anxiety, Stoke among others. She is seen today in her room with grandson at bedside. She denies any acute issues this visit. She has worked well with PT/ OT. She desires to be discharge home with [facility] to continue with ROM, Exercise, Gait stability and muscle strengthening.Right knee pain under control with current  medication. She does not require any DME has own walker.     Past Medical History  Diagnosis Date  . Hypertension     takes meds daily  . Anxiety   . History of seasonal allergies   . Stroke (HCC)     tia  . GERD (gastroesophageal reflux disease)   . Arthritis     Past Surgical History  Procedure Laterality Date  . Joint replacement      left hip, right knee  . Rotator cuff repair      bilateral  . Tonsillectomy    . Abdominal hysterectomy    . Intraocular lens insertion      right eye  . Patellectomy Right 10/26/2012    Procedure: PATELLECTOMY;  Surgeon: Nestor LewandowskyFrank J Rowan, MD;  [facility];  Service: Orthopedics;  Laterality: Right;  . I&d knee with poly exchange Right 10/26/2012    Procedure: IRRIGATION AND DEBRIDEMENT KNEE WITH POLY EXCHANGE;  Surgeon: Nestor LewandowskyFrank J Rowan, MD;  Location: [facility] OR;  Service: Orthopedics;  Laterality: Right;  poly exchange  . Orif femur fracture Right 02/03/2016    Procedure: OPEN REDUCTION INTERNAL FIXATION (ORIF) DISTAL FEMUR FRACTURE;  Surgeon: Gean BirchwoodFrank Rowan, MD;  Location: [facility] OR;  Service: Orthopedics;  Laterality: Right;      reports that she has quit smoking. She has never used smokeless tobacco. She reports that she does not drink alcohol or use illicit drugs. Social History   Social History  . Marital Status: Single    Spouse Name: N/A  .  Number of Children: N/A  . Years of Education: N/A   Occupational History  . Not on file.   Social History Main Topics  . Smoking status: Former Games developer  . Smokeless tobacco: Never Used  . Alcohol Use: No  . Drug Use: No  . Sexual Activity: No   Other Topics Concern  . Not on file   Social History Narrative   Functional Status Survey:    Allergies  Allergen Reactions  . Forteo [Teriparatide (Recombinant)] Shortness Of Breath  . Tape Rash    Paper tape OK    Pertinent  Health Maintenance Due  Topic Date Due  . MAMMOGRAM  10/04/1991  . COLONOSCOPY  10/04/1991  . DEXA SCAN  10/03/2006  .  PNA vac Low Risk Adult (1 of 2 - PCV13) 10/03/2006  . INFLUENZA VACCINE  04/19/2016    Medications:   Medication List       This list is accurate as of: 03/01/16  7:51 PM.  Always use your most recent med list.               acetaminophen 325 MG tablet  Commonly known as:  TYLENOL  Take 2 tablets (650 mg total) by mouth every 6 (six) hours as needed for mild pain (or Fever >/= 101).     aspirin EC 325 MG tablet  Take 1 tablet (325 mg total) by mouth 2 (two) times daily.     atenolol 25 MG tablet  Commonly known as:  TENORMIN  Take 25 mg by mouth daily.     atorvastatin 10 MG tablet  Commonly known as:  LIPITOR  Take 10 mg by mouth daily.     bisacodyl 10 MG suppository  Commonly known as:  DULCOLAX  Place 1 suppository (10 mg total) rectally daily as needed for moderate constipation.     Calcium Carbonate-Vitamin D 600-400 MG-UNIT tablet  Take 2 tablets by mouth daily.     docusate sodium 100 MG capsule  Commonly known as:  COLACE  Take 1 capsule (100 mg total) by mouth 2 (two) times daily.     ferrous sulfate 325 (65 FE) MG tablet  Take 1 tablet (325 mg total) by mouth 3 (three) times daily after meals.     FORTEO [place]  Inject into the skin. Reported on 02/16/2016     gabapentin 300 MG capsule  Commonly known as:  NEURONTIN  Take 300 mg by mouth every other day.     hydrALAZINE 25 MG tablet  Commonly known as:  APRESOLINE  Take 1 tablet (25 mg total) by mouth every 6 (six) hours.     Insulin Pen Needle 32G X 4 MM Misc  Pt to use once a day for with her Forteo pen     LORazepam 0.5 MG tablet  Commonly known as:  ATIVAN  Take 0.5 mg by mouth at bedtime.     methocarbamol 500 MG tablet  Commonly known as:  ROBAXIN  Take 1 tablet (500 mg total) by mouth every 6 (six) hours as needed for muscle spasms.     montelukast 10 MG tablet  Commonly known as:  SINGULAIR  Take 10 mg by mouth at bedtime.     omeprazole 20 MG capsule  Commonly known as:  PRILOSEC    Take 20 mg by mouth daily.     oxyCODONE-acetaminophen 5-325 MG tablet  Commonly known as:  ROXICET  Take 1-2 tablets by mouth every 6 (six) hours as needed.  polyethylene glycol packet  Commonly known as:  MIRALAX / GLYCOLAX  Take 17 g by mouth daily as needed for mild constipation.     tizanidine 2 MG capsule  Commonly known as:  ZANAFLEX  Take 1 capsule (2 mg total) by mouth 3 (three) times daily.     traMADol 50 MG tablet  Commonly known as:  ULTRAM  Take 1 tablet (50 mg total) by mouth every 6 (six) hours.     URIBEL 118 MG Caps  Take 118 mg by mouth daily.     valsartan 320 MG tablet  Commonly known as:  DIOVAN  Take 320 mg by mouth daily.     vitamin C 500 MG tablet  Commonly known as:  ASCORBIC ACID  Take 500 mg by mouth daily.     VITAMIN D3 PO  Take 1 tablet by mouth daily.     VITAMIN E PO  Take 1 capsule by mouth daily.     VOLTAREN 1 % Gel  Generic drug:  diclofenac sodium  Apply 2 g topically 2 (two) times daily as needed (pain).        Review of Systems  Constitutional: Negative for fever, chills, activity change, appetite change and fatigue.  HENT: Negative for congestion, rhinorrhea, sinus pressure and sore throat.   Eyes: Negative.   Respiratory: Negative for cough, chest tightness, shortness of breath and wheezing.   Cardiovascular: Negative for chest pain, palpitations and leg swelling.  Gastrointestinal: Negative for nausea, vomiting, abdominal pain, diarrhea, constipation and abdominal distention.  Genitourinary: Negative for dysuria, urgency, frequency and flank pain.  Musculoskeletal: Positive for gait problem.       Uses walker  Skin: Negative for color change, pallor and rash.       rigth knee surgical dressing.   Neurological: Negative for dizziness, seizures, light-headedness and headaches.  Psychiatric/Behavioral: Negative for hallucinations, confusion, sleep disturbance and agitation. The patient is not nervous/anxious.      Filed Vitals:   03/01/16 1030  BP: 145/81  Pulse: 80  Temp: 98 F (36.7 C)  Resp: 22  Height: 5\' 4"  (1.626 m)  Weight: 189 lb (85.73 kg)  SpO2: 97%   Body mass index is 32.43 kg/(m^2). Physical Exam  Constitutional: She is oriented to person, place, and time. She appears well-developed and well-nourished. No distress.  HENT:  Head: Normocephalic.  Mouth/Throat: Oropharynx is clear and moist.  Eyes: Conjunctivae and EOM are normal. Pupils are equal, round, and reactive to light. Right eye exhibits no discharge. Left eye exhibits no discharge. No scleral icterus.  Neck: Normal range of motion. No JVD present. No thyromegaly present.  Cardiovascular: Normal rate, regular rhythm, normal heart sounds and intact distal pulses.  Exam reveals no gallop and no friction rub.   No murmur heard. Pulmonary/Chest: Effort normal and breath sounds normal. No respiratory distress. She has no wheezes. She has no rales.  Abdominal: Soft. Bowel sounds are normal. She exhibits no distension. There is no tenderness. There is no rebound and no guarding.  Musculoskeletal: She exhibits no edema or tenderness.  Abnormal gait   Lymphadenopathy:    She has no cervical adenopathy.  Neurological: She is oriented to person, place, and time.  Skin: Skin is warm and dry. No rash noted. No erythema.  Right knee surgical drsg dry, clean and intact. Surrounding skin tissue without any redness, swelling or tenderness to touch.   Psychiatric: She has a normal mood and affect.    Labs reviewed: Basic Metabolic  Panel:  Recent Labs  02/03/16 0544 02/04/16 0705 02/04/16 1152 02/05/16 02/05/16 0809  NA 139 137  --  138 138  K 4.1 3.4*  --   --  3.7  CL 105 105  --   --  108  CO2 23 21*  --   --  22  GLUCOSE 99 120*  --   --  116*  BUN 16 13  --  11 11  CREATININE 0.86 1.18*  --  0.7 0.74  CALCIUM 8.4* 7.6*  --   --  8.0*  MG  --   --  1.9  --   --    Liver Function Tests:  Recent Labs   02/02/16 1448 02/04/16 0705 02/05/16 0809  AST  --  14* 11*  ALT  --  9* 9*  ALKPHOS  --  24* 35*  BILITOT  --  0.5 0.5  PROT  --  5.2* 5.2*  ALBUMIN 3.6 2.5* 2.4*   No results for input(s): LIPASE, AMYLASE in the last 8760 hours. No results for input(s): AMMONIA in the last 8760 hours. CBC:  Recent Labs  02/02/16 1300  02/05/16 0809 02/07/16 0430 02/08/16 02/08/16 0300  WBC 6.5  < > 6.5 5.7 6.3 6.3  NEUTROABS 5.3  --   --   --   --   --   HGB 11.8*  < > 8.7* 8.0*  --  7.8*  HCT 36.3  < > 27.4* 25.1*  --  25.2*  MCV 82.9  < > 83.3 82.6  --  82.4  PLT 231  < > 176 204  --  229  < > = values in this interval not displayed. Cardiac Enzymes: No results for input(s): CKTOTAL, CKMB, CKMBINDEX, TROPONINI in the last 8760 hours. BNP: Invalid input(s): POCBNP CBG: No results for input(s): GLUCAP in the last 8760 hours.  Procedures and Imaging Studies During Stay: Dg Chest 2 View  02/02/2016  CLINICAL DATA:  Preop evaluation for broken right femur EXAM: CHEST  2 VIEW COMPARISON:  07/25/2015 FINDINGS: The heart size and mediastinal contours are within normal limits. Both lungs are clear. The visualized skeletal structures shows stable degenerative changes of the thoracic spine with increase kyphosis. IMPRESSION: No active cardiopulmonary disease. Electronically Signed   By: Alcide Clever M.D.   On: 02/02/2016 13:25   Dg Knee 1-2 Views Right  02/03/2016  CLINICAL DATA:  Open reduction internal fixation for distal femur fracture EXAM: DG C-ARM 61-120 MIN; RIGHT KNEE - 1-2 VIEW COMPARISON:  Feb 02, 2016 FLUOROSCOPY TIME:  1 minutes 49 seconds; 1 acquired image FINDINGS: Frontal image shows screw and plate fixation through a fracture of the distal femur with alignment near anatomic on this single frontal view. Total knee replacement is noted with the visualized femoral and tibial prosthetic components well-seated. No dislocation evident. IMPRESSION: Screw and rod fixation through distal femur  fracture with alignment near anatomic on frontal view. Total knee replacement prostheses appear well seated on frontal view. Electronically Signed   By: Bretta Bang III M.D.   On: 02/03/2016 16:22   Dg Knee Complete 4 Views Right  02/02/2016  CLINICAL DATA:  Tripped and fell on concrete this morning, EXAM: RIGHT KNEE - COMPLETE 4+ VIEW COMPARISON:  None. FINDINGS: Four views of the right knee submitted. There is right knee prosthesis with anatomic alignment. Mild displaced minimal angulated comminuted fracture in distal right femoral metaphysis above the level of prosthesis. IMPRESSION: There is right knee prosthesis with  anatomic alignment. Mild displaced minimal angulated comminuted fracture in distal right femoral metaphysis above the level of prosthesis. Electronically Signed   By: Natasha Mead M.D.   On: 02/02/2016 12:22   Dg C-arm 1-60 Min  02/03/2016  CLINICAL DATA:  Open reduction internal fixation for distal femur fracture EXAM: DG C-ARM 61-120 MIN; RIGHT KNEE - 1-2 VIEW COMPARISON:  Feb 02, 2016 FLUOROSCOPY TIME:  1 minutes 49 seconds; 1 acquired image FINDINGS: Frontal image shows screw and plate fixation through a fracture of the distal femur with alignment near anatomic on this single frontal view. Total knee replacement is noted with the visualized femoral and tibial prosthetic components well-seated. No dislocation evident. IMPRESSION: Screw and rod fixation through distal femur fracture with alignment near anatomic on frontal view. Total knee replacement prostheses appear well seated on frontal view. Electronically Signed   By: Bretta Bang III M.D.   On: 02/03/2016 16:22    Assessment/Plan:   HTN B/p Stable. Continue Atenolol 25 mg Tablet and  Hydralazine 25 mg Tablet. BMP in 1-2 weeks with PCP    GERD Continue on Omeprazole 20 mg Capsule.   S/p Right Femur ORIF  Status post short term rehabilitation post hospital admission from 02/02/16-02/08/16 post fall with right femoral  distal fracture. She underwent ORIF on 02/03/16.She has worked well with PT/ OT. She desires to be discharge home with outpatient Rehab at [facility] to continue with ROM, Exercise, Gait stability and muscle strengthening.Right knee pain under control with current medication. She does not require any DME has own walker. Continue ASA. Fall and safety precautions.  Anxiety Continue on Zanaflex   Hypothyroidism  Continue on Levothyroxine.TSH in 8 weeks with PCP   Hyperlipidemia  Continue Lipitor 10 mg tablet. PCP to monitor Lipid panel.   Abnormal Gait   Has worked well with PT/ OT. She desires to discharge home with outpatient Rehab with [facility] to continue with ROM, Exercise, Gait stability and muscle strengthening. She has own DME.  Fall and safety precautions.   Anemia   Status post Right femur ORIF on 02/03/16. She had acute blood loss anemia and required 1 u prbc transfusion. Continue on Ferrous sulfate 325 mg Tablet three times daily.CBC in 1- 2 weeks with PCP.    Patient is being discharged with the following home health services:    None. She will go outpatient Rehab with [facility]  Patient is being discharged with the following durable medical equipment:    None Required has own walker   Prescription medication  written x 1 month supply.    Patient has been advised to f/u with their PCP in 1-2 weeks to bring them up to date on their rehab stay.  Social services at facility was responsible for arranging this appointment.  Pt was provided with a 30 day supply of prescriptions for medications and refills must be obtained from their PCP.  For controlled substances, a more limited supply may be provided adequate until PCP appointment only.  Future labs/tests needed:  CBC, BMP in 1-2 weeks with PCP

## 2016-03-02 DIAGNOSIS — E785 Hyperlipidemia, unspecified: Secondary | ICD-10-CM | POA: Insufficient documentation

## 2016-03-02 DIAGNOSIS — E039 Hypothyroidism, unspecified: Secondary | ICD-10-CM | POA: Insufficient documentation

## 2016-05-02 ENCOUNTER — Other Ambulatory Visit: Payer: Self-pay | Admitting: Gynecology

## 2016-07-08 ENCOUNTER — Encounter (HOSPITAL_COMMUNITY): Payer: Self-pay | Admitting: *Deleted

## 2016-07-08 ENCOUNTER — Emergency Department (HOSPITAL_COMMUNITY)
Admission: EM | Admit: 2016-07-08 | Discharge: 2016-07-08 | Disposition: A | Discharge status: Home / Self Care | Payer: Medicare HMO | Attending: Emergency Medicine | Admitting: Emergency Medicine

## 2016-07-08 ENCOUNTER — Emergency Department (HOSPITAL_COMMUNITY): Payer: Medicare HMO

## 2016-07-08 DIAGNOSIS — Z794 Long term (current) use of insulin: Secondary | ICD-10-CM | POA: Diagnosis not present

## 2016-07-08 DIAGNOSIS — Z96642 Presence of left artificial hip joint: Secondary | ICD-10-CM | POA: Insufficient documentation

## 2016-07-08 DIAGNOSIS — Z7982 Long term (current) use of aspirin: Secondary | ICD-10-CM | POA: Insufficient documentation

## 2016-07-08 DIAGNOSIS — Z79899 Other long term (current) drug therapy: Secondary | ICD-10-CM | POA: Diagnosis not present

## 2016-07-08 DIAGNOSIS — Z96651 Presence of right artificial knee joint: Secondary | ICD-10-CM | POA: Diagnosis not present

## 2016-07-08 DIAGNOSIS — Z87891 Personal history of nicotine dependence: Secondary | ICD-10-CM | POA: Insufficient documentation

## 2016-07-08 DIAGNOSIS — G4489 Other headache syndrome: Secondary | ICD-10-CM | POA: Diagnosis not present

## 2016-07-08 DIAGNOSIS — Z8673 Personal history of transient ischemic attack (TIA), and cerebral infarction without residual deficits: Secondary | ICD-10-CM | POA: Diagnosis not present

## 2016-07-08 DIAGNOSIS — I10 Essential (primary) hypertension: Secondary | ICD-10-CM | POA: Diagnosis not present

## 2016-07-08 DIAGNOSIS — R079 Chest pain, unspecified: Secondary | ICD-10-CM | POA: Diagnosis present

## 2016-07-08 LAB — CBC
HCT: 39.6 % (ref 36.0–46.0)
Hemoglobin: 13.1 g/dL (ref 12.0–15.0)
MCH: 27.6 pg (ref 26.0–34.0)
MCHC: 33.1 g/dL (ref 30.0–36.0)
MCV: 83.4 fL (ref 78.0–100.0)
PLATELETS: 248 10*3/uL (ref 150–400)
RBC: 4.75 MIL/uL (ref 3.87–5.11)
RDW: 15.3 % (ref 11.5–15.5)
WBC: 5.6 10*3/uL (ref 4.0–10.5)

## 2016-07-08 LAB — BASIC METABOLIC PANEL
Anion gap: 7 (ref 5–15)
BUN: 19 mg/dL (ref 6–20)
CALCIUM: 10 mg/dL (ref 8.9–10.3)
CHLORIDE: 107 mmol/L (ref 101–111)
CO2: 24 mmol/L (ref 22–32)
CREATININE: 1.02 mg/dL — AB (ref 0.44–1.00)
GFR calc Af Amer: >60 mL/min (ref >60)
GFR calc non Af Amer: 53 mL/min — ABNORMAL LOW (ref >60)
GLUCOSE: 95 mg/dL (ref 65–99)
Potassium: 4 mmol/L (ref 3.5–5.1)
Sodium: 138 mmol/L (ref 135–145)

## 2016-07-08 LAB — I-STAT TROPONIN, ED: TROPONIN I, POC: 0 ng/mL (ref 0.00–0.08)

## 2016-07-08 LAB — SEDIMENTATION RATE: Sed Rate: 18 mm/hr (ref 0–22)

## 2016-07-08 MED ORDER — HYDROCODONE-ACETAMINOPHEN 5-325 MG PO TABS
1.0000 | ORAL_TABLET | Freq: Once | ORAL | Status: AC
  Administered 2016-07-08: 1 via ORAL
  Filled 2016-07-08: qty 1

## 2016-07-08 NOTE — ED Triage Notes (Signed)
Pt c/o R sided frontal headache (since yesterday) and central, l sided chest pain (for 2 days). Reports seeing eye doctor for headache. Reports mild shortness of breath and weakness. Denies radiation

## 2016-07-08 NOTE — ED Provider Notes (Signed)
[facility] DEPT Provider Note   CSN: [id number] Arrival date & time: 07/08/16  1749     History   Chief Complaint Chief Complaint  Patient presents with  . Chest Pain  . Headache    HPI Doris Pacheco is a 75 y.o. female.  The history is provided by the patient.  Chest Pain   This is a new problem. The current episode started 2 days ago. The problem occurs rarely. The problem has not changed since onset.The pain is associated with coughing. The pain is mild. The pain does not radiate. Associated symptoms include cough and headaches. Pertinent negatives include no fever, no vomiting and no weakness.  Headache   Pertinent negatives include no fever and no vomiting.  Patient presents with multiple complaints:  1. She reports mild frontal HA for past 1-2 days, she reports visual changes that she is unable to describe (denies visual loss) she has already seen her ophthalmologist who did not notice any acute visual issue but was referred back to PCP.  Pt saw PCP and she had medication adjustments.  She reports HA is improved.  No focal weakness reported  2. She reports cough starting about 2 days ago, and she began having CP after cough.  She has mild SOB.    3. She also reports insomnia for 2 days.  She has take Tylenol PM without relief.  She reports her arms and legs "keep jumping" and occurring just prior to my arrival    Past Medical History:  Diagnosis Date  . Anxiety   . Arthritis   . GERD (gastroesophageal reflux disease)   . History of seasonal allergies   . Hypertension    takes meds daily  . Stroke [facility])    tia    Patient Active Problem List   Diagnosis Date Noted  . Hypothyroidism 03/02/2016  . Hyperlipidemia 03/02/2016  . Closed fracture of right distal femur ([place]) 02/03/2016  . Abrasion, knee   . Fracture of femur, distal, right, closed ([place]) 02/02/2016  . Left ventricular diastolic dysfunction 44/96/7591  . Anemia 02/02/2016  . Femoral distal  fracture, right, closed, initial encounter 02/02/2016  . Essential hypertension   . Fall   . Osteoporosis 02/18/2015  . Vertebral fracture, osteoporotic ([place]) 02/18/2015  . Bilateral low back pain without sciatica 02/18/2015  . Midline low back pain without sciatica 01/08/2015  . Chest pain 07/30/2013  . Hypertension   . Anxiety   . History of seasonal allergies   . History of CVA (cerebrovascular accident)   . GERD (gastroesophageal reflux disease)   . Arthritis   . 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 ABDOMINAL HYSTERECTOMY    . I&D KNEE WITH POLY EXCHANGE Right 10/26/2012   Procedure: IRRIGATION AND DEBRIDEMENT KNEE WITH POLY EXCHANGE;  Surgeon: Kerin Salen, MD;  Location: [place];  Service: Orthopedics;  Laterality: Right;  poly exchange  . INTRAOCULAR LENS INSERTION     right eye  . JOINT REPLACEMENT     left hip, right knee  . ORIF FEMUR FRACTURE Right 02/03/2016   Procedure: OPEN REDUCTION INTERNAL FIXATION (ORIF) DISTAL FEMUR FRACTURE;  Surgeon: Frederik Pear, MD;  Location: [place];  Service: Orthopedics;  Laterality: Right;  . PATELLECTOMY Right 10/26/2012   Procedure: PATELLECTOMY;  Surgeon: Kerin Salen, MD;  Location: [place];  Service: Orthopedics;  Laterality: Right;  . ROTATOR CUFF REPAIR  bilateral  . TONSILLECTOMY      OB History    Gravida Para Term Preterm AB Living   _0 SAB TAB Ectopic Multiple Live Births                   Home Medications    Prior to Admission medications   Medication Sig Start Date End Date Taking? Authorizing Provider  Aspirin-Omeprazole (YOSPRALA) 81-40 MG TBEC Take 1 tablet by mouth daily.   Yes Historical Provider, MD  atenolol (TENORMIN) 25 MG tablet Take 25 mg by mouth daily.   Yes Historical Provider, MD  atorvastatin (LIPITOR) 20 MG tablet Take 20 mg by mouth daily.   Yes Historical Provider, MD  calcium carbonate (OSCAL) 1500 (600 Ca) MG TABS  tablet Take 1,500 mg by mouth daily with breakfast.   Yes Historical Provider, MD  estrogens, conjugated, (PREMARIN) 1.25 MG tablet Take 1.25 mg by mouth daily.   Yes Historical Provider, MD  hydrALAZINE (APRESOLINE) 10 MG tablet Take 10 mg by mouth 3 (three) times daily.   Yes Historical Provider, MD  lovastatin (MEVACOR) 20 MG tablet Take 20 mg by mouth at bedtime.   Yes Historical Provider, MD  Meth-Hyo-M Bl-Na Phos-Ph Sal (URIBEL) 118 MG CAPS Take 118 mg by mouth daily.    Yes Historical Provider, MD  valsartan (DIOVAN) 320 MG tablet Take 320 mg by mouth 2 (two) times daily.    Yes Historical Provider, MD  acetaminophen (TYLENOL) 325 MG tablet Take 2 tablets (650 mg total) by mouth every 6 (six) hours as needed for mild pain (or Fever >/= 101). Patient not taking: Reported on 07/08/2016 02/05/16   Reyne Dumas, MD  aspirin EC 325 MG tablet Take 1 tablet (325 mg total) by mouth 2 (two) times daily. Patient not taking: Reported on 07/08/2016 02/03/16   Leighton Parody, PA-C  bisacodyl (DULCOLAX) 10 MG suppository Place 1 suppository (10 mg total) rectally daily as needed for moderate constipation. Patient not taking: Reported on 07/08/2016 02/05/16   Reyne Dumas, MD  docusate sodium (COLACE) 100 MG capsule Take 1 capsule (100 mg total) by mouth 2 (two) times daily. Patient not taking: Reported on 07/08/2016 02/05/16   Reyne Dumas, MD  ferrous sulfate 325 (65 FE) MG tablet Take 1 tablet (325 mg total) by mouth 3 (three) times daily after meals. Patient not taking: Reported on 07/08/2016 02/05/16   Reyne Dumas, MD  hydrALAZINE (APRESOLINE) 25 MG tablet Take 1 tablet (25 mg total) by mouth every 6 (six) hours. Patient not taking: Reported on 07/08/2016 02/05/16   Reyne Dumas, MD  Insulin Pen Needle 32G X 4 MM MISC Pt to use once a day for with her Forteo pen 09/08/15   Terrance Mass, MD  methocarbamol (ROBAXIN) 500 MG tablet Take 1 tablet (500 mg total) by mouth every 6 (six) hours as needed for  muscle spasms. Patient not taking: Reported on 07/08/2016 02/05/16   Reyne Dumas, MD  oxyCODONE-acetaminophen (ROXICET) 5-325 MG tablet Take 1-2 tablets by mouth every 6 (six) hours as needed. Patient not taking: Reported on 07/08/2016 02/06/16   Lavina Hamman, MD  polyethylene glycol [facility] / Floria Raveling) packet Take 17 g by mouth daily as needed for mild constipation. Patient not taking: Reported on 07/08/2016 02/05/16   Reyne Dumas, MD  tizanidine (ZANAFLEX) 2 MG capsule Take 1 capsule (2 mg total) by mouth 3 (three) times daily. Patient not taking: Reported  on 07/08/2016 02/03/16   Leighton Parody, PA-C  traMADol (ULTRAM) 50 MG tablet Take 1 tablet (50 mg total) by mouth every 6 (six) hours. Patient not taking: Reported on 07/08/2016 02/25/16   Lauree Chandler, NP    Family History Family History  Problem Relation Age of Onset  . Cancer Mother     COLON  . Diabetes Sister   . Stroke Sister   . Hypertension Father   . Pneumonia Father     Social History Social History  Substance Use Topics  . Smoking status: Former Research scientist (life sciences)  . Smokeless tobacco: Never Used  . Alcohol use No     Allergies   Forteo [teriparatide (recombinant)] and Tape   Review of Systems Review of Systems  Constitutional: Negative for fever.  Respiratory: Positive for cough.   Cardiovascular: Positive for chest pain.       CP with cough   Gastrointestinal: Negative for vomiting.  Neurological: Positive for headaches. Negative for weakness.  Psychiatric/Behavioral: Positive for sleep disturbance.       Insomnia x 2 days   All other systems reviewed and are negative.    Physical Exam Updated Vital Signs BP 160/91   Pulse 61   Temp 98.3 F (36.8 C)   Resp 16   SpO2 100%   Physical Exam CONSTITUTIONAL: Well developed/well nourished. She was sleeping on arrival to room HEAD: Normocephalic/atraumatic, no signs of trauma, mild tenderness forehead EYES: EOMI/PERRL, no nystagmus,  no ptosis No  corneal hazing noted.  No conjunctival erythema/edema noted ENMT: Mucous membranes moist NECK: supple no meningeal signs, no bruits CV: S1/S2 noted, no murmurs/rubs/gallops noted LUNGS: Lungs are clear to auscultation bilaterally, no apparent distress Chest - mild tenderness to chest wall.   ABDOMEN: soft, nontender, no rebound or guarding GU:no cva tenderness NEURO:Awake/alert, face symmetric, no arm or leg drift is noted Cranial nerves 3/4/5/6/03/27/09/11/12 tested and intact No past pointing Sensation to light touch intact in all extremities EXTREMITIES: pulses normal, full ROM SKIN: warm, color normal PSYCH: no abnormalities of mood noted   ED Treatments / Results  Labs (all labs ordered are listed, but only abnormal results are displayed) Labs Reviewed  BASIC METABOLIC PANEL - Abnormal; Notable for the following:       Result Value   Creatinine, Ser 1.02 (*)    GFR calc non Af Amer 53 (*)    All other components within normal limits  CBC  SEDIMENTATION RATE  I-STAT TROPOININ, ED    EKG  EKG Interpretation  Date/Time:  Friday July 08 2016 03:22:47 EDT Ventricular Rate:  60 PR Interval:  166 QRS Duration: 84 QT Interval:  408 QTC Calculation: 408 R Axis:   21 Text Interpretation:  Normal sinus rhythm Normal ECG No significant change since last tracing Confirmed by [place] ([id number]) on 07/08/2016 4:57:33 AM       Radiology Dg Chest 2 View  Result Date: 07/08/2016 CLINICAL DATA:  Initial evaluation for acute chest pain. EXAM: CHEST  2 VIEW COMPARISON:  Prior radiograph from 02/02/2016. FINDINGS: Cardiac and mediastinal silhouettes are stable in size and contour, and remain within normal limits. Atheromatous plaque noted within the aortic arch. Tortuosity the intrathoracic aorta noted. Lungs mildly hypoinflated with elevation left hemidiaphragm, stable. No focal infiltrates, pulmonary edema, or pleural effusion. No pneumothorax. No acute osseus  abnormality. Mild vertebral body wedging within the lower thoracic spine is stable. Sequela prior rotator cuff repair noted at the right shoulder. IMPRESSION: 1. No  active cardiopulmonary disease. 2. Mild elevation left hemidiaphragm, stable from prior, and likely chronic. 3. Aortic atherosclerosis. Electronically Signed   By: Jeannine Boga M.D.   On: 07/08/2016 05:11    Procedures Procedures (including critical care time)  Medications Ordered in ED Medications  HYDROcodone-acetaminophen (NORCO/VICODIN) 5-325 MG per tablet 1 tablet (1 tablet Oral Given 07/08/16 0532)     Initial Impression / Assessment and Plan / ED Course  I have reviewed the triage vital signs and the nursing notes.  Pertinent labs & imaging results that were available during my care of the patient were reviewed by me and considered in my medical decision making (see chart for details).  Clinical Course    Pt here with multiple complaints: For HA - no focal weakness, reports it is mild at this time, will check ESR to r/o temporal arteritis, otherwise well appearing  For CP = this appears associated with cough.  CXR negative I doubt ACS/PE at this time 7:07 AM Pt resting comfortably No distress noted Will d/c home We discussed strict return precautions  Final Clinical Impressions(s) / ED Diagnoses   Final diagnoses:  Chest pain, unspecified type  Other headache syndrome    New Prescriptions New Prescriptions   No medications on file     Ripley Fraise, MD 07/08/16 [phone]

## 2016-07-08 NOTE — Discharge Instructions (Signed)
You are having a headache. No specific cause was found today for your headache. It may have been a migraine or other cause of headache. Stress, anxiety, fatigue, and depression are common triggers for headaches. Your headache today does not appear to be life-threatening or require hospitalization, but often the exact cause of headaches is not determined in the emergency department. Therefore, follow-up with your doctor is very important to find out what may have caused your headache, and whether or not you need any further diagnostic testing or treatment. Sometimes headaches can appear benign (not harmful), but then more serious symptoms can develop which should prompt an immediate re-evaluation by your doctor or the emergency department. ° °SEEK MEDICAL ATTENTION IF: ° °You develop possible problems with medications prescribed.  °The medications don't resolve your headache, if it recurs , or if you have multiple episodes of vomiting or can't take fluids. °You have a change from the usual headache. ° °RETURN IMMEDIATELY IF you develop a sudden, severe headache or confusion, become poorly responsive or faint, develop a fever above 100.4F or problem breathing, have a change in speech, vision, swallowing, or understanding, or develop new weakness, numbness, tingling, incoordination, or have a seizure. ° °Your caregiver has diagnosed you as having chest pain that is not specific for one problem, but does not require admission.  Chest pain comes from many different causes.  °SEEK IMMEDIATE MEDICAL ATTENTION IF: °You have severe chest pain, especially if the pain is crushing or pressure-like and spreads to the arms, back, neck, or jaw, or if you have sweating, nausea (feeling sick to your stomach), or shortness of breath. THIS IS AN EMERGENCY. Don't wait to see if the pain will go away. Get medical help at once. Call 911 or 0 (operator). DO NOT drive yourself to the hospital.  °Your chest pain gets worse and does not go  away with rest.  °You have an attack of chest pain lasting longer than usual, despite rest and treatment with the medications your caregiver has prescribed.  °You wake from sleep with chest pain or shortness of breath.  °You feel dizzy or faint.  °You have chest pain not typical of your usual pain for which you originally saw your caregiver. ° °

## 2016-07-08 NOTE — ED Notes (Signed)
Pt ambulated to the bathroom without difficulty.  

## 2016-07-08 NOTE — ED Notes (Signed)
Spoke with main lab that states they will add on sed rate to labs already drawn

## 2016-10-25 ENCOUNTER — Telehealth: Payer: Self-pay | Admitting: Gynecology

## 2016-10-25 DIAGNOSIS — M810 Age-related osteoporosis without current pathological fracture: Secondary | ICD-10-CM

## 2016-10-25 NOTE — Telephone Encounter (Addendum)
Reclast due 10/20/16.  Complete exam with JF 12/2015 Called patient she stated that she fractured femur 01/2016 was in Nursing home and now improved at home walking with walker. I will inform Dr Lily PeerFernandez prior to schedule blood work for [facility].

## 2016-10-26 NOTE — Telephone Encounter (Signed)
Please find out the name of patient's orthopedic surgeon and half Victorino DikeJennifer get him on the phone I would like to be to have about this patient before starting Reclast again

## 2016-10-26 NOTE — Telephone Encounter (Signed)
Doris DikeJennifer look at 01/2016 In EPIC fracture femur Gean BirchwoodFrank Rowan MD

## 2016-10-26 NOTE — Telephone Encounter (Signed)
I called and left a message for Dr.Frank Turner DanielsRowan to call you

## 2016-10-27 NOTE — Telephone Encounter (Signed)
Please inform Doris Pacheco to make arrangements for this patient to restart her Reclast I have briefed received clearance from her orthopedic surgeon. Patient's last calcium level was normal 4 months ago.

## 2016-10-27 NOTE — Telephone Encounter (Signed)
Labs ordered pt call me date and time to come in

## 2016-10-28 ENCOUNTER — Other Ambulatory Visit: Payer: Medicare HMO

## 2016-10-28 DIAGNOSIS — M810 Age-related osteoporosis without current pathological fracture: Secondary | ICD-10-CM

## 2016-10-28 LAB — CALCIUM: Calcium: 9.6 mg/dL (ref 8.6–10.4)

## 2016-10-28 LAB — BUN: BUN: 24 mg/dL (ref 7–25)

## 2016-10-28 LAB — CREATININE, SERUM: CREATININE: 0.82 mg/dL (ref 0.60–0.93)

## 2016-11-02 NOTE — Telephone Encounter (Signed)
PC check on benefits

## 2016-11-07 NOTE — Telephone Encounter (Signed)
PC to pt [facility]

## 2016-11-10 NOTE — Telephone Encounter (Signed)
Phione call #[phone] from patient. Very upset about time for the PA to complete. I told her I would call her as soon as I received the approval from insurance company.

## 2016-11-14 NOTE — Telephone Encounter (Signed)
Reclast approval [employer] Reference # [id number]  11/03/16 thru 05/07/17 [id number]  M81.0  Patient scheduled at 10 30 on  11/22/16

## 2016-11-16 ENCOUNTER — Encounter: Payer: Self-pay | Admitting: Anesthesiology

## 2016-11-21 ENCOUNTER — Other Ambulatory Visit (HOSPITAL_COMMUNITY): Payer: Self-pay | Admitting: *Deleted

## 2016-11-22 ENCOUNTER — Ambulatory Visit (HOSPITAL_COMMUNITY)
Admission: RE | Admit: 2016-11-22 | Discharge: 2016-11-22 | Disposition: A | Discharge status: Home / Self Care | Payer: Medicare HMO | Source: Ambulatory Visit | Attending: Gynecology | Admitting: Gynecology

## 2016-11-22 DIAGNOSIS — M810 Age-related osteoporosis without current pathological fracture: Secondary | ICD-10-CM | POA: Insufficient documentation

## 2016-11-22 MED ORDER — ZOLEDRONIC ACID 5 MG/100ML IV SOLN
INTRAVENOUS | Status: AC
  Administered 2016-11-22: 5 mg via INTRAVENOUS
  Filled 2016-11-22: qty 100

## 2016-11-22 MED ORDER — ZOLEDRONIC ACID 5 MG/100ML IV SOLN
5.0000 mg | Freq: Once | INTRAVENOUS | Status: AC
  Administered 2016-11-22: 5 mg via INTRAVENOUS

## 2016-11-22 NOTE — Telephone Encounter (Addendum)
Reclast Infusion today, Next Reclast due after 11/23/17

## 2016-11-28 NOTE — Telephone Encounter (Signed)
[facility]

## 2016-12-20 ENCOUNTER — Other Ambulatory Visit (HOSPITAL_COMMUNITY): Payer: Self-pay | Admitting: Respiratory Therapy

## 2016-12-20 DIAGNOSIS — R059 Cough, unspecified: Secondary | ICD-10-CM

## 2016-12-20 DIAGNOSIS — R05 Cough: Secondary | ICD-10-CM

## 2016-12-20 DIAGNOSIS — Z87891 Personal history of nicotine dependence: Secondary | ICD-10-CM

## 2016-12-21 ENCOUNTER — Other Ambulatory Visit: Payer: Self-pay | Admitting: Internal Medicine

## 2016-12-21 DIAGNOSIS — F17200 Nicotine dependence, unspecified, uncomplicated: Secondary | ICD-10-CM

## 2016-12-21 DIAGNOSIS — R1013 Epigastric pain: Secondary | ICD-10-CM

## 2016-12-26 ENCOUNTER — Ambulatory Visit (HOSPITAL_COMMUNITY)
Admission: RE | Admit: 2016-12-26 | Discharge: 2016-12-26 | Disposition: A | Discharge status: Home / Self Care | Payer: Medicare HMO | Source: Ambulatory Visit | Attending: Internal Medicine | Admitting: Internal Medicine

## 2016-12-26 DIAGNOSIS — J984 Other disorders of lung: Secondary | ICD-10-CM | POA: Diagnosis not present

## 2016-12-26 DIAGNOSIS — R05 Cough: Secondary | ICD-10-CM | POA: Diagnosis present

## 2016-12-26 DIAGNOSIS — Z87891 Personal history of nicotine dependence: Secondary | ICD-10-CM | POA: Diagnosis present

## 2016-12-26 LAB — PULMONARY FUNCTION TEST
DL/VA % PRED: 78 %
DL/VA: 3.74 ml/min/mmHg/L
DLCO COR % PRED: 46 %
DLCO COR: 11.18 ml/min/mmHg
DLCO UNC % PRED: 46 %
DLCO UNC: 11.18 ml/min/mmHg
FEF 25-75 POST: 1.52 L/s
FEF 25-75 PRE: 1.79 L/s
FEF2575-%Change-Post: -14 %
FEF2575-%PRED-PRE: 108 %
FEF2575-%Pred-Post: 92 %
FEV1-%Change-Post: -5 %
FEV1-%PRED-PRE: 76 %
FEV1-%Pred-Post: 72 %
FEV1-Post: 1.52 L
FEV1-Pre: 1.61 L
FEV1FVC-%CHANGE-POST: -1 %
FEV1FVC-%Pred-Pre: 111 %
FEV6-%CHANGE-POST: -4 %
FEV6-%PRED-PRE: 71 %
FEV6-%Pred-Post: 68 %
FEV6-Post: 1.83 L
FEV6-Pre: 1.92 L
FEV6FVC-%PRED-POST: 105 %
FEV6FVC-%Pred-Pre: 105 %
FVC-%Change-Post: -3 %
FVC-%PRED-POST: 66 %
FVC-%Pred-Pre: 68 %
FVC-Post: 1.86 L
FVC-Pre: 1.92 L
POST FEV6/FVC RATIO: 100 %
PRE FEV1/FVC RATIO: 84 %
Post FEV1/FVC ratio: 82 %
Pre FEV6/FVC Ratio: 100 %
RV % pred: 69 %
RV: 1.58 L
TLC % PRED: 69 %
TLC: 3.53 L

## 2016-12-26 MED ORDER — ALBUTEROL SULFATE (2.5 MG/3ML) 0.083% IN NEBU
2.5000 mg | INHALATION_SOLUTION | Freq: Once | RESPIRATORY_TRACT | Status: AC
  Administered 2016-12-26: 2.5 mg via RESPIRATORY_TRACT

## 2016-12-29 ENCOUNTER — Other Ambulatory Visit: Payer: Self-pay

## 2017-01-06 ENCOUNTER — Ambulatory Visit
Admission: RE | Admit: 2017-01-06 | Discharge: 2017-01-06 | Disposition: A | Discharge status: Home / Self Care | Payer: Medicare HMO | Source: Ambulatory Visit | Attending: Internal Medicine | Admitting: Internal Medicine

## 2017-01-06 DIAGNOSIS — F17200 Nicotine dependence, unspecified, uncomplicated: Secondary | ICD-10-CM

## 2017-01-06 DIAGNOSIS — R1013 Epigastric pain: Secondary | ICD-10-CM

## 2017-02-01 ENCOUNTER — Encounter: Payer: Self-pay | Admitting: Gynecology

## 2017-03-07 ENCOUNTER — Ambulatory Visit (INDEPENDENT_AMBULATORY_CARE_PROVIDER_SITE_OTHER): Payer: Medicare HMO | Admitting: Gynecology

## 2017-03-07 ENCOUNTER — Encounter: Payer: Self-pay | Admitting: Gynecology

## 2017-03-07 VITALS — BP 138/80 | Ht 64.0 in | Wt 166.0 lb

## 2017-03-07 DIAGNOSIS — M810 Age-related osteoporosis without current pathological fracture: Secondary | ICD-10-CM

## 2017-03-07 DIAGNOSIS — Z01419 Encounter for gynecological examination (general) (routine) without abnormal findings: Secondary | ICD-10-CM | POA: Diagnosis not present

## 2017-03-07 MED ORDER — CLINDAMYCIN PHOSPHATE 2 % VA CREA: 1.0000 | TOPICAL_CREAM | Freq: Every day | VAGINAL | 3 refills | Status: DC

## 2017-03-07 NOTE — Patient Instructions (Signed)
Bone Densitometry Bone densitometry is an imaging test that uses a special X-ray to measure the amount of calcium and other minerals in your bones (bone density). This test is also known as a bone mineral density test or dual-energy X-ray absorptiometry (DXA). The test can measure bone density at your hip and your spine. It is similar to having a regular X-ray. You may have this test to:  Diagnose a condition that causes weak or thin bones (osteoporosis).  Predict your risk of a broken bone (fracture).  Determine how well osteoporosis treatment is working.  Tell a health care provider about:  Any allergies you have.  All medicines you are taking, including vitamins, herbs, eye drops, creams, and over-the-counter medicines.  Any problems you or family members have had with anesthetic medicines.  Any blood disorders you have.  Any surgeries you have had.  Any medical conditions you have.  Possibility of pregnancy.  Any other medical test you had within the previous 14 days that used contrast material. What are the risks? Generally, this is a safe procedure. However, problems can occur and may include the following:  This test exposes you to a very small amount of radiation.  The risks of radiation exposure may be greater to unborn children.  What happens before the procedure?  Do not take any calcium supplements for 24 hours before having the test. You can otherwise eat and drink what you usually do.  Take off all metal jewelry, eyeglasses, dental appliances, and any other metal objects. What happens during the procedure?  You may lie on an exam table. There will be an X-ray generator below you and an imaging device above you.  Other devices, such as boxes or braces, may be used to position your body properly for the scan.  You will need to lie still while the machine slowly scans your body.  The images will show up on a computer monitor. What happens after the  procedure? You may need more testing at a later time. This information is not intended to replace advice given to you by your health care provider. Make sure you discuss any questions you have with your health care provider. Document Released: 09/27/2004 Document Revised: 02/11/2016 Document Reviewed: 02/13/2014 Elsevier Interactive Patient Education  2018 Elsevier Inc.   

## 2017-03-07 NOTE — Progress Notes (Signed)
Doris Pacheco 06-Aug-1941 [id number]   History:    76 y.o.  for annual gyn exam with no complaints today. Patient with known history of osteoporosis. Patient then had a right femur a knee fracture last May from a fall. Patient currently on Reclast IV Infusion once a year's a result of her osteoporosis. Patient's last bone density study in 2016 demonstrated the following:Lowest T score right femoral neck -1.3 Frax analysis: 4.6/0.7  Vertebral fracture analysis demonstrated wedge fracture at T11 and T12. As a result of this finding by definition of the [facility] classification patient with vertebral fracture regardless of peripheral T scores is classified as osteoporosis. I had spoken with orthopedic surgeon about starting the patient on Forteo which she could not tolerate and thus was started on Reclast . She received her first dose in 2017 and her dose this year was in March. Patient stated she had a colonoscopy this year benign polyps were removed the same is in 2013 and she is on a 5 year recall.  Patient with no reportable past history of any abnormal Pap smears in the past.Her PCP Dr. Harrold Donath has been doing her blood work. Patient states that many years ago she had a total abdominal hysterectomy with ovarian conservation as a result of symptomatic leiomyomatous uteri. Patient denies any vaginal bleeding and has been followed by the orthopedic surgeons a result of receiving injections for back pains.   Past medical history,surgical history, family history and social history were all reviewed and documented in the EPIC chart.  Gynecologic History No LMP recorded. Patient is postmenopausal. Contraception: post menopausal status Last Pap: Several years ago. Results were: normal Last mammogram: 2017. Results were: normal  Obstetric History OB History  Gravida Para Term Preterm AB Living  3 3       3   SAB TAB Ectopic Multiple Live Births               # Outcome Date GA Lbr Len/2nd Weight Sex  Delivery Anes PTL Lv  3 Para           2 Para           1 Para                ROS: A ROS was performed and pertinent positives and negatives are included in the history.  GENERAL: No fevers or chills. HEENT: No change in vision, no earache, sore throat or sinus congestion. NECK: No pain or stiffness. CARDIOVASCULAR: No chest pain or pressure. No palpitations. PULMONARY: No shortness of breath, cough or wheeze. GASTROINTESTINAL: No abdominal pain, nausea, vomiting or diarrhea, melena or bright red blood per rectum. GENITOURINARY: No urinary frequency, urgency, hesitancy or dysuria. MUSCULOSKELETAL: No joint or muscle pain, no back pain, no recent trauma. DERMATOLOGIC: No rash, no itching, no lesions. ENDOCRINE: No polyuria, polydipsia, no heat or cold intolerance. No recent change in weight. HEMATOLOGICAL: No anemia or easy bruising or bleeding. NEUROLOGIC: No headache, seizures, numbness, tingling or weakness. PSYCHIATRIC: No depression, no loss of interest in normal activity or change in sleep pattern.     Exam: chaperone present  BP 138/80   Ht 5\' 4"  (1.626 m)   Wt 166 lb (75.3 kg)   BMI 28.49 kg/m   Body mass index is 28.49 kg/m.  General appearance : Well developed well nourished female. No acute distress HEENT: Eyes: no retinal hemorrhage or exudates,  Neck supple, trachea midline, no carotid bruits, no thyroidmegaly Lungs: Clear to auscultation,  no rhonchi or wheezes, or rib retractions  Heart: Regular rate and rhythm, no murmurs or gallops Breast:Examined in sitting and supine position were symmetrical in appearance, no palpable masses or tenderness,  no skin retraction, no nipple inversion, no nipple discharge, no skin discoloration, no axillary or supraclavicular lymphadenopathy Abdomen: no palpable masses or tenderness, no rebound or guarding Extremities: no edema or skin discoloration or tenderness  Pelvic:  Bartholin, Urethra, Skene Glands: Within normal limits              Vagina: No gross lesions or discharge, atrophic changes  Cervix: Absent  Uterus  absent  Adnexa  Without masses or tenderness  Anus and perineum  normal   Rectovaginal  normal sphincter tone without palpated masses or tenderness             Hemoccult Hemoccult colonoscopy this year benign polyps removed     Assessment/Plan:  76 y.o. female for annual exam with history of osteoporosis is not due for her Reclast until next year. She will schedule her bone density study in the next few weeks. In the office. She also needs her mammogram this year. Her PCP has been doing her blood work. Patient longer needs Pap smears.   Ok EdwardsFERNANDEZ,JUAN H MD, 3:03 PM 03/07/2017

## 2017-03-08 ENCOUNTER — Other Ambulatory Visit: Payer: Self-pay | Admitting: *Deleted

## 2017-03-08 DIAGNOSIS — M818 Other osteoporosis without current pathological fracture: Secondary | ICD-10-CM

## 2017-03-21 ENCOUNTER — Ambulatory Visit (INDEPENDENT_AMBULATORY_CARE_PROVIDER_SITE_OTHER): Payer: Medicare HMO

## 2017-03-21 DIAGNOSIS — M818 Other osteoporosis without current pathological fracture: Secondary | ICD-10-CM | POA: Diagnosis not present

## 2017-09-30 IMAGING — DX DG KNEE COMPLETE 4+V*R*
4 series · 4 of 4 positions shown · non-contrast
Comparison: None.

CLINICAL DATA: Tripped and fell on concrete this morning,

EXAM:
RIGHT KNEE - COMPLETE 4+ VIEW

[t knee ap right]
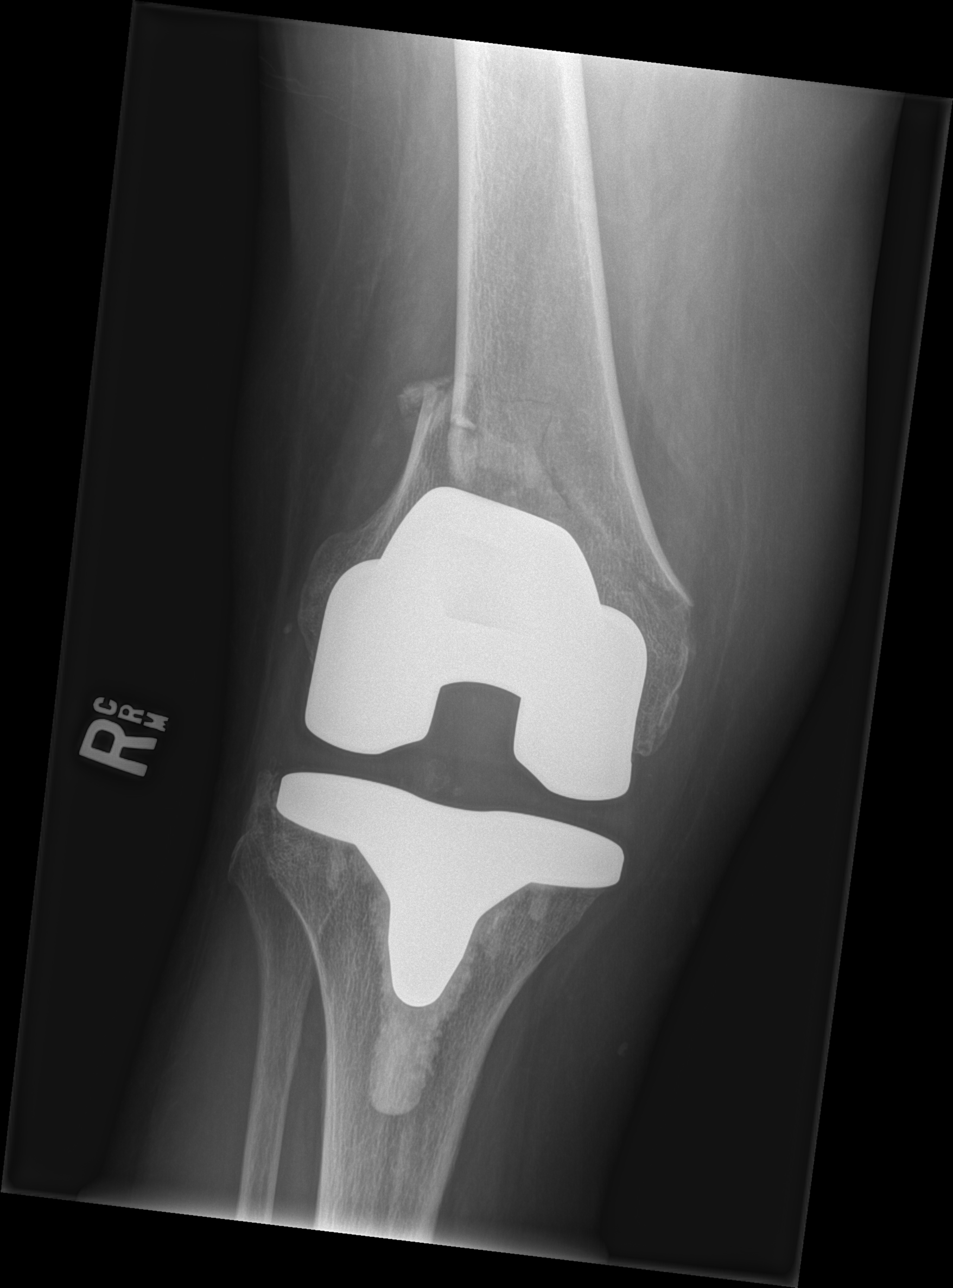

[t knee obl right (1 of 2)]
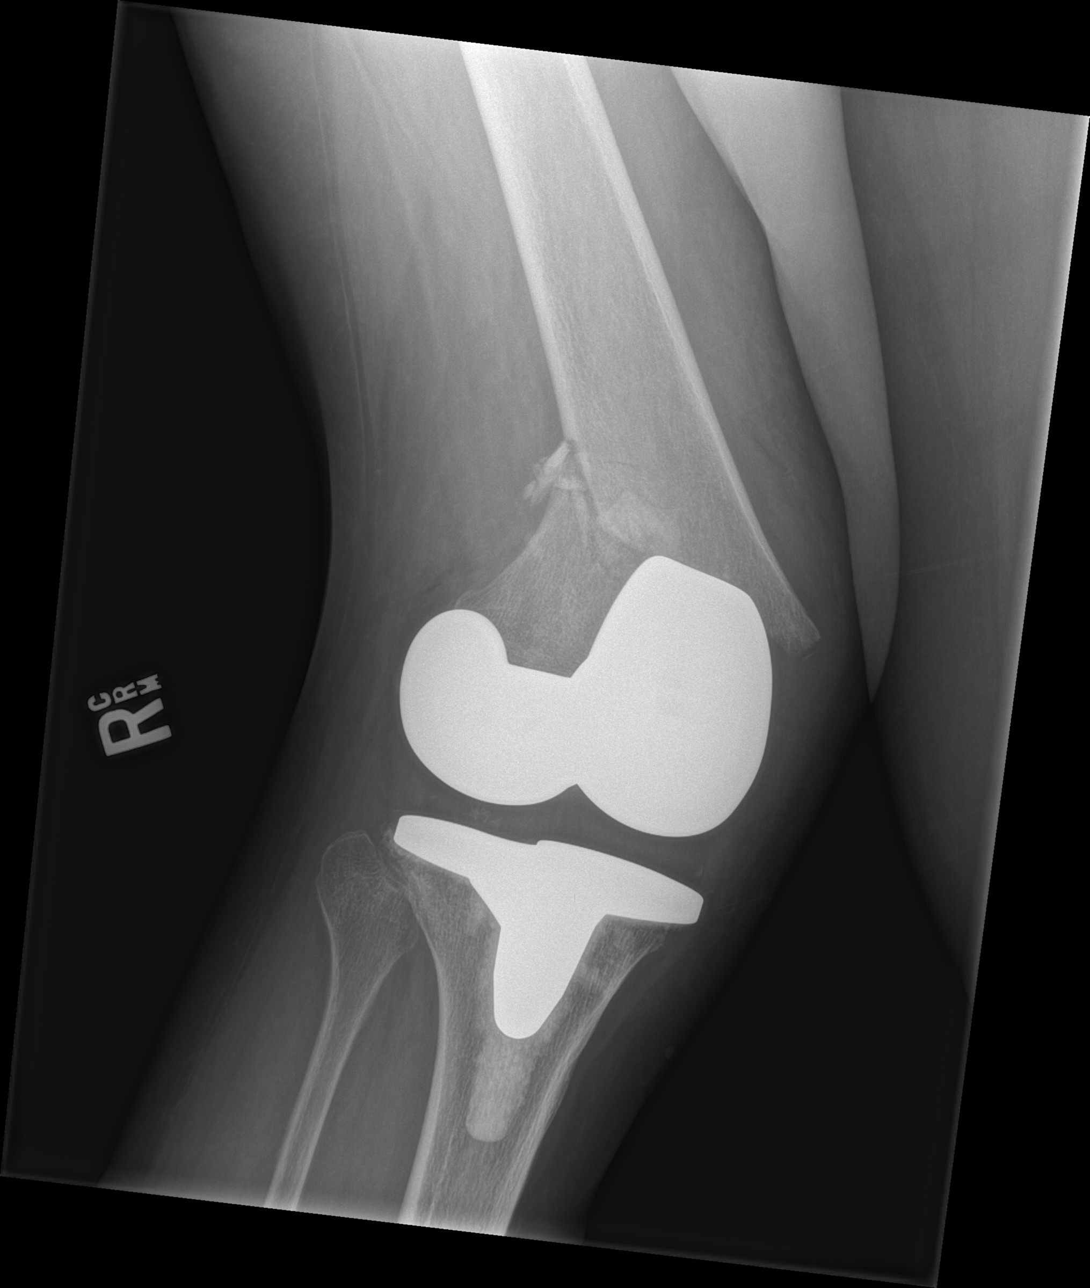

[t knee obl right (2 of 2)]
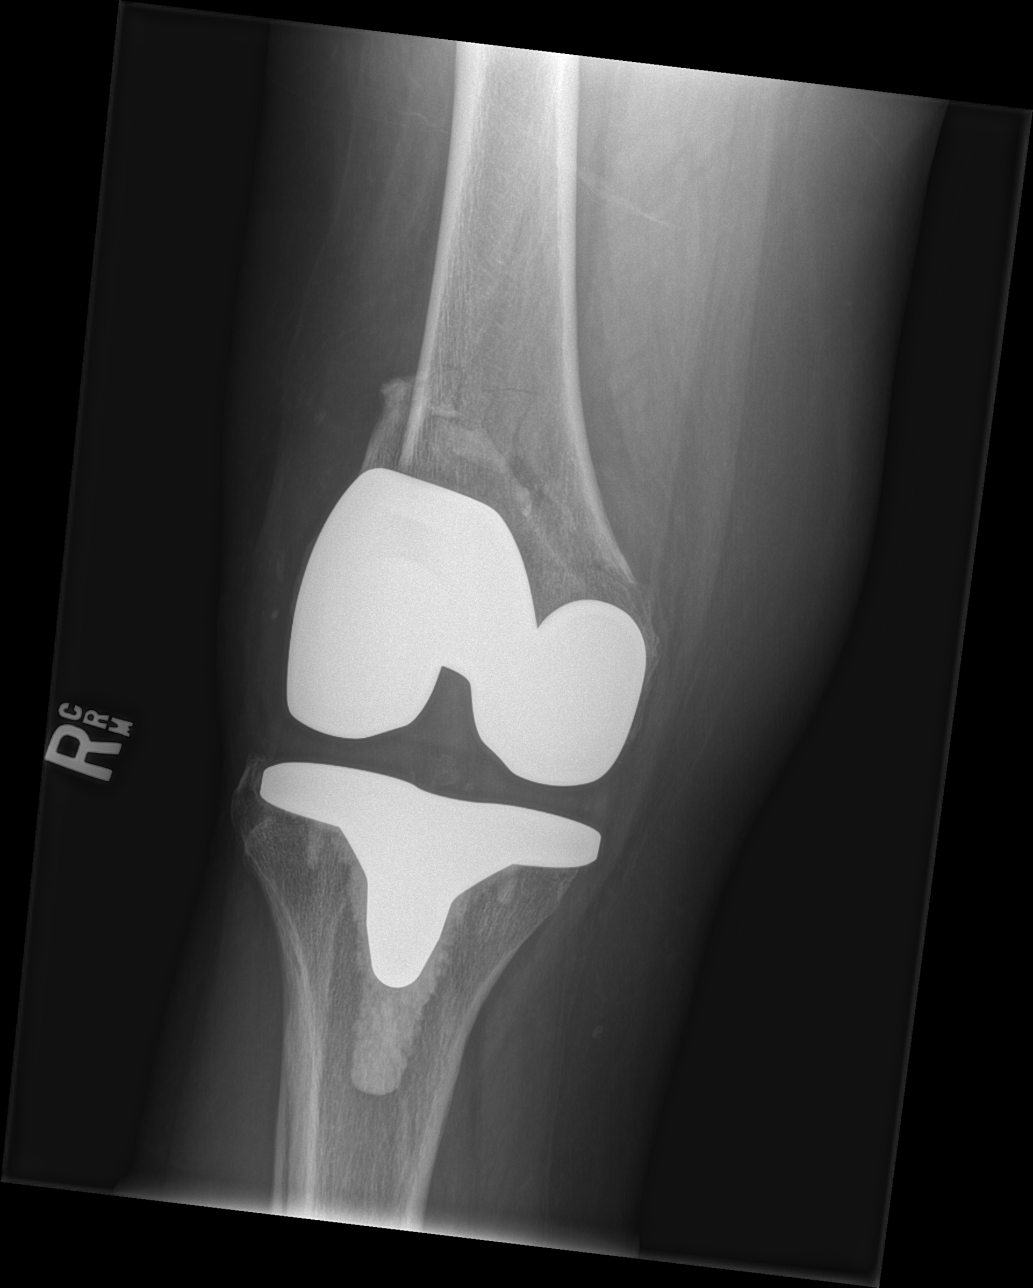

[t knee lat right]
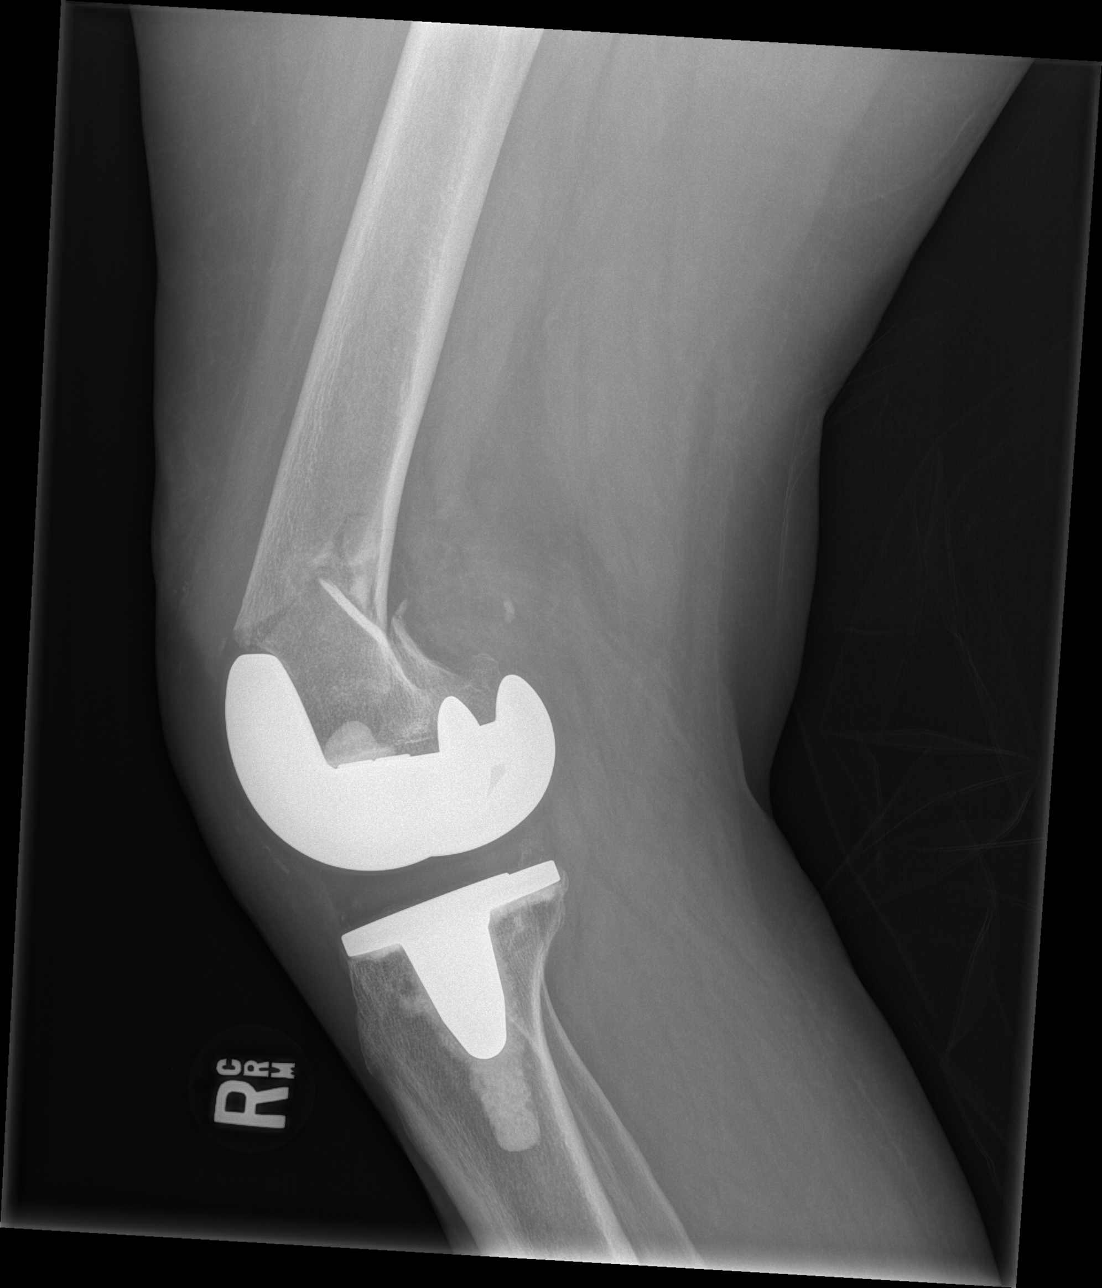

[4 of 4 positions shown; findings below may reference images not displayed]

FINDINGS: Four views of the right knee submitted. There is right knee
prosthesis with anatomic alignment. Mild displaced minimal angulated
comminuted fracture in distal right femoral metaphysis above the
level of prosthesis.
IMPRESSION: There is right knee prosthesis with anatomic alignment. Mild
displaced minimal angulated comminuted fracture in distal right
femoral metaphysis above the level of prosthesis.

## 2017-09-30 IMAGING — CR DG CHEST 2V
2 series · 2 of 2 positions shown · non-contrast
Comparison: 07/25/2015

CLINICAL DATA: Preop evaluation for broken right femur

EXAM:
CHEST  2 VIEW

[chest lat]
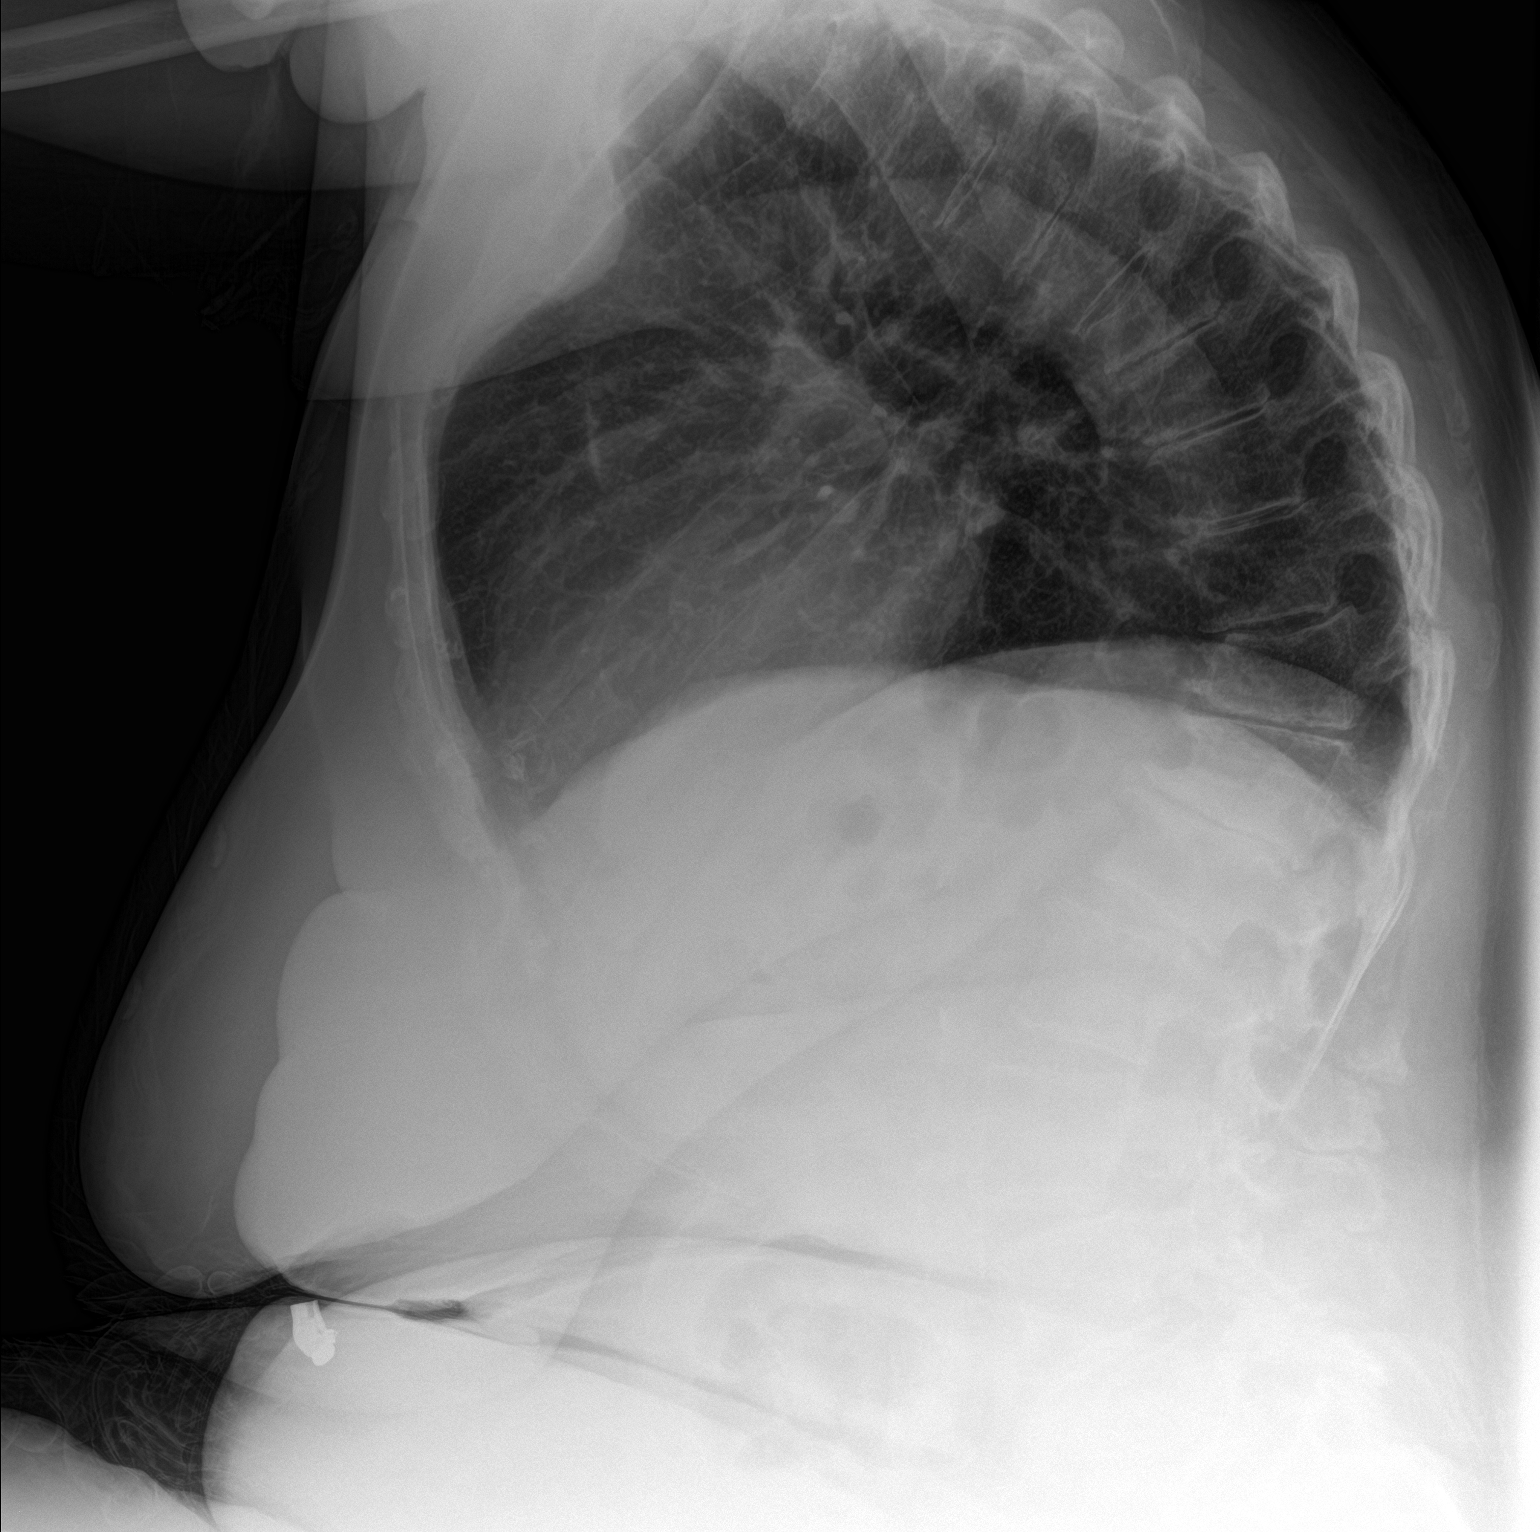

[chest ap]
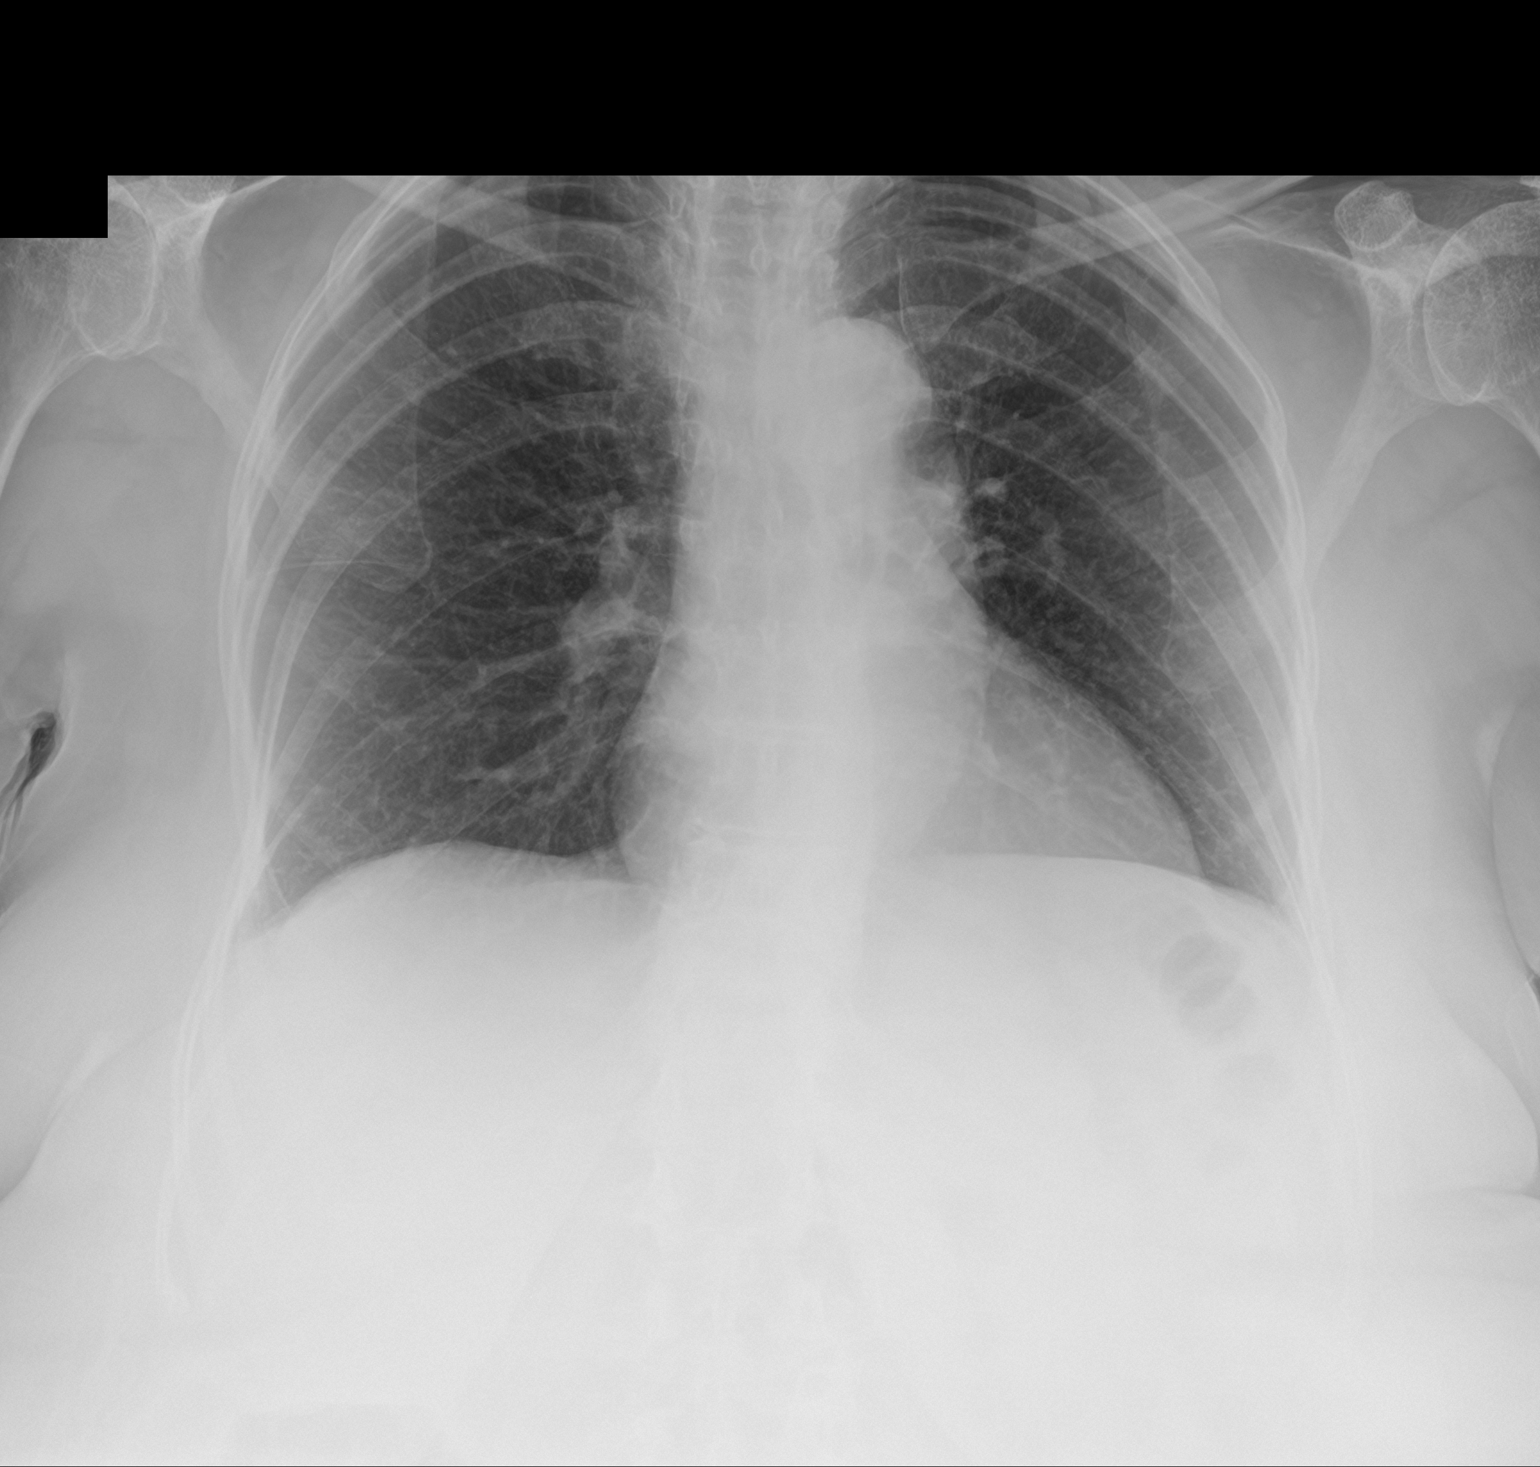

[2 of 2 positions shown; findings below may reference images not displayed]

FINDINGS: The heart size and mediastinal contours are within normal limits.
Both lungs are clear. The visualized skeletal structures shows
stable degenerative changes of the thoracic spine with increase
kyphosis.
IMPRESSION: No active cardiopulmonary disease.

## 2017-10-01 IMAGING — RF DG C-ARM 61-120 MIN
1 series · 1 of 1 positions shown · non-contrast
Comparison: February 02, 2016

FLUOROSCOPY TIME:  1 minutes 49 seconds; 1 acquired image

CLINICAL DATA: Open reduction internal fixation for distal femur
fracture

EXAM:
DG C-ARM 61-120 MIN; RIGHT KNEE - 1-2 VIEW

[Series 1: run · 1 of 1 slices shown]
[im 1/1]
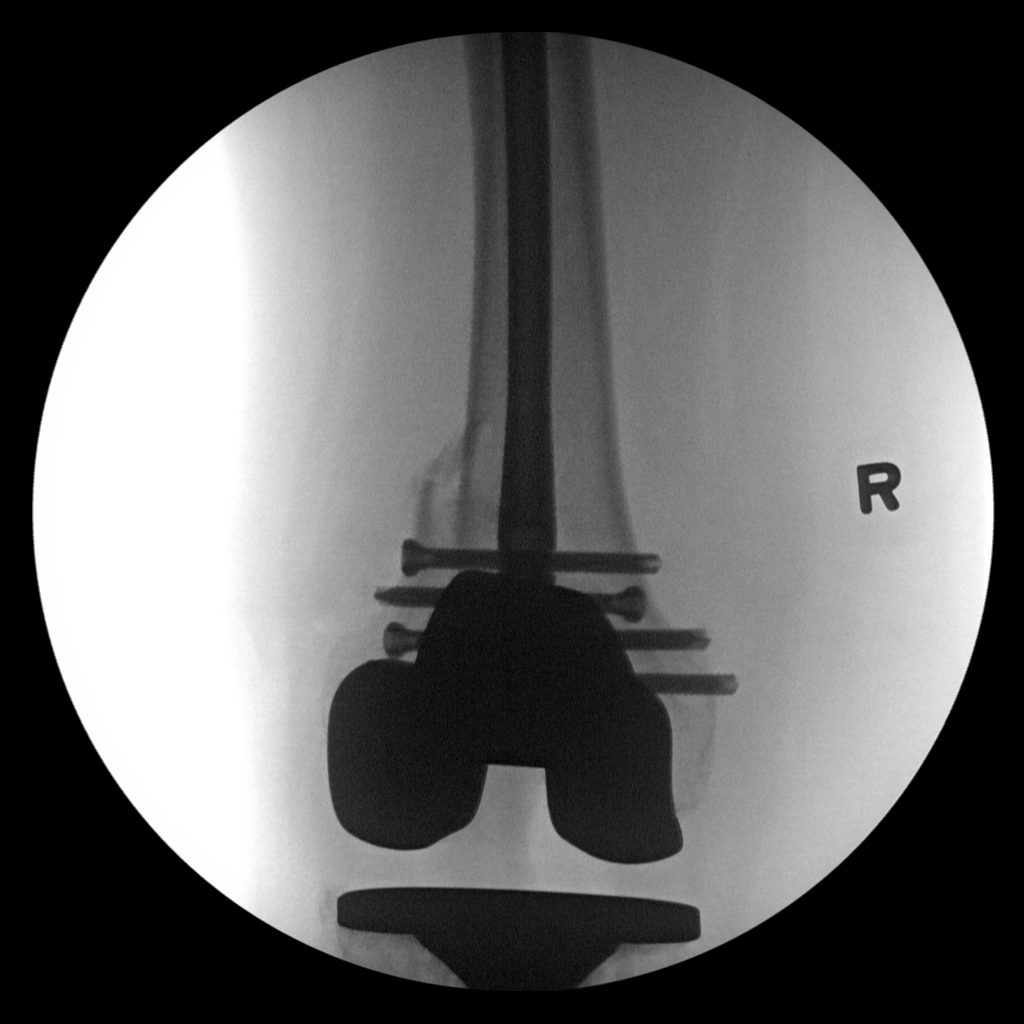

[1 of 1 positions shown; findings below may reference images not displayed]

FINDINGS: Frontal image shows screw and plate fixation through a fracture of
the distal femur with alignment near anatomic on this single frontal
view. Total knee replacement is noted with the visualized femoral
and tibial prosthetic components well-seated. No dislocation
evident.
IMPRESSION: Screw and rod fixation through distal femur fracture with alignment
near anatomic on frontal view. Total knee replacement prostheses
appear well seated on frontal view.

## 2017-12-11 ENCOUNTER — Telehealth: Payer: Self-pay | Admitting: Gynecology

## 2017-12-11 NOTE — Telephone Encounter (Signed)
Relast infusion due 11/23/17, patient needs new insurance card to verify benefits. Pt is changing provider to Dr Seymour BarsLavoie. I will ok reclast through Dr Mackey BirchwoodML and also pt is seeing PCP on 12/15/17 and would like to have labs done at their office to save money and a trip to our office. I will ok Calcium and Creatinine with Dr Mackey BirchwoodML.  Ann MakiSone will bring in insurance card to office.  This message is also sent to KAB.

## 2017-12-13 NOTE — Telephone Encounter (Signed)
Agree.   Previous Messages    ----- Message -----  From: Marijo SanesFalls, Fransico Sciandra W  Sent: 12/11/2017  2:09 PM  To: Genia DelMarie-Lyne Lavoie, MD, Daryll BrodKimalexis Bonham, [place]  Subject: reclast                      Dr Seymour BarsLavoie Ms Alfonse FlavorsCosby is due for reclast was due 11/24/17, she is not due for complete here until 02/2018, she is a pt of Dr Ralene Okoy Moreira and has an appointment with him this week for annual exam with lab work.   I first wanted the ok for Reclast, also the ok for lab work. I did route her telephone encounter to you. I have called PCP and they can do the labwork there and send to us when received.   We will need to check her insurance benefits for Reclast.   Thanks,  Pam      Note from Dr Seymour BarsLavoie agree , lab work request faxed to PCP

## 2017-12-20 NOTE — Telephone Encounter (Signed)
[facility] , CALL ref  # [phone]  PA required for J3489 per Rose   No PA required per ref call #  [phone]    Ref call # [id number]  PA not required however Pre Determination Unit must decide if J3489 with dx code M81.0 is a qualified benefit for patient. Letter send with copy of records and bone density to PD Unit.  Fax  [phone] per instructions of Shelly S.   Talked with [name]  She is having cataract surgery April 11.

## 2017-12-26 NOTE — Telephone Encounter (Signed)
PC to pt she has not received any information from [employer] , we will call next week to insurance in not received ok for Reclast.

## 2017-12-28 NOTE — Telephone Encounter (Addendum)
Provider approval Ref # [id number]. [employer] Medicare follows Medicare guideline allowable . [employer] 80% Patient cost 20%  Letter of [facility] to [facility] sent for [employer]>    Chris Ref  # [id number]

## 2018-01-02 NOTE — Telephone Encounter (Addendum)
  [facility] Calcium level 10.3 Creatinine  0.8  [facility]  01/09/18 at 10 am  Approval by insurance [employer] note below Instructions mailed.

## 2018-01-04 ENCOUNTER — Encounter: Payer: Self-pay | Admitting: Obstetrics & Gynecology

## 2018-01-08 ENCOUNTER — Other Ambulatory Visit (HOSPITAL_COMMUNITY): Payer: Self-pay | Admitting: *Deleted

## 2018-01-09 ENCOUNTER — Ambulatory Visit (HOSPITAL_COMMUNITY)
Admission: RE | Admit: 2018-01-09 | Discharge: 2018-01-09 | Disposition: A | Discharge status: Home / Self Care | Payer: Medicare HMO | Source: Ambulatory Visit | Attending: Obstetrics & Gynecology | Admitting: Obstetrics & Gynecology

## 2018-01-09 DIAGNOSIS — M810 Age-related osteoporosis without current pathological fracture: Secondary | ICD-10-CM | POA: Diagnosis present

## 2018-01-09 MED ORDER — ZOLEDRONIC ACID 5 MG/100ML IV SOLN: 5.0000 mg | Freq: Once | INTRAVENOUS | Status: DC

## 2018-01-09 MED ORDER — ZOLEDRONIC ACID 5 MG/100ML IV SOLN
INTRAVENOUS | Status: AC
  Administered 2018-01-09: 5 mg
  Filled 2018-01-09: qty 100

## 2018-01-10 NOTE — Telephone Encounter (Signed)
RECLAST given 01/09/18.    Next infusion due after 01/11/19 check on  order of provider for 2020

## 2018-01-23 ENCOUNTER — Ambulatory Visit: Payer: Medicare HMO

## 2018-01-23 ENCOUNTER — Encounter: Payer: Self-pay | Admitting: Podiatry

## 2018-01-23 ENCOUNTER — Ambulatory Visit (INDEPENDENT_AMBULATORY_CARE_PROVIDER_SITE_OTHER): Payer: Medicare HMO | Admitting: Podiatry

## 2018-01-23 DIAGNOSIS — M79676 Pain in unspecified toe(s): Secondary | ICD-10-CM

## 2018-01-23 DIAGNOSIS — B351 Tinea unguium: Secondary | ICD-10-CM | POA: Diagnosis not present

## 2018-01-23 DIAGNOSIS — Q828 Other specified congenital malformations of skin: Secondary | ICD-10-CM | POA: Diagnosis not present

## 2018-01-23 DIAGNOSIS — M79671 Pain in right foot: Secondary | ICD-10-CM

## 2018-01-23 DIAGNOSIS — M79672 Pain in left foot: Principal | ICD-10-CM

## 2018-01-23 NOTE — Progress Notes (Signed)
Subjective:  Patient ID: Doris Pacheco, female    DOB: [birth year],  MRN: [id number] HPI Chief Complaint  Patient presents with  . Debridement    Requesting nail care - callused areas sub 1st MPJ left and 2nd toe right - says left foot toes are drawn due to clipping a nerve in hip? surgery  . New Patient (Initial Visit)    77 y.o. female presents with the above complaint.   Review of systems: Denies fever chills nausea vomiting muscle aches pains calf pain back pain chest pain.  Past Medical History:  Diagnosis Date  . Anxiety   . Arthritis   . GERD (gastroesophageal reflux disease)   . History of seasonal allergies   . Hypertension    takes meds daily  . Stroke [facility])    tia   Past Surgical History:  Procedure Laterality Date  . ABDOMINAL HYSTERECTOMY    . I&D KNEE WITH POLY EXCHANGE Right 10/26/2012   Procedure: IRRIGATION AND DEBRIDEMENT KNEE WITH POLY EXCHANGE;  Surgeon: Nestor Lewandowsky, MD;  Location: [facility] OR;  Service: Orthopedics;  Laterality: Right;  poly exchange  . INTRAOCULAR LENS INSERTION     right eye  . JOINT REPLACEMENT     left hip, right knee  . ORIF FEMUR FRACTURE Right 02/03/2016   Procedure: OPEN REDUCTION INTERNAL FIXATION (ORIF) DISTAL FEMUR FRACTURE;  Surgeon: Gean Birchwood, MD;  Location: [facility] OR;  Service: Orthopedics;  Laterality: Right;  . PATELLECTOMY Right 10/26/2012   Procedure: PATELLECTOMY;  Surgeon: Nestor Lewandowsky, MD;  Location[facility] OR;  Service: Orthopedics;  Laterality: Right;  . ROTATOR CUFF REPAIR     bilateral  . TONSILLECTOMY      Current Outpatient Medications:  .  montelukast (SINGULAIR) 10 MG tablet, Take 10 mg by mouth at bedtime., Disp: , Rfl:  .  omeprazole (PRILOSEC) 40 MG capsule, Take 40 mg by mouth daily., Disp: , Rfl:  .  potassium chloride (K-DUR,KLOR-CON) 10 MEQ tablet, Take 10 mEq by mouth 2 (two) times daily., Disp: , Rfl:  .  valsartan (DIOVAN) 320 MG tablet, Take 320 mg by mouth daily., Disp: , Rfl:  .  atorvastatin (LIPITOR)  20 MG tablet, Take 20 mg by mouth daily., Disp: , Rfl:  .  calcium carbonate (OSCAL) 1500 (600 Ca) MG TABS tablet, Take 1,500 mg by mouth daily with breakfast., Disp: , Rfl:  .  estrogens, conjugated, (PREMARIN) 1.25 MG tablet, Take 1.25 mg by mouth daily., Disp: , Rfl:  .  hydrALAZINE (APRESOLINE) 10 MG tablet, Take 10 mg by mouth 3 (three) times daily., Disp: , Rfl:  .  Meth-Hyo-M Bl-Na Phos-Ph Sal (URIBEL) 118 MG CAPS, Take 118 mg by mouth daily. , Disp: , Rfl:  .  Triamterene-HCTZ (MAXZIDE PO), Take 1 tablet by mouth daily., Disp: , Rfl:  .  zoledronic acid (RECLAST) 5 MG/100ML SOLN injection, Inject 5 mg into the vein once., Disp: , Rfl:   Allergies  Allergen Reactions  . Forteo [Teriparatide (Recombinant)] Shortness Of Breath  . Tape Rash    Paper tape OK   Review of Systems Objective:  There were no vitals filed for this visit.  General: Well developed, nourished, in no acute distress, alert and oriented x3   Dermatological: Skin is warm, dry and supple bilateral. Nails x 10 are well maintained; remaining integument appears unremarkable at this time. There are no open sores, no preulcerative lesions, no rash or signs of infection present.  Vascular: Dorsalis Pedis artery and Posterior  Tibial artery pedal pulses are 2/4 bilateral with immedate capillary fill time. Pedal hair growth present. No varicosities and no lower extremity edema present bilateral.   Neruologic: Grossly intact via light touch bilateral. Vibratory intact via tuning fork bilateral. Protective threshold with Semmes Wienstein monofilament intact to all pedal sites bilateral. Patellar and Achilles deep tendon reflexes 2+ bilateral. No Babinski or clonus noted bilateral.   Musculoskeletal: No gross boney pedal deformities bilateral. No pain, crepitus, or limitation noted with foot and ankle range of motion bilateral. Muscular strength 5/5 in all groups tested bilateral.  Gait: Unassisted, Nonantalgic.     Radiographs:  None taken  Assessment & Plan:   Assessment: Severe hammertoe deformity resulting in metatarsalgia and capsulitis with porokeratotic lesions plantar aspect of the forefoot bilateral.  Toenails are thick yellow dystrophic-like mycotic sharply incurvated painful.  Plan: Debridement of all reactive hyperkeratosis.  Debridement of toenails 1 through 5.  Dispensed silicone pads.  Follow-up with her in 2 to 3 months.     Nyeemah Jennette T. [place], [place]

## 2018-01-25 ENCOUNTER — Telehealth: Payer: Self-pay | Admitting: *Deleted

## 2018-01-25 NOTE — Telephone Encounter (Signed)
Pt states her dtr lost her Univeral gel strap and would like to replace it.

## 2018-01-25 NOTE — Telephone Encounter (Signed)
I informed pt the Universal Gel Strap bunion cover was $12.50 +tax. Pt states she will come by to purchase one.

## 2018-01-26 ENCOUNTER — Encounter: Payer: Self-pay | Admitting: Gynecology

## 2018-03-06 IMAGING — DX DG CHEST 2V
2 series · 2 of 2 positions shown · non-contrast
Comparison: Prior radiograph from 02/02/2016.

CLINICAL DATA: Initial evaluation for acute chest pain.

EXAM:
CHEST  2 VIEW

[chest pa]
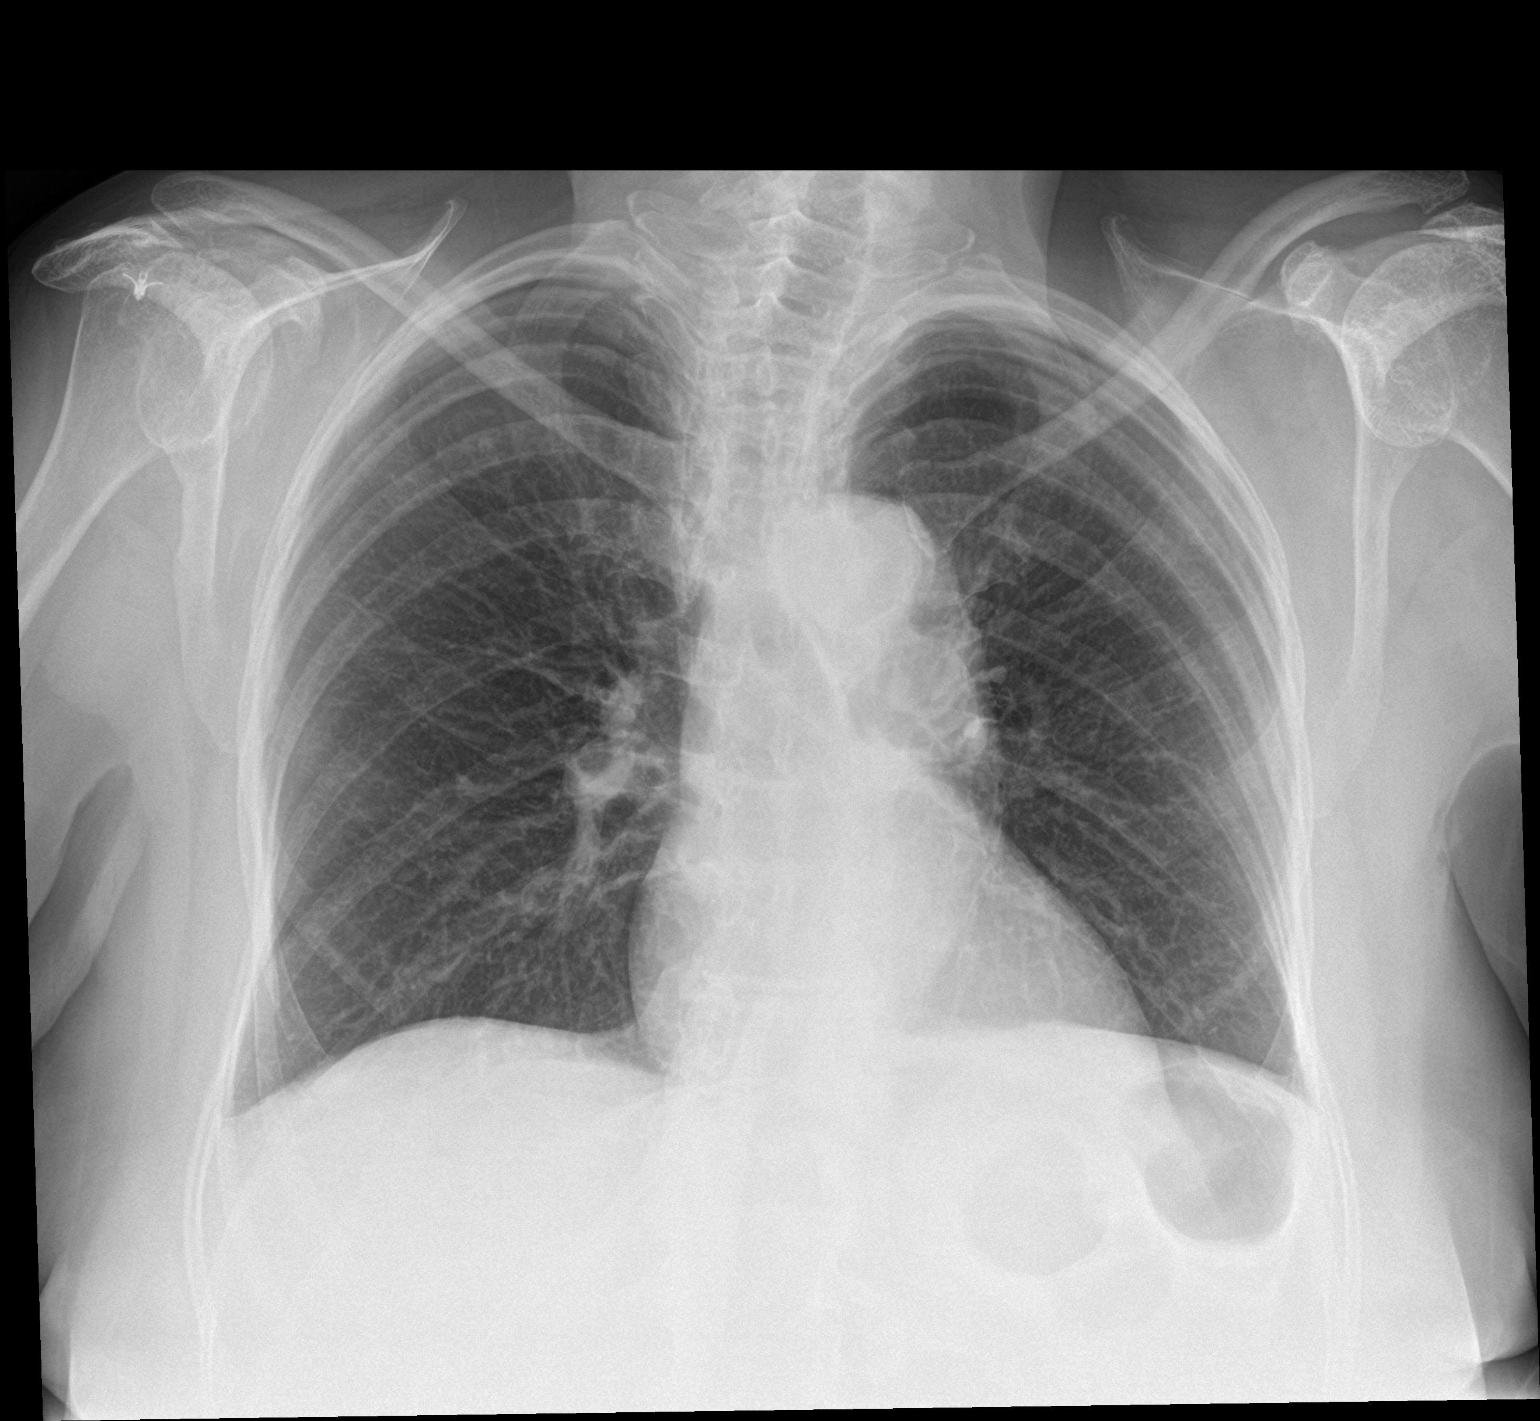

[chest lat]
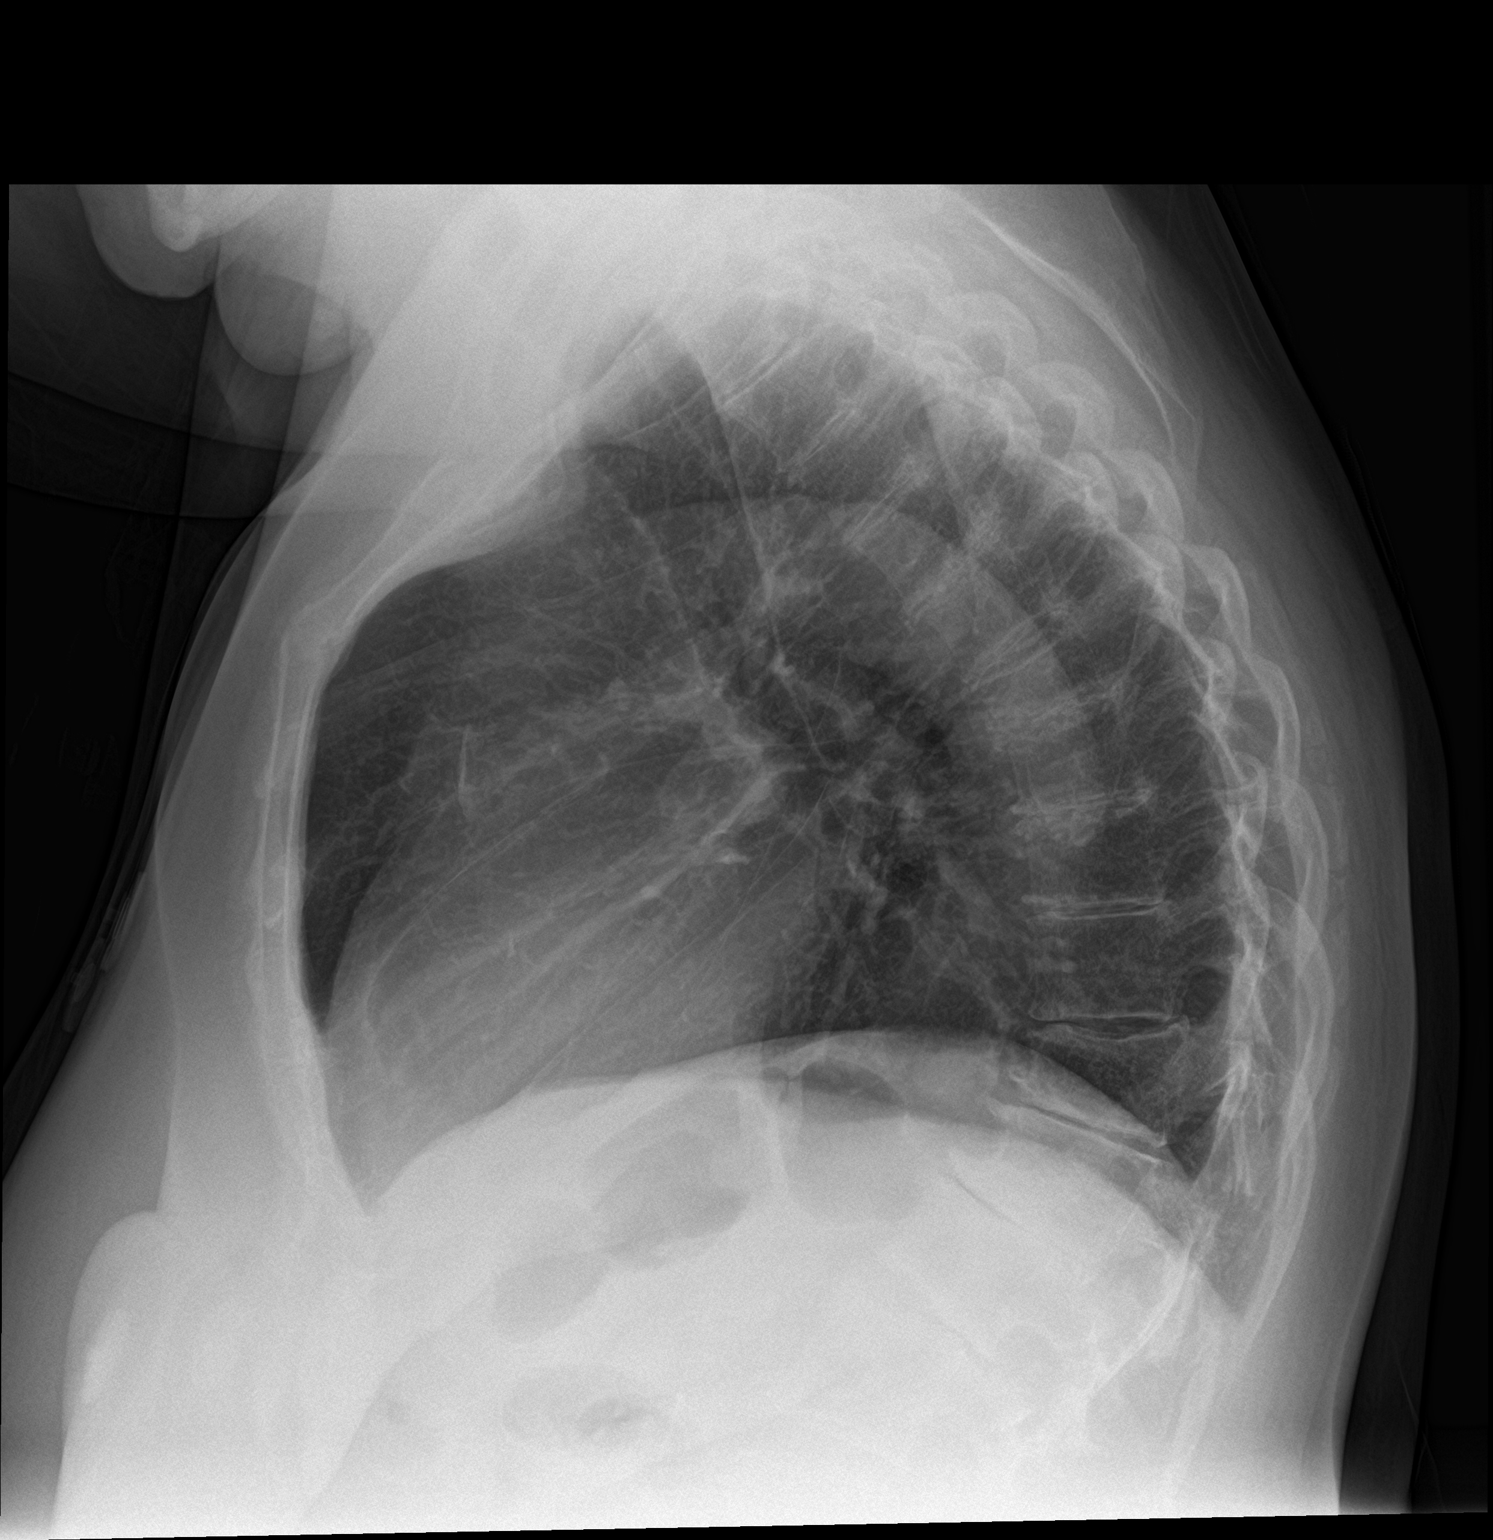

[2 of 2 positions shown; findings below may reference images not displayed]

FINDINGS: Cardiac and mediastinal silhouettes are stable in size and contour,
and remain within normal limits. Atheromatous plaque noted within
the aortic arch. Tortuosity the intrathoracic aorta noted.

Lungs mildly hypoinflated with elevation left hemidiaphragm, stable.
No focal infiltrates, pulmonary edema, or pleural effusion. No
pneumothorax.

No acute osseus abnormality. Mild vertebral body wedging within the
lower thoracic spine is stable. Sequela prior rotator cuff repair
noted at the right shoulder.
IMPRESSION: 1. No active cardiopulmonary disease.
2. Mild elevation left hemidiaphragm, stable from prior, and likely
chronic.
3. Aortic atherosclerosis.

## 2018-03-08 ENCOUNTER — Ambulatory Visit (INDEPENDENT_AMBULATORY_CARE_PROVIDER_SITE_OTHER): Payer: Medicare HMO | Admitting: Obstetrics & Gynecology

## 2018-03-08 ENCOUNTER — Encounter: Payer: Self-pay | Admitting: Obstetrics & Gynecology

## 2018-03-08 VITALS — BP 142/80 | Ht 64.0 in | Wt 164.0 lb

## 2018-03-08 DIAGNOSIS — B3731 Acute candidiasis of vulva and vagina: Secondary | ICD-10-CM

## 2018-03-08 DIAGNOSIS — Z90710 Acquired absence of both cervix and uterus: Secondary | ICD-10-CM

## 2018-03-08 DIAGNOSIS — Z01419 Encounter for gynecological examination (general) (routine) without abnormal findings: Secondary | ICD-10-CM

## 2018-03-08 DIAGNOSIS — Z780 Asymptomatic menopausal state: Secondary | ICD-10-CM | POA: Diagnosis not present

## 2018-03-08 DIAGNOSIS — B373 Candidiasis of vulva and vagina: Secondary | ICD-10-CM | POA: Diagnosis not present

## 2018-03-08 DIAGNOSIS — M810 Age-related osteoporosis without current pathological fracture: Secondary | ICD-10-CM | POA: Diagnosis not present

## 2018-03-08 MED ORDER — FLUCONAZOLE 150 MG PO TABS: 150.0000 mg | ORAL_TABLET | Freq: Every day | ORAL | 4 refills | Status: AC

## 2018-03-08 NOTE — Patient Instructions (Signed)
1. Well female exam with routine gynecological exam Gynecologic exam status post TAH and menopause.  History of normal Pap tests, no need for further Paps.  Breast exam normal.  [facility], will obtain report.  Fasting health labs scheduled today with her family physician.  Colonoscopy done in 2019.  2. S/P TAH (total abdominal hysterectomy)  3. Yeast vaginitis Tendency for recurrent yeast vaginitis.  Treats successfully with 1 fluconazole monthly.  No contraindication to fluconazole.  Prescription of fluconazole sent to pharmacy.  4. Menopause present Well on no HRT.  5. Age-related osteoporosis without current pathological fracture Last BD 03/2017 normal on Reclast annually for h/o fragility fracture.  Other orders - fluconazole (DIFLUCAN) 150 MG tablet; Take 1 tablet (150 mg total) by mouth daily for 3 days.  [name], it was a pleasure meeting you today!

## 2018-03-08 NOTE — Progress Notes (Signed)
Doris Pacheco 02-12-1941 [id number]   History:    77 y.o. G3P3L3 Widowed.  Many grand and great-grand-children.  RP:  Established patient presenting for annual gyn exam   HPI: S/P TAH.  Menopause, well on no HRT.  No pelvic pain.  H/O Fragility fracture on Reclast.  Urine normal.  Constipation.  Breasts wnl.  BMI 28.15.  Walks with a walker.  Health labs with Fam MD scheduled today.    Past medical history,surgical history, family history and social history were all reviewed and documented in the EPIC chart.  Gynecologic History No LMP recorded. Patient is postmenopausal. Contraception: status post hysterectomy Last Pap: Always had normal paps.   Last mammogram: 2018. Results were: normal per [facility] at [facility].  Will obtain report. Bone Density: 03/2017 Normal Colonoscopy: 2019  Obstetric History OB History  Gravida Para Term Preterm AB Living  3 3       3   SAB TAB Ectopic Multiple Live Births               # Outcome Date GA Lbr Len/2nd Weight Sex Delivery Anes PTL Lv  3 Para           2 Para           1 Para              ROS: A ROS was performed and pertinent positives and negatives are included in the history.  GENERAL: No fevers or chills. HEENT: No change in vision, no earache, sore throat or sinus congestion. NECK: No pain or stiffness. CARDIOVASCULAR: No chest pain or pressure. No palpitations. PULMONARY: No shortness of breath, cough or wheeze. GASTROINTESTINAL: No abdominal pain, nausea, vomiting or diarrhea, melena or bright red blood per rectum. GENITOURINARY: No urinary frequency, urgency, hesitancy or dysuria. MUSCULOSKELETAL: No joint or muscle pain, no back pain, no recent trauma. DERMATOLOGIC: No rash, no itching, no lesions. ENDOCRINE: No polyuria, polydipsia, no heat or cold intolerance. No recent change in weight. HEMATOLOGICAL: No anemia or easy bruising or bleeding. NEUROLOGIC: No headache, seizures, numbness, tingling or weakness. PSYCHIATRIC: No depression,  no loss of interest in normal activity or change in sleep pattern.     Exam:   Ht 5\' 4"  (1.626 m)   Wt 164 lb (74.4 kg)   BMI 28.15 kg/m   Body mass index is 28.15 kg/m.  General appearance : Well developed well nourished female. No acute distress HEENT: Eyes: no retinal hemorrhage or exudates,  Neck supple, trachea midline, no carotid bruits, no thyroidmegaly Lungs: Clear to auscultation, no rhonchi or wheezes, or rib retractions  Heart: Regular rate and rhythm, no murmurs or gallops Breast:Examined in sitting and supine position were symmetrical in appearance, no palpable masses or tenderness,  no skin retraction, no nipple inversion, no nipple discharge, no skin discoloration, no axillary or supraclavicular lymphadenopathy Abdomen: no palpable masses or tenderness, no rebound or guarding Extremities: no edema or skin discoloration or tenderness  Pelvic: Vulva: Normal             Vagina: No gross lesions or discharge  Cervix/Uterus absent  Adnexa  Without masses or tenderness  Anus: Normal   Assessment/Plan:  77 y.o. female for annual exam   1. Well female exam with routine gynecological exam Gynecologic exam status post TAH and menopause.  History of normal Pap tests, no need for further Paps.  Breast exam normal.  Last screening mammogram in 2018 at [facility], will obtain report.  Fasting health  labs scheduled today with her family physician.  Colonoscopy done in 2019.  2. S/P TAH (total abdominal hysterectomy)  3. Yeast vaginitis Tendency for recurrent yeast vaginitis.  Treats successfully with 1 fluconazole monthly.  No contraindication to fluconazole.  Prescription of fluconazole sent to pharmacy.  4. Menopause present Well on no HRT.  5. Age-related osteoporosis without current pathological fracture Last BD 03/2017 normal on Reclast annually for h/o fragility fracture.  Other orders - fluconazole (DIFLUCAN) 150 MG tablet; Take 1 tablet (150 mg total) by mouth daily  for 3 days.  Counseling on above issues and coordination of care more than 50% for 10 minutes.  [name] MD, 11:09 AM 03/08/2018

## 2018-04-03 ENCOUNTER — Encounter: Payer: Self-pay | Admitting: Podiatry

## 2018-04-03 ENCOUNTER — Ambulatory Visit (INDEPENDENT_AMBULATORY_CARE_PROVIDER_SITE_OTHER): Payer: Medicare HMO | Admitting: Podiatry

## 2018-04-03 DIAGNOSIS — M79676 Pain in unspecified toe(s): Secondary | ICD-10-CM

## 2018-04-03 DIAGNOSIS — B351 Tinea unguium: Secondary | ICD-10-CM

## 2018-04-03 DIAGNOSIS — Q828 Other specified congenital malformations of skin: Secondary | ICD-10-CM | POA: Diagnosis not present

## 2018-04-04 NOTE — Progress Notes (Signed)
She presents today chief complaint of painful elongated toenails.  Objective: Nails are long thick yellow dystrophic onychomycotic.  Reactive hyper keratomas plantar aspect of the bilateral foot  Assessment: Pain in limb secondary onychomycosis.  Porokeratosis plantar aspect of bilateral foot.  Plan: Debridement of toenails 1 through 5 bilateral.  Debrided all reactive hyperkeratotic tissue.

## 2018-07-03 ENCOUNTER — Encounter: Payer: Self-pay | Admitting: Podiatry

## 2018-07-03 ENCOUNTER — Ambulatory Visit (INDEPENDENT_AMBULATORY_CARE_PROVIDER_SITE_OTHER): Payer: Medicare HMO | Admitting: Podiatry

## 2018-07-03 DIAGNOSIS — B351 Tinea unguium: Secondary | ICD-10-CM

## 2018-07-03 DIAGNOSIS — M79676 Pain in unspecified toe(s): Secondary | ICD-10-CM

## 2018-07-03 DIAGNOSIS — Q828 Other specified congenital malformations of skin: Secondary | ICD-10-CM

## 2018-07-03 NOTE — Progress Notes (Signed)
She presents today chief complaint of painful elongated toenails with corns and calluses.  Objective: Toenails are long thick yellow dystrophic-like mycotic hammertoe deformities are noted.  No ulcerations are noted.  Single reactive hyperkeratotic lesion sub-first metatarsophalangeal joint no signs of skin breakdown.  Assessment: Pain in limb secondary to pain in limb secondary to onychomycosis and single porokeratotic lesion  Plan: Debridement of toenails 1 through 5 bilateral debridement of reactive hyperkeratotic lesion.

## 2018-08-14 ENCOUNTER — Emergency Department (HOSPITAL_COMMUNITY): Payer: Medicare HMO

## 2018-08-14 ENCOUNTER — Encounter (HOSPITAL_COMMUNITY): Payer: Self-pay | Admitting: Emergency Medicine

## 2018-08-14 ENCOUNTER — Emergency Department (HOSPITAL_COMMUNITY)
Admission: EM | Admit: 2018-08-14 | Discharge: 2018-08-15 | Disposition: A | Discharge status: Home / Self Care | Payer: Medicare HMO | Attending: Emergency Medicine | Admitting: Emergency Medicine

## 2018-08-14 DIAGNOSIS — Z79899 Other long term (current) drug therapy: Secondary | ICD-10-CM | POA: Insufficient documentation

## 2018-08-14 DIAGNOSIS — J209 Acute bronchitis, unspecified: Secondary | ICD-10-CM

## 2018-08-14 DIAGNOSIS — Z8673 Personal history of transient ischemic attack (TIA), and cerebral infarction without residual deficits: Secondary | ICD-10-CM | POA: Insufficient documentation

## 2018-08-14 DIAGNOSIS — E039 Hypothyroidism, unspecified: Secondary | ICD-10-CM | POA: Insufficient documentation

## 2018-08-14 DIAGNOSIS — Z87891 Personal history of nicotine dependence: Secondary | ICD-10-CM | POA: Insufficient documentation

## 2018-08-14 DIAGNOSIS — R0602 Shortness of breath: Secondary | ICD-10-CM | POA: Diagnosis present

## 2018-08-14 DIAGNOSIS — I10 Essential (primary) hypertension: Secondary | ICD-10-CM | POA: Diagnosis not present

## 2018-08-14 NOTE — ED Triage Notes (Signed)
Reports feeling SOB and "hot".  Symptoms started today.  Breathing easy and non labored in triage.  Also reports falling out of the bed and hitting head on nightstand a week ago but did not lose consciousness.  Denies any symptoms from the fall.

## 2018-08-15 DIAGNOSIS — J209 Acute bronchitis, unspecified: Secondary | ICD-10-CM | POA: Diagnosis not present

## 2018-08-15 MED ORDER — IPRATROPIUM-ALBUTEROL 0.5-2.5 (3) MG/3ML IN SOLN
3.0000 mL | Freq: Once | RESPIRATORY_TRACT | Status: AC
  Administered 2018-08-15: 3 mL via RESPIRATORY_TRACT
  Filled 2018-08-15: qty 3

## 2018-08-15 MED ORDER — ALBUTEROL SULFATE HFA 108 (90 BASE) MCG/ACT IN AERS
2.0000 | INHALATION_SPRAY | RESPIRATORY_TRACT | Status: DC | PRN
  Filled 2018-08-15: qty 6.7

## 2018-08-15 MED ORDER — PREDNISONE 20 MG PO TABS
60.0000 mg | ORAL_TABLET | Freq: Once | ORAL | Status: AC
  Administered 2018-08-15: 60 mg via ORAL
  Filled 2018-08-15: qty 3

## 2018-08-15 MED ORDER — PREDNISONE 50 MG PO TABS: 50.0000 mg | ORAL_TABLET | Freq: Every day | ORAL | 0 refills | Status: DC

## 2018-08-15 NOTE — ED Provider Notes (Signed)
[facility] Provider Note   CSN: [id number] Arrival date & time: 08/14/18  2201     History   Chief Complaint Chief Complaint  Patient presents with  . Shortness of Breath    HPI Doris Pacheco is a 77 y.o. female.  The history is provided by the patient.  She has history of hypertension, hyperlipidemia, stroke, GERD and comes in with difficulty breathing today.  She has had a cough which is nonproductive.  She has had some nasal congestion and thinks she may have a cold or sinus infection.  She denies fever chills.  Denies chest pain, heaviness, tightness, pressure.  Nothing makes her dyspnea worse, nothing makes it better.  She has not tried any treatment at home.  Past Medical History:  Diagnosis Date  . Anxiety   . Arthritis   . GERD (gastroesophageal reflux disease)   . History of seasonal allergies   . Hypertension    takes meds daily  . Stroke [facility](HCC)    tia    Patient Active Problem List   Diagnosis Date Noted  . Hypothyroidism 03/02/2016  . Hyperlipidemia 03/02/2016  . Closed fracture of right distal femur (HCC) 02/03/2016  . Abrasion, knee   . Fracture of femur, distal, right, closed (HCC) 02/02/2016  . 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 Anemia 02/02/2016  . Femoral distal fracture, right, closed, initial encounter 02/02/2016  . Essential hypertension   . Fall   . Osteoporosis 02/18/2015  . Vertebral fracture, osteoporotic (HCC) 02/18/2015  . Bilateral low back pain without sciatica 02/18/2015  . Midline low back pain without sciatica 01/08/2015  . Chest pain 07/30/2013  . Hypertension   . Anxiety   . History of seasonal allergies   . History of CVA (cerebrovascular accident)   . GERD (gastroesophageal reflux disease)   . Arthritis   . 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 ABDOMINAL HYSTERECTOMY    . I&D KNEE WITH POLY  EXCHANGE Right 10/26/2012   Procedure: IRRIGATION AND DEBRIDEMENT KNEE WITH POLY EXCHANGE;  Surgeon: Nestor LewandowskyFrank J Rowan, MD;  Location: [facility] OR;  Service: Orthopedics;  Laterality: Right;  poly exchange  . INTRAOCULAR LENS INSERTION     right eye  . JOINT REPLACEMENT     left hip, right knee  . ORIF FEMUR FRACTURE Right 02/03/2016   Procedure: OPEN REDUCTION INTERNAL FIXATION (ORIF) DISTAL FEMUR FRACTURE;  Surgeon: Gean BirchwoodFrank Rowan, MD;  Location: [facility] OR;  Service: Orthopedics;  Laterality: Right;  . PATELLECTOMY Right 10/26/2012   Procedure: PATELLECTOMY;  Surgeon: Nestor LewandowskyFrank J Rowan, MD;  [facility];  Service: Orthopedics;  Laterality: Right;  . ROTATOR CUFF REPAIR     bilateral  . TONSILLECTOMY       OB History    Gravida  3   Para  3   Term      Preterm      AB      Living  3     SAB      TAB      Ectopic      Multiple      Live Births               Home Medications    Prior to Admission medications   Medication Sig Start Date End Date Taking? Authorizing Provider  atorvastatin (LIPITOR) 20 MG tablet Take 20 mg by mouth daily.  [provider]  calcium carbonate (OSCAL) 1500 (600 Ca) MG TABS tablet Take 1,500 mg by mouth daily with breakfast.    [provider]  estrogens, conjugated, (PREMARIN) 1.25 MG tablet Take 1.25 mg by mouth daily.    [provider]  hydrALAZINE (APRESOLINE) 10 MG tablet Take 10 mg by mouth 3 (three) times daily.    [provider]  Meth-Hyo-M Bl-Na Phos-Ph Sal (URIBEL) 118 MG CAPS Take 118 mg by mouth daily.     [provider]  montelukast (SINGULAIR) 10 MG tablet Take 10 mg by mouth at bedtime.    [provider]  omeprazole (PRILOSEC) 40 MG capsule Take 40 mg by mouth daily.    [provider]  potassium chloride (K-DUR,KLOR-CON) 10 MEQ tablet Take 10 mEq by mouth 2 (two) times daily.    [provider]  Triamterene-HCTZ (MAXZIDE PO) Take 1 tablet by mouth daily.     [provider]  valsartan (DIOVAN) 320 MG tablet Take 320 mg by mouth daily.    [provider]  zoledronic acid (RECLAST) 5 MG/100ML SOLN injection Inject 5 mg into the vein once.    [provider]    Family History Family History  Problem Relation Age of Onset  . Cancer Mother        COLON  . Diabetes Sister   . Stroke Sister   . Hypertension Father   . Pneumonia Father     Social History Social History   Tobacco Use  . Smoking status: Former Games developer  . Smokeless tobacco: Never Used  Substance Use Topics  . Alcohol use: No    Alcohol/week: 0.0 standard drinks  . Drug use: No     Allergies   Forteo [teriparatide (recombinant)] and Tape   Review of Systems Review of Systems  All other systems reviewed and are negative.    Physical Exam Updated Vital Signs BP (!) 126/54   Pulse (!) 57   Temp 98.3 F (36.8 C) (Oral)   Resp 12   Ht 5\' 4"  (1.626 m)   Wt 68 kg   SpO2 100%   BMI 25.75 kg/m   Physical Exam  Nursing note and vitals reviewed.  77 year old female, resting comfortably and in no acute distress. Vital signs are significant for borderline low heart rate. Oxygen saturation is 100%, which is normal. Head is normocephalic and atraumatic. PERRLA, EOMI. Oropharynx is clear. Neck is nontender and supple without adenopathy or JVD. Back is nontender and there is no CVA tenderness. Lungs are clear without rales, wheezes, or rhonchi.  Slightly prolonged exhalation phase is noted. Chest is nontender. Heart has regular rate and rhythm without murmur. Abdomen is soft, flat, nontender without masses or hepatosplenomegaly and peristalsis is normoactive. Extremities have trace edema, full range of motion is present. Skin is warm and dry without rash. Neurologic: Mental status is normal, cranial nerves are intact, there are no motor or sensory deficits.  ED Treatments / Results   EKG EKG Interpretation  Date/Time:  Tuesday August 14 2018 22:07:43 EST Ventricular Rate:  76 PR Interval:  170 QRS Duration: 80 QT Interval:  358 QTC Calculation: 402 R Axis:   45 Text Interpretation:  Normal sinus rhythm Normal ECG When compared with ECG of 07/08/2016, No significant change was found Confirmed by Dione Booze ([id number]) on 08/15/2018 12:25:51 AM   Radiology Dg Chest 2 View  Result Date: 08/14/2018 CLINICAL DATA:  Shortness of breath EXAM: CHEST - 2  VIEW COMPARISON:  07/08/2016 FINDINGS: No focal opacity or pleural effusion. The heart size is within normal limits. Tortuous aorta. No pneumothorax. Multiple chronic compression deformities of the spine. IMPRESSION: No active cardiopulmonary disease. Electronically Signed   By: Jasmine Pang M.D.   On: 08/14/2018 22:41    Procedures Procedures   Medications Ordered in ED Medications  predniSONE (DELTASONE) tablet 60 mg (has no administration in time range)  albuterol (PROVENTIL HFA;VENTOLIN HFA) 108 (90 Base) MCG/ACT inhaler 2 puff (has no administration in time range)  ipratropium-albuterol (DUONEB) 0.5-2.5 (3) MG/3ML nebulizer solution 3 mL (3 mLs Nebulization Given 08/15/18 0149)     Initial Impression / Assessment and Plan / ED Course  I have reviewed the triage vital signs and the nursing notes.  Pertinent imaging results that were available during my care of the patient were reviewed by me and considered in my medical decision making (see chart for details).  Dyspnea with cough-probable bronchitis.  Normal pulse ox on room air, chest x-ray shows no evidence of pneumonia.  ECG is normal.  Will give therapeutic trial of albuterol with ipratropium.  She noted considerable improvement with above-noted treatment.  She is given an albuterol inhaler to take home.  She is given a dose of prednisone and is discharged with prescription for same.  Follow-up with PCP in 5 days.  Return precautions discussed.  Final Clinical Impressions(s) / ED Diagnoses   Final diagnoses:    Acute bronchitis, unspecified organism    ED Discharge Orders         Ordered    predniSONE (DELTASONE) 50 MG tablet  Daily,   Status:  Discontinued     08/15/18 0216    predniSONE (DELTASONE) 50 MG tablet  Daily     08/15/18 0216           Dione Booze, MD 08/15/18 [phone]

## 2018-08-15 NOTE — ED Notes (Signed)
Reviewed d/c instructions with pt, who verbalized understanding and had no outstanding questions. Pt departed in NAD.   

## 2018-08-15 NOTE — Discharge Instructions (Signed)
Use the inhaler for trouble breathing. Take two puffs every four hours as needed. Return if symptoms are getting worse.

## 2018-08-15 NOTE — ED Notes (Signed)
Provental HFA given to patient, she returned knowledge of use.

## 2018-10-04 ENCOUNTER — Encounter: Payer: Self-pay | Admitting: Podiatry

## 2018-10-04 ENCOUNTER — Ambulatory Visit: Payer: Medicare HMO | Admitting: Podiatry

## 2018-10-04 ENCOUNTER — Ambulatory Visit (INDEPENDENT_AMBULATORY_CARE_PROVIDER_SITE_OTHER): Payer: Medicare HMO | Admitting: Podiatry

## 2018-10-04 DIAGNOSIS — Q828 Other specified congenital malformations of skin: Secondary | ICD-10-CM | POA: Diagnosis not present

## 2018-10-04 DIAGNOSIS — M79676 Pain in unspecified toe(s): Secondary | ICD-10-CM | POA: Diagnosis not present

## 2018-10-04 DIAGNOSIS — B351 Tinea unguium: Secondary | ICD-10-CM

## 2018-10-04 NOTE — Progress Notes (Signed)
She presents today chief complaint of painful elongated toenails and calluses.  Objective: Toenails are long thick yellow dystrophic-like mycotic painful palpation.  Reactive hyper keratomas plantar aspect of the bilateral foot.  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  Plan: Debridement toenails 1 through 5 bilateral.  Debridement of all reactive hyperkeratotic tissue.

## 2019-01-02 ENCOUNTER — Ambulatory Visit: Payer: Medicare HMO | Admitting: Podiatry

## 2019-02-08 ENCOUNTER — Ambulatory Visit: Payer: Medicare HMO | Admitting: Podiatry

## 2019-06-27 ENCOUNTER — Encounter: Payer: Self-pay | Admitting: Gynecology

## 2020-02-18 ENCOUNTER — Ambulatory Visit (INDEPENDENT_AMBULATORY_CARE_PROVIDER_SITE_OTHER): Payer: Medicare HMO | Admitting: Podiatry

## 2020-02-18 ENCOUNTER — Other Ambulatory Visit: Payer: Self-pay

## 2020-02-18 ENCOUNTER — Encounter: Payer: Self-pay | Admitting: Podiatry

## 2020-02-18 VITALS — Temp 97.9°F

## 2020-02-18 DIAGNOSIS — L853 Xerosis cutis: Secondary | ICD-10-CM

## 2020-02-18 DIAGNOSIS — M79672 Pain in left foot: Secondary | ICD-10-CM | POA: Diagnosis not present

## 2020-02-18 DIAGNOSIS — B351 Tinea unguium: Secondary | ICD-10-CM | POA: Diagnosis not present

## 2020-02-18 DIAGNOSIS — M79676 Pain in unspecified toe(s): Secondary | ICD-10-CM

## 2020-02-18 DIAGNOSIS — M79671 Pain in right foot: Secondary | ICD-10-CM

## 2020-02-18 DIAGNOSIS — L84 Corns and callosities: Secondary | ICD-10-CM

## 2020-02-18 MED ORDER — AMMONIUM LACTATE 12 % EX LOTN: 1.0000 "application " | TOPICAL_LOTION | CUTANEOUS | 2 refills | Status: DC | PRN

## 2020-02-18 NOTE — Patient Instructions (Addendum)
Corns and Calluses Corns are small areas of thickened skin that occur on the top, sides, or tip of a toe. They contain a cone-shaped core with a point that can press on a nerve below. This causes pain.  Calluses are areas of thickened skin that can occur anywhere on the body, including the hands, fingers, palms, soles of the feet, and heels. Calluses are usually larger than corns. What are the causes? Corns and calluses are caused by rubbing (friction) or pressure, such as from shoes that are too tight or do not fit properly. What increases the risk? Corns are more likely to develop in people who have misshapen toes (toe deformities), such as hammer toes. Calluses can occur with friction to any area of the skin. They are more likely to develop in people who:  Work with their hands.  Wear shoes that fit poorly, are too tight, or are high-heeled.  Have toe deformities. What are the signs or symptoms? Symptoms of a corn or callus include:  A hard growth on the skin.  Pain or tenderness under the skin.  Redness and swelling.  Increased discomfort while wearing tight-fitting shoes, if your feet are affected. If a corn or callus becomes infected, symptoms may include:  Redness and swelling that gets worse.  Pain.  Fluid, blood, or pus draining from the corn or callus. How is this diagnosed? Corns and calluses may be diagnosed based on your symptoms, your medical history, and a physical exam. How is this treated? Treatment for corns and calluses may include:  Removing the cause of the friction or pressure. This may involve: ? Changing your shoes. ? Wearing shoe inserts (orthotics) or other protective layers in your shoes, such as a corn pad. ? Wearing gloves.  Applying medicine to the skin (topical medicine) to help soften skin in the hardened, thickened areas.  Removing layers of dead skin with a file to reduce the size of the corn or callus.  Removing the corn or callus with a  scalpel or laser.  Taking antibiotic medicines, if your corn or callus is infected.  Having surgery, if a toe deformity is the cause. Follow these instructions at home:   Take over-the-counter and prescription medicines only as told by your health care provider.  If you were prescribed an antibiotic, take it as told by your health care provider. Do not stop taking it even if your condition starts to improve.  Wear shoes that fit well. Avoid wearing high-heeled shoes and shoes that are too tight or too loose.  Wear any padding, protective layers, gloves, or orthotics as told by your health care provider.  Soak your hands or feet and then use a file or pumice stone to soften your corn or callus. Do this as told by your health care provider.  Check your corn or callus every day for symptoms of infection. Contact a health care provider if you:  Notice that your symptoms do not improve with treatment.  Have redness or swelling that gets worse.  Notice that your corn or callus becomes painful.  Have fluid, blood, or pus coming from your corn or callus.  Have new symptoms. Summary  Corns are small areas of thickened skin that occur on the top, sides, or tip of a toe.  Calluses are areas of thickened skin that can occur anywhere on the body, including the hands, fingers, palms, and soles of the feet. Calluses are usually larger than corns.  Corns and calluses are caused by   rubbing (friction) or pressure, such as from shoes that are too tight or do not fit properly.  Treatment may include wearing any padding, protective layers, gloves, or orthotics as told by your health care provider. This information is not intended to replace advice given to you by your health care provider. Make sure you discuss any questions you have with your health care provider. Document Revised: 12/26/2018 Document Reviewed: 07/19/2017 Elsevier Patient Education  2020 [employer]  Onychomycosis/Fungal  Toenails  WHAT IS IT? An infection that lies within the keratin of your nail plate that is caused by a fungus.  WHY ME? Fungal infections affect all ages, sexes, races, and creeds.  There may be many factors that predispose you to a fungal infection such as age, coexisting medical conditions such as diabetes, or an autoimmune disease; stress, medications, fatigue, genetics, etc.  Bottom line: fungus thrives in a warm, moist environment and your shoes offer such a location.  IS IT CONTAGIOUS? Theoretically, yes.  You do not want to share shoes, nail clippers or files with someone who has fungal toenails.  Walking around barefoot in the same room or sleeping in the same bed is unlikely to transfer the organism.  It is important to realize, however, that fungus can spread easily from one nail to the next on the same foot.  HOW DO WE TREAT THIS?  There are several ways to treat this condition.  Treatment may depend on many factors such as age, medications, pregnancy, liver and kidney conditions, etc.  It is best to ask your doctor which options are available to you.  4. No treatment.   Unlike many other medical concerns, you can live with this condition.  However for many people this can be a painful condition and may lead to ingrown toenails or a bacterial infection.  It is recommended that you keep the nails cut short to help reduce the amount of fungal nail. 5. Topical treatment.  These range from herbal remedies to prescription strength nail lacquers.  About 40-50% effective, topicals require twice daily application for approximately 9 to 12 months or until an entirely new nail has grown out.  The most effective topicals are medical grade medications available through physicians offices. 6. Oral antifungal medications.  With an 80-90% cure rate, the most common oral medication requires 3 to 4 months of therapy and stays in your system for a year as the new nail grows out.  Oral antifungal medications do  require blood work to make sure it is a safe drug for you.  A liver function panel will be performed prior to starting the medication and after the first month of treatment.  It is important to have the blood work performed to avoid any harmful side effects.  In general, this medication safe but blood work is required. 7. Laser Therapy.  This treatment is performed by applying a specialized laser to the affected nail plate.  This therapy is noninvasive, fast, and non-painful.  It is not covered by insurance and is therefore, out of pocket.  The results have been very good with a 80-95% cure rate.  [facility] is the only practice in the area to offer this therapy. 8. Permanent Nail Avulsion.  Removing the entire nail so that a new nail will not grow back. 

## 2020-02-18 NOTE — Progress Notes (Addendum)
Subjective: Doris Pacheco is a 79 y.o. female patient seen today painful corn(s) right 3rd digit and callus(es) b/l and painful mycotic nails b/l.  Pain interferes with ambulation. Aggravating factors include wearing enclosed shoe gear.   She is requesting devices to prevent her great toes from rubbing her 2nd toes. Also requesting cream for dry skin as OTC products don't help.  Patient Active Problem List   Diagnosis Date Noted  . Hypothyroidism 03/02/2016  . Hyperlipidemia 03/02/2016  . Closed fracture of right distal femur ([place]) 02/03/2016  . Abrasion, knee   . Fracture of femur, distal, right, closed ([place]) 02/02/2016  . Left ventricular diastolic dysfunction 40/98/1191  . Anemia 02/02/2016  . Femoral distal fracture, right, closed, initial encounter 02/02/2016  . Essential hypertension   . Fall   . Osteoporosis 02/18/2015  . Vertebral fracture, osteoporotic ([place]) 02/18/2015  . Bilateral low back pain without sciatica 02/18/2015  . Midline low back pain without sciatica 01/08/2015  . Chest pain 07/30/2013  . Hypertension   . Anxiety   . History of seasonal allergies   . History of CVA (cerebrovascular accident)   . GERD (gastroesophageal reflux disease)   . Arthritis   . 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 atorvastatin (LIPITOR) 20 MG tablet Take 20 mg by mouth daily.    . calcium carbonate (OSCAL) 1500 (600 Ca) MG TABS tablet Take 1,500 mg by mouth daily with breakfast.    . cetirizine (ZYRTEC) 10 MG tablet     . estrogens, conjugated, (PREMARIN) 1.25 MG tablet Take 1.25 mg by mouth daily.    . fluconazole (DIFLUCAN) 150 MG tablet Take 150 mg by mouth once.    . fluticasone (FLONASE) 50 MCG/ACT nasal spray     . hydrALAZINE (APRESOLINE) 10 MG tablet Take 10 mg by mouth 3 (three) times daily.    Marland Kitchen HYDROcodone-acetaminophen (NORCO) 10-325 MG tablet TAKE 1 TABLET BY MOUTH  4 TIMES DAILY AS NEEDED    . ibuprofen (ADVIL,MOTRIN) 800 MG tablet     . Meth-Hyo-M Bl-Na Phos-Ph Sal (URIBEL) 118 MG CAPS Take 118 mg by mouth daily.     . Methen-Hyosc-Meth Blue-Na Phos (UROGESIC-BLUE) 81.6 MG TABS     . methocarbamol (ROBAXIN) 500 MG tablet     . methocarbamol (ROBAXIN) 750 MG tablet Take 750 mg by mouth 2 (two) times daily.    Marland Kitchen MICARDIS 80 MG tablet     . montelukast (SINGULAIR) 10 MG tablet Take 10 mg by mouth at bedtime.    Marland Kitchen omeprazole (PRILOSEC) 40 MG capsule Take 40 mg by mouth daily.    . potassium chloride (K-DUR) 10 MEQ tablet     . potassium chloride (K-DUR,KLOR-CON) 10 MEQ tablet Take 10 mEq by mouth 2 (two) times daily.    . prednisoLONE acetate (PRED FORTE) 1 % ophthalmic suspension INSTILL 1 DROP INTO EACH EYES 4 TIMES DAILY FOR 1 WEEK THEN 1 DROP INTO EACH EYES TWICE DAILY FOR 1 WEEK    . predniSONE (DELTASONE) 50 MG tablet Take 1 tablet (50 mg total) by mouth daily. 5 tablet 0  . tiZANidine (ZANAFLEX) 4 MG tablet Take 4 mg by mouth 2 (two) times daily.    . traMADol (ULTRAM) 50 MG tablet Take 50 mg by mouth every 6 (six) hours as needed. for pain  2  . traMADol HCl 100 MG TABS Take 1 tablet  by mouth 2 (two) times daily.    . Triamterene-HCTZ (MAXZIDE PO) Take 1 tablet by mouth daily.    . valsartan (DIOVAN) 320 MG tablet Take 320 mg by mouth daily.    . zoledronic acid (RECLAST) 5 MG/100ML SOLN injection Inject 5 mg into the vein once.     No current facility-administered medications on file prior to visit.    Allergies  Allergen Reactions  . Forteo [Parathyroid Hormone (Recomb)] Shortness Of Breath  . Tape Rash    Paper tape OK    Objective: Physical Exam  General: Doris Pacheco is a pleasant 79 y.o. [place] female, WD, WN in NAD. AAO x 3.   Vascular:  Capillary refill time to digits immediate b/l. Palpable DP pulses b/l. Faintly palpable PT pulses b/l. Pedal hair absent b/l Skin temperature gradient within normal limits b/l. No pain  with calf compression b/l. Trace edema noted b/l feet.  Dermatological:  Pedal skin with normal turgor, texture and tone bilaterally. No open wounds bilaterally. No interdigital macerations bilaterally. Toenails 1-5 b/l elongated, discolored, dystrophic, thickened, crumbly with subungual debris and tenderness to dorsal palpation. Hyperkeratotic lesion(s) b/l lower extremities, L 2nd toe, R 3rd toe, submet head 1 left foot, submet head 1 right foot and submet head 5 right foot.  No erythema, no edema, no drainage, no flocculence. Pedal skin noted to be dry and flaky b/l LE.  Musculoskeletal:  Normal muscle strength 5/5 to all lower extremity muscle groups bilaterally. No pain crepitus or joint limitation noted with ROM b/l. Hammertoes noted to the 1-5 bilaterally.  Neurological:  Protective sensation intact 5/5 intact bilaterally with 10g monofilament b/l. Vibratory sensation intact b/l.  Assessment and Plan:  1. Pain due to onychomycosis of toenail   2. Corns and callosities   3. Xerosis cutis   4. Pain in both feet    -Examined patient. -Toenails 1-5 b/l were debrided in length and girth with sterile nail nippers and dremel without iatrogenic bleeding.  -Corn(s) R 3rd toe, L 2nd toe and callus(es) submet head 1 left foot, submet head 1 right foot  were pared utilizing sterile scalpel blade without incident. Total number debrided =4. -Patient to continue soft, supportive shoe gear daily. -Patient to report any pedal injuries to medical professional immediately. -Rx for ammonium lactate 12% to be applied to both feet prn for dry skin. -Patient/POA to call should there be question/concern in the interim.  Return in about 3 months (around 05/20/2020) for nail and callus trim.  Freddie Breech, DPM

## 2020-05-20 ENCOUNTER — Ambulatory Visit: Payer: Medicare HMO | Admitting: Podiatry

## 2020-06-01 ENCOUNTER — Other Ambulatory Visit: Payer: Self-pay

## 2020-06-01 ENCOUNTER — Ambulatory Visit (INDEPENDENT_AMBULATORY_CARE_PROVIDER_SITE_OTHER): Payer: Medicare (Managed Care) | Admitting: Podiatry

## 2020-06-01 ENCOUNTER — Encounter: Payer: Self-pay | Admitting: Podiatry

## 2020-06-01 DIAGNOSIS — L84 Corns and callosities: Secondary | ICD-10-CM

## 2020-06-01 DIAGNOSIS — B351 Tinea unguium: Secondary | ICD-10-CM

## 2020-06-01 DIAGNOSIS — M79672 Pain in left foot: Secondary | ICD-10-CM | POA: Diagnosis not present

## 2020-06-01 DIAGNOSIS — M79676 Pain in unspecified toe(s): Secondary | ICD-10-CM | POA: Diagnosis not present

## 2020-06-01 DIAGNOSIS — M79671 Pain in right foot: Secondary | ICD-10-CM

## 2020-06-01 NOTE — Progress Notes (Signed)
Subjective: Doris Pacheco is a 79 y.o. female patient seen today painful corn(s) right 3rd digit and callus(es) b/l and painful mycotic nails b/l.  Pain interferes with ambulation. Aggravating factors include wearing enclosed shoe gear.   She voices no new pedal concerns on today's visit.  Patient Active Problem List   Diagnosis Date Noted  . Hypothyroidism 03/02/2016  . Hyperlipidemia 03/02/2016  . Closed fracture of right distal femur ([facility]) 02/03/2016  . Abrasion, knee   . Fracture of femur, distal, right, closed ([facility]) 02/02/2016  . 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 Anemia 02/02/2016  . Femoral distal fracture, right, closed, initial encounter 02/02/2016  . Essential hypertension   . Fall   . Osteoporosis 02/18/2015  . Vertebral fracture, osteoporotic ([facility]) 02/18/2015  . Bilateral low back pain without sciatica 02/18/2015  . Midline low back pain without sciatica 01/08/2015  . Chest pain 07/30/2013  . Hypertension   . Anxiety   . History of seasonal allergies   . History of CVA (cerebrovascular accident)   . GERD (gastroesophageal reflux disease)   . Arthritis   . 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 ammonium lactate (LAC-HYDRIN) 12 % lotion Apply 1 application topically as needed for dry skin. 400 g 2  . atorvastatin (LIPITOR) 20 MG tablet Take 20 mg by mouth daily.    . calcium carbonate (OSCAL) 1500 (600 Ca) MG TABS tablet Take 1,500 mg by mouth daily with breakfast.    . cetirizine (ZYRTEC) 10 MG tablet     . diclofenac Sodium (VOLTAREN) 1 % GEL Apply 300 g topically as needed.    Marland Kitchen estrogens, conjugated, (PREMARIN) 1.25 MG tablet Take 1.25 mg by mouth daily.    . fluconazole (DIFLUCAN) 150 MG tablet Take 150 mg by mouth once.    . fluticasone (FLONASE) 50 MCG/ACT nasal spray     . hydrALAZINE (APRESOLINE) 10 MG tablet Take 10 mg by mouth 3 (three)  times daily.    Marland Kitchen HYDROcodone-acetaminophen (NORCO) 10-325 MG tablet TAKE 1 TABLET BY MOUTH 4 TIMES DAILY AS NEEDED    . ibuprofen (ADVIL,MOTRIN) 800 MG tablet     . Meth-Hyo-M Bl-Na Phos-Ph Sal (URIBEL) 118 MG CAPS Take 118 mg by mouth daily.     . Methen-Hyosc-Meth Blue-Na Phos (UROGESIC-BLUE) 81.6 MG TABS     . methocarbamol (ROBAXIN) 500 MG tablet     . methocarbamol (ROBAXIN) 750 MG tablet Take 750 mg by mouth 2 (two) times daily.    Marland Kitchen MICARDIS 80 MG tablet     . montelukast (SINGULAIR) 10 MG tablet Take 10 mg by mouth at bedtime.    Marland Kitchen omeprazole (PRILOSEC) 20 MG capsule Take 20 mg by mouth 2 (two) times daily.    Marland Kitchen omeprazole (PRILOSEC) 40 MG capsule Take 40 mg by mouth daily.    . potassium chloride (K-DUR) 10 MEQ tablet     . potassium chloride (K-DUR,KLOR-CON) 10 MEQ tablet Take 10 mEq by mouth 2 (two) times daily.    . prednisoLONE acetate (PRED FORTE) 1 % ophthalmic suspension INSTILL 1 DROP INTO EACH EYES 4 TIMES DAILY FOR 1 WEEK THEN 1 DROP INTO EACH EYES TWICE DAILY FOR 1 WEEK    . predniSONE (DELTASONE) 50 MG tablet Take 1 tablet (50 mg total) by mouth daily. 5 tablet 0  . PREMARIN 0.625 MG tablet Take 0.625 mg by mouth daily.    Marland Kitchen  tiZANidine (ZANAFLEX) 4 MG tablet Take 4 mg by mouth 2 (two) times daily.    . traMADol (ULTRAM) 50 MG tablet Take 50 mg by mouth every 6 (six) hours as needed. for pain  2  . traMADol HCl 100 MG TABS Take 1 tablet by mouth 2 (two) times daily.    . Triamterene-HCTZ (MAXZIDE PO) Take 1 tablet by mouth daily.    Marland Kitchen triamterene-hydrochlorothiazide (MAXZIDE-25) 37.5-25 MG tablet Take 1 tablet by mouth daily.    . valsartan (DIOVAN) 320 MG tablet Take 320 mg by mouth daily.    . zoledronic acid (RECLAST) 5 MG/100ML SOLN injection Inject 5 mg into the vein once.     No current facility-administered medications on file prior to visit.    Allergies  Allergen Reactions  . Forteo [Parathyroid Hormone (Recomb)] Shortness Of Breath  . Tape Rash    Paper  tape OK    Objective: Physical Exam  General: Doris Pacheco is a pleasant 79 y.o. [place] female, WD, WN in NAD. AAO x 3.   Vascular:  Capillary refill time to digits immediate b/l. Palpable DP pulses b/l. Faintly palpable PT pulses b/l. Pedal hair absent b/l Skin temperature gradient within normal limits b/l. No pain with calf compression b/l. Trace edema noted b/l feet.  Dermatological:  Pedal skin with normal turgor, texture and tone bilaterally. No open wounds bilaterally. No interdigital macerations bilaterally. Toenails 1-5 b/l elongated, discolored, dystrophic, thickened, crumbly with subungual debris and tenderness to dorsal palpation. Hyperkeratotic lesion(s) b/l lower extremities, L 2nd toe, R 3rd toe, submet head 1 left foot, submet head 1 right foot and submet head 5 right foot.  No erythema, no edema, no drainage, no flocculence. Pedal skin noted to be dry and flaky b/l LE.  Musculoskeletal:  Normal muscle strength 5/5 to all lower extremity muscle groups bilaterally. No pain crepitus or joint limitation noted with ROM b/l. Hammertoes noted to the 1-5 bilaterally.  Neurological:  Protective sensation intact 5/5 intact bilaterally with 10g monofilament b/l. Vibratory sensation intact b/l.  Assessment and Plan:  1. Pain due to onychomycosis of toenail   2. Corns and callosities   3. Pain in both feet    -Examined patient. -Toenails 1-5 b/l were debrided in length and girth with sterile nail nippers and dremel without iatrogenic bleeding.  -Corn(s) R 3rd toe, L 2nd toe and callus(es) submet head 1 left foot, submet head 1 right foot  were pared utilizing sterile scalpel blade without incident. Total number debrided =4. -Dispensed silipos toe pads for b/l great toes to keep great toes separated from 2nd digits. Apply every morning, remove every evening. -Patient to continue soft, supportive shoe gear daily. -Patient to report any pedal injuries to medical professional  immediately. -Continue ammonium lactate 12% to be applied to both feet prn for dry skin. -Patient/POA to call should there be question/concern in the interim.  Return in about 3 months (around 08/31/2020).  Freddie Breech, DPM

## 2020-06-15 ENCOUNTER — Other Ambulatory Visit: Payer: Self-pay

## 2020-06-15 NOTE — Patient Outreach (Signed)
Aging Gracefully Program  06/15/2020  SHERALEE QAZI 1941/03/28 [id number]   [facility] Evaluation Interviewer made contact with patient. Aging Gracefully survey completed.   Interviewer will send referral to OT for follow up.   [facility] Management Assistant

## 2020-06-29 ENCOUNTER — Other Ambulatory Visit: Payer: Self-pay | Admitting: Occupational Therapy

## 2020-06-30 ENCOUNTER — Other Ambulatory Visit: Payer: Self-pay

## 2020-06-30 NOTE — Patient Outreach (Signed)
Aging Gracefully Program  OT Initial Visit  06/30/2020  Doris Pacheco 04-Aug-1941 [id number]  Visit:  1- Initial Visit  Start Time:  1630 End Time:  1730 Total Minutes:  60  CCAP: Typical Daily Routine: Typical Daily Routine:: She gets up around 9-10 AM, sometimes washes up first thing or does this later, eats breakfast, spends the day watching TV, doing word puzzles, reads, has a tablet, does a little around the house, gets meals on wheels for lunch. What Types Of Care Problems Are You Having Throughout The Day?: Sometimes its hard to turn up covers on my bed, getting off of the toilet, getting down to the freezer in the utility room, getting things off the floor  What Kind Of Help Do You Receive?: Aide 4 days a week 2 hours per day. Children get her groceries and do other shoipping for her, gets meals on wheels What Do You Think Would Make Everyday Life Easier For You?: An extra step and rail down to my utility area, a rail at my back ramp, new DME for bathroom What Is A Good Day Like?: When I have a visitor or can go see family in [place] What Is A Bad Day Like?: When I am here all alone Do You Have Time For Yourself?: yes Patient Reported Equipment: Patient Reported Equipment Currently Used: Bedside Commode, Grab Bars, Reacher, Rollator, Paediatric nurse, Single [employer] Functional Mobility-Walking Indoors/Getting Around the [employer]:   Functional Mobility-Walk A Block: Walk A Block: A Little Difficulty Other Comments:: Feels like she could if she had her rollator and it was on level ground Functional Mobility-Maintain Balance While Showering: Maintaining Balance While Showering: No Difficulty Other Comments:: has a grab bar and a shower seat Functional Mobility-Stooping, Crouching, Kneeling To Retreive Item: Stooping, Crouching, or Kneeling To Retrieve Item: A Little Difficulty Other Comments:: Can do it slowly--crouches. Also has a Nurse, adult Mobility-Bending From  Standing Position To Pick Up Clothing Off The Floor: Bending Over From Standing Position To Pick Up Clothing Off The Floor: Unable To Do Other Comments:: crouches, uses her reacher, or has aide to do it when she comes Functional Mobility-Reaching For Items Above Shoulder Level: Reaching For Items Above Shoulder Level: A Little Difficulty Other Comments:: with RUE (bad shoulder--no surgery per her MD) Functional Mobility-Climb 1 Flight Of Stairs: Climb 1 Flight Of Stairs: A Little Difficulty Other Comments:: Need to take my time and have a rail Functional Mobility-Move In And Out Of Chair: Move In and Out Of A Chair: No Difficulty Functional Mobility-Move In And Out Of Bed: Move In and Out Of Bed: No Difficulty Other Comments:: has a step platfomr Functional Mobility-Move In And Out Of Bath/Shower: Move In And Out Of A Bath/Shower: A Little Difficulty Other Comments:: Has a bar outside tub, and one on far inner wall, needs one at front wall. Functional Mobility-Get On And Off Toilet: Getting Up From The Floor: Unable To Do Other Comments:: has a life alert. Cannot get off the floor by herself due to her multiple lower extremity surgeries Functional Mobility-Into And Out Of Car, Not Including Driving: Into  And Out Of Car, Not Including Driving: No Difficulty Functional Mobility-Other Mobility Difficulty:      Activities of Daily Living-Bathing/Showering: ADL-Bathing/Showering: No Difficulty Other Comments:: Crossses one leg over the other to get to her feet Activities of Daily Living-Personal Hygiene and Grooming: Personal Hygiene and Grooming: No Difficulty Activities of Daily Living-Toilet Hygiene: Toilet Hygiene: No Difficulty Activities of Daily Living-Put  On And Take Off Undergarments (Incl. Fasteners): Put On And Take Off Undergarments (Incl. Fasteners): No Difficulty Activities of Daily Living-Put On And Take Off Shirt/Dress/Coat (Incl. Fasteners): Put On And Take Off  Shirt/Dress/Coat (Incl. Fasteners): No Difficulty Activities of Daily Living-Put On And Take Off Socks And Shoes: Put On And Take Off Socks And  Shoes: No Difficulty Other Comments:: Crosses one leg over the other Activities of Daily Living-Feed Self: Feed Self: No Difficulty Activities of Daily Living-Rest And Sleep: Rest and Sleep: No Difficulty Activities of Daily Living-Sexual Activity: Sexual  Activity: N/A Activities of Daily Living-Other Activity Identified:    Instrumental Activities of Daily Living-Light Homemaking (Laundry, Straightening Up, Vacuuming):  Do Light Homemaking (Laundry, Straightening Up, Vacuuming): A Lot of Difficulty Other Comments:: Has a aide Instrumental Activities of Daily Living-Making A Bed: Making a Bed: Unable To Do Other Comments:: Has as aide Instrumental Activities of Daily Living-Washing Dishes By Hand While Standing At The Sink: Washing Dishes By Hand While Standing At The Sink: No Difficulty Instrumental Activities of Daily Living-Grocery Shopping: Do Grocery Shopping: N/A Other Comments:: family does this for her Instrumental Activities of Daily Living-Use Telephone: Use Telephone: No Difficulty Instrumental Activities of Daily Living-Financial Management: Financial Management: No Difficulty Instrumental Activities of Daily Living-Medications: Take Medications: No Difficulty Instrumental Activities of Daily Living-Health Management And Maintenance: Health Management & Maintenance: A Little Difficulty Other Comments:: Not getting out and about as much as did before COVID. Does go to all of her annual medical visits Instrumental Activities of Daily Living-Meal Preparation and Clean-Up: Meal Preparation and Clean-Up: A Little Difficulty Other Comments:: Doesn't do much cooking, mainly heats up food. Gets meals on wheels Instrumental Activities of Daily Living-Provide Care For Others/Pets: Care For Others/Pets: N/A Instrumental Activities of  Daily Living-Take Part In Organized Social Activities: Take Part In Organized Social Activities: N/A Other Comments:: Did some before COVID Instrumental Activities of Daily Living-Leisure Participation: Leisure Participation: N/A Other Comments:: Did before COVID Instrumental Activities of Daily Living-Employment/Volunteer Activities: Employment/Volunteer Activities: N/A Instrumental Activities of Daily Living-Other Identifies:    Readiness To Change Score:  Readiness to Change Score: 10  Home Environment Assessment: Outside Home Entry:: She does not use front door due to steps and unevern sidewalk. She uses back door which has a wooden ramp but no rail and there is a higher than normal threshold step into the house Entryway/Foyer:: The entrance she uses there is a ramped area inside the house that is really too steep. She does have a rail on one side there. Stairs:: There are steps that lead down into her utility area/laundry area. These steps are uneven in height and there is not a hand rail there Bathroom:: She has a walk in shower in her primary bathroom with a grab bar right out side the shower and one inside the shower built into the shower wall. She needs a bar at the front of her shower and a hand held shower. Her other bath is a 1/2 bath (small) Smoke/CO2 Detector:: She has one Scientist, forensic:: Does not have Mailbox:: At her front door on house Other Home Environment Concerns:: Carpet is bunching up in places--needs to be stretched back out   Goals: Goals Addressed            This Visit's Progress   . Patient Stated       She would like to feel more safe in bathroom (3n1 in both bathrooms, a new shower seat, a hand held shower head and  grab bar in shower would help this)    . Patient Stated       She would like to feel more safe in the house (stretching the carpet back out in places will help with this)    . Patient Stated       She would like to feel more safe  getting down to her utility area behind kitchen. (an extra step at bottom of ramp and a handrail, as well as seeing if there is a way to not make the ramp so steep would help)    . Patient Stated       She would like to feel more safe getting into and out of the house at her back door. (a threshold ramp inside and handrail outside would help with this)       Post Clinical Reasoning: Clinician View Of Client Situation:: Ms. Harned does relatively well for herself given her multiple joint surgeries. She does all of her own basic ADLs and some of her IADLs. She has an aide that comes in 4 days a week for 2 hours each day to help her out with her IADLs and errands. Her son and daughter do her shopping for her. She has a rollator, a single point cane, a rolling walker, an old shower seat, and an old 3n1. She gets meals on wheels and mainly heats up food with some cooking. Client View Of His/Her Situation:: Ms. Tusing states that she knows she has to keep moving and trying to do things for herself to keep herself healthy. She is interested in getting HHPT to work on balance and strengthening. She wishes that she could have more visitors, but is cautious due to COVID

## 2020-06-30 NOTE — Patient Instructions (Signed)
Goals Addressed            This Visit's Progress   . Patient Stated       She would like to feel more safe in bathroom (3n1 in both bathrooms, a new shower seat, a hand held shower head and grab bar in shower would help this)    . Patient Stated       She would like to feel more safe in the house (stretching the carpet back out in places will help with this)    . Patient Stated       She would like to feel more safe getting down to her utility area behind kitchen. (an extra step at bottom of ramp and a handrail, as well as seeing if there is a way to not make the ramp so steep would help)    . Patient Stated       She would like to feel more safe getting into and out of the house at her back door. (a threshold ramp inside and handrail outside would help with this)

## 2020-07-17 ENCOUNTER — Other Ambulatory Visit: Payer: Self-pay | Admitting: *Deleted

## 2020-07-17 NOTE — Patient Outreach (Signed)
Aging Gracefully Program  07/17/2020  Doris Pacheco 1941/06/20 [id number]  Placed call to client Shereta Crothers to schedule initial Aging Gracefully RN home visit; HIPAA/ identity verified.     Visit scheduled with client for Thursday, July 30, 2020 at 2:00 pm; verified patient's address and explained that I would contact her by phone on day of scheduled visit prior to arriving at her home, to complete coronavirus questionnaire screening tool; confirmed that patient has a mask available at (her) home for use during scheduled visit.   Provided my direct contact information to patient, should she wish to contact me prior to scheduled home visit.   Plan:   Scheduled RN initial home visit on Thursday, July 30, 2020 at 2:00 pm  Caryl Pina, [place], BSN, [employer] Coordinator [facility] Care Management  [phone]

## 2020-07-30 ENCOUNTER — Other Ambulatory Visit: Payer: Self-pay

## 2020-07-30 ENCOUNTER — Encounter: Payer: Self-pay | Admitting: *Deleted

## 2020-07-30 ENCOUNTER — Other Ambulatory Visit: Payer: Self-pay | Admitting: *Deleted

## 2020-07-30 NOTE — Patient Instructions (Addendum)
Doris Pacheco, it was great meeting you today!  Please review this information from our session today and be thinking of other ways you can prevent falls, so we can talk about them with our next visit together.  I have scheduled our next visit together as a phone call on Wednesday August 26, 2020, between 1:00 pm - 2:00 pm- please be listening out for my phone call -- I look forward to talking to you then.  I hope you and your family have a wonderful Thanksgiving!     CLIENT/RN ACTION PLAN - FALL PREVENTION  Registered Nurse:  Reginia Naas RN Date: July 30, 2020  [name] Client ID:  [id number]   Target Area:  FALL PREVENTION    Why Problem May Occur: -- previous falls in the past, some of which have caused serious injuries -- occasional knee pain, body aches -- arthritis and joint issues   Target Goal: Prevent future falls   STRATEGIES Coping Strategies Ideas  Change Positions Slowly: lying to sitting, sitting to standing . Changing positions can make people lightheaded. . When getting up in the morning sit for a few minutes, before standing.  . Stand for a few minutes before walking and hold on to sturdy furniture or countertop.  Doris Pacheco job at taking your time and not rushing- keep up the great work! . Continue to use good posture when standing and when sitting  Drink water- don't become dehydrated . Dehydration can make people dizzy. . Ask your healthcare provider how much water you can drink. . Decrease caffeine, caffeine makes you urinate a lot, which can make you dehydrated. .   If you fall, tell someone . Tell a friend or family member even if you didn't get hurt. . Tell your healthcare provider if you fall.  They can help you figure out why. Doris Pacheco job at always wearing your Life-Alert system-- keep up the good work!  Get your vision and hearing checked-- great job at staying on top of these important annual exams! . Poor vision and hearing loss can make  people fall. . Glaucoma and diabetes can cause poor vision. .   Other .    Prevention Ideas  Review your Medicines . Many medicines can make you dizzy or sleepy and increase your risk of falling. . Your Aging Gracefully Nurse will look at your medications and see if you are taking any that might cause falling. . Always review your medications with your doctor each time you visit with him; Always take an updated list of your medications to doctor's appointments . Your blood pressure medications could lead to dizziness- if you start experiencing dizziness, tell your doctor right away  Activity and Exercise . Aging Gracefully exercises- try to start incorporating some of the exercises we discussed into your regular routine- start slowly and gradually build up with each week.  Don't over-do with any activity or exercise program . Walking (inside or outside) . Dancing . Gardening . Housework:  cooking, Education administrator, Medical sales representative . Exercise while watching TV . Swimming or water aerobics. . Ask your doctor if Physical therapy would be a good idea for you  Strengthen Bones . Exercise makes your bones stronger. . Vitamin D and Calcium make your bones stronger.  Ask your Healthcare provider if you should be taking them. . Your body makes vitamin D from the sun.  Sit in the sun for 10 minutes every day (without sunscreen). .   Control your urine N/A .  Prevent constipation . Cut back on caffeinated drinks. . Don't wait to urinate. . If diabetic, control your blood sugar. . Ask your Aging Gracefully Nurse and Healthcare Provider  about urine control. .   Control your blood sugar (if you are diabetic) N/A . High blood sugar can cause frequent urination, poor vision, and numbness in your feet, which can make you fall.   . Low blood sugar can cause confusion and dizziness. .   Other coping strategies 1. Keep your pathways in your home free of clutter and clear for walking 2.  Always wear good sturdy shoes  with good soles- especially if you are going outside of your home 3. Always pay attention to weather conditions and develop a plan for bad weather, especially if surfaces may be wet or slippery 4. Keep up the great work at using your cane and walker    PRACTICE It is important to practice the strategies so we can determine if they will be effective in helping to reach the goal.    Follow these specific recommendations:  Continue thinking about things you can do in your personal situation that would decrease your risk of falling, so we can talk about these during future visits together.  Read over the attached information and if you have any questions or ideas, write them down so you don't forget, so we can discuss when we visit next time.      If strategy does not work the first time, try it again.     We may make some changes over the next few sessions.       Doris Rack, RN, BSN, [facility]  [phone]    It is important to avoid accidents which may result in broken bones.  Here are a few ideas on how to make your home safer so you will be less likely to trip or fall.  1. Use nonskid mats or non slip strips in your shower or tub, on your bathroom floor and around sinks.  If you know that you have spilled water, wipe it up! 2. In the bathroom, it is important to have properly installed grab bars on the walls or on the edge of the tub.  Towel racks are NOT strong enough for you to hold onto or to pull on for support. 3. Stairs and hallways should have enough light.  Add lamps or night lights if you need ore light. 4. It is good to have handrails on both sides of the stairs if possible.  Always fix broken handrails right away. 5. It is important to see the edges of steps.  Paint the edges of outdoor steps white so you can see them better.  Put colored tape on the edge of inside steps. 6. Throw-rugs are dangerous because they  can slide.  Removing the rugs is the best idea, but if they must stay, add adhesive carpet tape to prevent slipping. 7. Do not keep things on stairs or in the halls.  Remove small furniture that blocks the halls as it may cause you to trip.  Keep telephone and electrical cords out of the way where you walk. 8. Always were sturdy, rubber-soled shoes for good support.  Never wear just socks, especially on the stairs.  Socks may cause you to slip or fall.  Do not wear full-length housecoats as you can easily trip on the bottom.  9. Place the things you use the most on  the shelves that are the easiest to reach.  If you use a stepstool, make sure it is in good condition.  If you feel unsteady, DO NOT climb, ask for help. 10. If a health professional advises you to use a cane or walker, do not be ashamed.  These items can keep you from falling and breaking your bones.   Preventing Falls and Fractures  Falls can be very serious, especially for older adults or people with osteoporosis  Falls can be caused by:  Tripping or slipping  Slow reflexes  Balance problems  Reduced muscle strength  Poor vision or a recent change in prescription  Illness and some medications (especially blood pressure pills, diuretics, heart medicines, muscle relaxants and sleep medications)  Drinking alcohol  To prevent falls outdoors:  Use a can or walker if needed  Wear rubber-soled shoes so you don't slip  DO NOT buy "shape up" shoes with rocker bottom soles if you have balance problems.  The thick soles and shape make it more difficult to keep your balance.  Put kitty litter or salt on icy sidewalks  Walk on the grass if the sidewalks are slick  Avoid walking on uneven ground whenever possible  T prevent falls indoors:  Keep rooms clutter-free, especially hallways, stairs and paths to light switches  Remove throw rugs  Install night lights, especially to and in the bathroom  Turn on lights before  going downstairs  Keep a flashlight next to your bed  Buy a cordless phone to keep with you instead of jumping up to answer the phone  Install grab bars in the bathroom near the shower and toilet  Install rails on both sides of the stairs.  Make sure the stairs are well lit  Wear slippers with non-skid soles.  Do not walk around in stockings or socks  Balance problems and dizziness are not a normal part of growing older.  If you begin having balance problems or dizziness see your doctor.  Physical Therapy can help you with many balance problems, strengthening hip and leg muscles and with gait training.  To keep your bones healthy make sure you are getting enough calcium and Vitamin D each day.  Ask your doctor or pharmacist about supplements.  Regular weight-bearing exercise like walking, lifting weights or dancing can help strengthen bones and prevent osteoporosis.   Understanding Your Risk for Falls Each year, millions of people have serious injuries from falls. It is important to understand your risk for falling. Talk with your health care provider about your risk and what you can do to lower it. There are actions you can take at home to lower your risk. If you do have a serious fall, make sure you tell your health care provider. Falling once raises your risk for falling again. How can falls affect me? Serious injuries from falls are common. These include:  Broken bones, such as hip fractures.  Head injuries, such as traumatic brain injuries (TBI). Fear of falling can also cause you to avoid activities and stay at home. This can make your muscles weaker and actually raise your risk for a fall. What can increase my risk? There are a number of risk factors that increase your risk for falling. The more risk factors you have, the higher your risk for falling. Serious injuries from a fall most often happen to people older than age 17. Children and young adults ages 55-29 are also at higher  risk. Common risk factors include:  Weakness  in the lower body.  Lack (deficiency) of vitamin D.  Being generally weak or confused due to long-term (chronic) illness.  Dizziness or balance problems.  Poor vision.  Medicines that cause dizziness or drowsiness. These can include medicines for your blood pressure, heart, anxiety, insomnia, or edema, as well as pain medicines and muscle relaxants. Other risk factors include:  Drinking alcohol.  Having had a fall in the past.  Having depression.  Foot pain or improper footwear.  Working at a dangerous job.  Having any of the following in your home: ? Tripping hazards, such as floor clutter or loose rugs. ? Poor lighting. ? Pets or clutter.  Dementia or memory loss. What actions can I take to lower my risk of falling?     Physical activity Maintain physical fitness. Do strength and balance exercises. Consider taking a regular class to build strength and balance. Yoga and tai chi are good options. Vision Have your eyes checked every year and your vision prescription updated as needed. Walking aids and footwear  Wear nonskid shoes. Do not wear high heels.  Do not walk around the house in socks or slippers.  Use a cane or walker as told by your health care provider. Home safety  Attach secure railings on both sides of your stairs.  Install grab bars for your tub, shower, and toilet. Use a bath mat in your tub or shower.  Use good lighting in all rooms. Keep a flashlight near your bed.  Make sure there is a clear path from your bed to the bathroom. Use night-lights.  Do not use throw rugs. Make sure all carpeting is taped or tacked down securely.  Remove all clutter from walkways and stairways, including extension cords.  Repair uneven or broken steps.  Avoid walking on icy or slippery surfaces. Walk on the grass instead of on icy or slick sidewalks. Where you can, use ice melt to get rid of ice on  walkways.  Use a cordless phone. Questions to ask your health care provider  Can you help me check my risk for a fall?  Do any of my medicines make me more likely to fall?  Should I take a vitamin D supplement?  What exercises can I do to improve my strength and balance?  Should I make an appointment to have my vision checked?  Do I need a bone density test to check for weak bones or osteoporosis?  Would it help to use a cane or a walker? Where to find more information  Centers for Disease Control and Prevention, STEADI: http://www.wolf.info/  Community-Based Fall Prevention Programs: http://www.wolf.info/  National Institute on Aging: ToneConnect.com.ee Contact a health care provider if:  You fall at home.  You are afraid of falling at home.  You feel weak, drowsy, or dizzy. Summary  People 42 and older are at high risk for falling. However, older people are not the only ones injured in falls. Children and young adults have a higher-than-normal risk too.  Talk with your health care provider about your risks for falling and how to lower those risks.  Taking certain precautions at home can lower your risk for falling.  If you fall, always tell your health care provider. This information is not intended to replace advice given to you by your health care provider. Make sure you discuss any questions you have with your health care provider. Document Revised: 03/13/2019 Document Reviewed: 03/13/2019 Elsevier Patient Education  [place].

## 2020-07-30 NOTE — Patient Outreach (Signed)
Aging Physiological scientist Visit # 1  07/30/2020  Doris Pacheco 09-18-1941 [id number]  Visit:   Aging gacrefully RN Visit # 1- in person at patient's home; corona virus screening call completed by phone prior to home visit; no concerns identified  Start Time:   13:55 End Time:   15:20 Total Minutes:   85  Readiness To Change Score:  Readiness to Change Score: 9  Universal RN Interventions: Calendar Distribution: Yes Exercise Review: Yes- modified around patient's baseline abilities Medications: Yes Medication Changes: No Mood: Yes Pain: Yes PCP Advocacy/Support: Yes Fall Prevention: Yes Incontinence: Yes  Healthcare Provider Communication: Did Surveyor, mining With [employer] Healthcare Provider?: No  Clinician View of Client Situation: Clinician View Of Client Situation: 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  She has a life alert which she wears at all times and she understands at baseline what things she is able to do and not do.  She reports 3 falls this year without serous injury, but acknowledges that she has had several falls in the past which have resulted in fracture.  She struggles with srthritis, which she reports does not cause her chronic pain, but affects her ability to move and ambulate as well as she would like.  Chere appears very motivated in preventing future falls and remaining as independent as she can.   Client's View of His/Her Situation: Client View Of His/Her Situation: "I am doing the best I can every day- I try to do everything that I can for myself, but I do need help with household stuff, like cleaning and laundry- my aide comes 4 times per week and she helps me with stuff like that, because I just can't do that kind of stuff anymore. I have had several falls over the last few years,  and some that have caused serious injury; none of my recent falls have caused any serious injury, but I sometimes have a hard time getting up, so I use my life-alert if I need to.  I learned a long time ago that I can't rush; so I just take my time and go slow.  I don't take any chances."  Medication Assessment: Do You Have Any Problems Paying For Medications?: No Where Does Client Store Medications?: Other: (dedicated bag; uses pill box) Can Client Read Pill Bottles?: Yes Does Client Use A Pillbox?: Yes Does Anyone Assist Client In Filling Pillbox?: No Does Anyone Assist Client In Taking Medications?: No Do You Take Vitamin D?: Yes Total Number Of Medications That The Client Takes: 12 (includes Rx and OTC) Does Client Have Any Questions Or Concerns About Medictions?: No Is Client Complaining Of Any Symptoms That Could Be Side Effects To Medications?: No Any Possible Changes In Medication Regimen?: No  OT Update: Will update Aging Gracefully team around successful completion of today's initial RN Visit  Session Summary: Pleasant in-person RN visit # 1; client's home is neat and uncluttered in the areas she routinely resides in; client is independent in ADL's and most iADL's, but requires assistance for several household chores.  Home modifications have been initiated in client's home and she is already enjoying and using these modifications, which she reports have benefited her already.  Client is a high fall risk at baseline and interested in working on fall prevention  as her target area with Aging Gracefully RN goals.

## 2020-08-11 ENCOUNTER — Other Ambulatory Visit: Payer: Self-pay | Admitting: Occupational Therapy

## 2020-08-11 ENCOUNTER — Other Ambulatory Visit: Payer: Self-pay

## 2020-08-11 NOTE — Patient Instructions (Addendum)
She would like to feel more safe in the house (stretching the carpet back out in places will help with this)--COMPLETED  She would like to feel more safe getting down to her utility area behind kitchen. (an extra step at bottom of ramp and a handrail, as well as seeing if there is a way to not make the ramp so steep would help)--COMPLETED  ACTION PLANNING - FUNCTIONAL MOBILITY  Target Problem Area: Safety with getting to her utility room and carrying items back from her freezer  Why Problem May Occur: Uneven steps, no handrail, unsteady balance, has to use at least an assistive device in one hand for mobility   Target Goal: She would like to feel safer getting down to her utility area behind kitchen to get items from her freezer. (An extra step at bottom of steps, a handrail and grab bar at steps, and a shoulder bag to carry items so her hands are free would help; as well as seeing if there is a way to not make the inside ramp not so steep--[facility] put down no skid strips on inside ramp. They also put a hand rail and grab bar in the laundry room.    STRATEGIES Modifying your home environment and making it safe: DO: DON'T:  Have a handrail installed at utility room steps Hold onto unsafe surfaces   Always use handrail when going up and down steps  Don't hold onto items with both hands as you go up and down steps  Use cross body bag to carry items to/from utility room.    Simplifying the way you set up tasks or daily routines: DO: DON'T:  Move slowly Rush during transfers or walking   Practice It is important to practice the strategies so we can determine if they will be effective in helping to reach your goal. Follow these specific recommendations: 1.Use crossbody bag if at all possible to bring items up from utility room 2. Hold onto rail with one hand and use cane in other hand to go up and down utility steps  If a strategy does not work the first time, try it again and again (and maybe  again). We may make some changes over the next few sessions, based on how they work.  Ignacia Palma, OTR/L     08/11/2020

## 2020-08-11 NOTE — Patient Outreach (Signed)
Aging Gracefully Program  OT Follow-Up Visit  08/11/2020  Doris Pacheco [birth year] [id number]  Visit:  2- Second Visit  Start Time:  1200 End Time:  1245 Total Minutes:  45  Readiness to Change Score :  Readiness to Change Score: 10   Durable Medical Equipment: Adaptive Equipment: Other (crossbody bag) Adaptive Equipment Distribution Date: 08/11/20  Patient Education: Education Provided: Yes Education Details: We went over in detail "safety checklist"--pt already doing 100% of items on this handout. We did not go over the getting up from a fall due to pt has a life line to push if she does fall and she knows she could not get up if she fell due to bad knees. Person(s) Educated: Patient Comprehension: Verbalized Understanding  Goals:  Goals Addressed            This Visit's Progress   . COMPLETED: Patient Stated       She would like to feel more safe in the house (stretching the carpet back out in places will help with this)    . COMPLETED: Patient Stated       She would like to feel more safe getting down to her utility area behind kitchen. (an extra step at bottom of ramp and a handrail, as well as seeing if there is a way to not make the ramp so steep would help)  ACTION PLANNING - FUNCTIONAL MOBILITY  Target Problem Area: Safety with getting to her utility room and carrying items back from her freezer  Why Problem May Occur: Uneven steps, no handrail, unsteady balance, has to use at least an assistive device in one hand for mobility   Target Goal: She would like to feel safer getting down to her utility area behind kitchen to get items from her freezer. (An extra step at bottom of steps, a handrail and grab bar at steps, and a shoulder bag to carry items so her hands are free would help; as well as seeing if there is a way to not make the inside ramp not so steep--[facility] put down no skid strips on inside ramp. They also put a hand rail and grab bar in the laundry  room.    STRATEGIES Modifying your home environment and making it safe: DO: DON'T:  Have a handrail installed at utility room steps Hold onto unsafe surfaces   Always use handrail when going up and down steps  Don't hold onto items with both hands as you go up and down steps  Use cross body bag to carry items to/from utility room.    Simplifying the way you set up tasks or daily routines: DO: DON'T:  Move slowly Rush during transfers or walking   Practice It is important to practice the strategies so we can determine if they will be effective in helping to reach your goal. Follow these specific recommendations: 1.Use crossbody bag if at all possible to bring items up from utility room 2. Hold onto rail with one hand and use cane in other hand to go up and down utility steps  If a strategy does not work the first time, try it again and again (and maybe again). We may make some changes over the next few sessions, based on how they work.  Doris Pacheco, OTR/L     08/11/2020        Post Clinical Reasoning: Client Action (Goal) Four Interventions: Client able to get to and from her freezer in [employer] room safey with home modifications  completed and with cross shoulder bag provided she was able to get items from her freezer and take them to her kitchen. Did Client Try?: Yes Targeted Problem Area Status: A Lot Better Clinician View Of Client Situation:: Doris Pacheco moves slowly and cautiously so as to be safe. She is quite happy with the home modifications that have been completed. Client View Of His/Her Situation:: Doris Pacheco feels safer with accessing her utility area, laundry area, and exiting her back door. THere is a question about a "threshold" ramp that was on the work order for the back door that was not completed. Next Visit Plan:: 3n1. Doris Pacheco is interested in starting HHPT to help with her balance and strength. While I was at her home we called her primary care provider and asked  if he would please order HHPT.

## 2020-08-18 ENCOUNTER — Other Ambulatory Visit: Payer: Self-pay | Admitting: *Deleted

## 2020-08-18 NOTE — Patient Outreach (Signed)
Aging Gracefully Program  08/18/2020  LORRA FREEMAN 08-28-1941 [id number]  Placed call to client Doris Pacheco to facilitate transition of upcoming Aging Gracefully RN visits; HIPAA/ identity verified.    Explained to client upcoming transition of Aging Gracefully RN team; confirmed that she is agreeable to RN home visit # 2 with Aging Gracefully RN Rowe Pavy on Wednesday August 26, 2020 at 1:30 pm  Provided Amanda's direct contact information to patient, should she wish to contact her prior to upcoming scheduled home visit.   Caryl Pina, RN, BSN, [employer] [facility] Care Management  [phone]

## 2020-08-26 ENCOUNTER — Other Ambulatory Visit: Payer: Self-pay

## 2020-08-26 ENCOUNTER — Encounter: Payer: TRICARE For Life (TFL) | Admitting: *Deleted

## 2020-08-26 NOTE — Patient Outreach (Signed)
Aging Gracefully Program  RN Visit  08/26/2020  Doris Pacheco December 26, 1940 [id number]  Visit:   RN Home visit #2  Start Time:   1330 End Time:   1410 Total Minutes:    40   Readiness To Change Score:     Universal RN Interventions: Calendar Distribution: Yes Exercise Review: Yes Medications: Yes Medication Changes: Yes Mood: Yes Pain: Yes PCP Advocacy/Support: Yes Fall Prevention: Yes Clinician View Of Client Situation: Pre covid screening completed prior to arrival.  Arrived to home. Difficulty getting patient to the door. Patient slow to answer and slow to ambualte. Uses a cane.  walker noted in living area. Uses a lift chair. Patient report she is happy in her current situation. reports she enjoys cocmpany and has an aid 2 hours per day. Aid assist with house work and whatever patient needs. Patient reports she does watch TV and does crossword puzzles. Client View Of His/Her Situation: Reports she is blessed.  Reports right knee issues. total knee replacement 2010.  walks with walker and cane.  Reports sees family weekly.   Reports she takes her medications as prescribed.  Reports foot doctor appointment in 1 week.  Healthcare Provider Communication: Did Surveyor, mining With [employer] Provider?: No  Clinician View of Client Situation: Clinician View Of Client Situation: Pre covid screening completed prior to arrival.  Arrived to home. Difficulty getting patient to the door. Patient slow to answer and slow to ambualte. Uses a cane.  walker noted in living area. Uses a lift chair. Patient report she is happy in her current situation. reports she enjoys cocmpany and has an aid 2 hours per day. Aid assist with house work and whatever patient needs. Patient reports she does watch TV and does crossword puzzles. Client's View of His/Her Situation: Client View Of His/Her Situation: Reports she is blessed.  Reports right knee issues. total knee replacement 2010.  walks with walker and  cane.  Reports sees family weekly.   Reports she takes her medications as prescribed.  Reports foot doctor appointment in 1 week.  Medication Assessment:deines changes to medications    OT Update: patient reports modifications completed except insulation in attic.   Session Summary: First time meeting patient who is delightful and thankful for program.  Reports attic insulation not completed and she states she does not have anyone to clean out the attic.  Reports handrails and grab bars are very helpful.  No recent falls.Home exercise plan reviewed.   Goals    . Aging Gracefully RN: Prevent falls     CLIENT/RN ACTION PLAN - FALL PREVENTION  Registered Nurse:  Ricke Hey RN Date: July 30, 2020  [name] Client ID:  [id number]   Target Area:  FALL PREVENTION    Why Problem May Occur: -- previous falls in the past, some of which have caused serious injuries -- occasional knee pain, body aches -- arthritis and joint issues   Target Goal: Prevent future falls   STRATEGIES Coping Strategies Ideas  Change Positions Slowly: lying to sitting, sitting to standing . Changing positions can make people lightheaded. . When getting up in the morning sit for a few minutes, before standing.  . Stand for a few minutes before walking and hold on to sturdy furniture or countertop.  Randie [employer] job at taking your time and not rushing- keep up the great work! . Continue to use good posture when standing and when sitting  Drink water- don't become dehydrated . Dehydration  can make people dizzy. . Ask your healthcare provider how much water you can drink. . Decrease caffeine, caffeine makes you urinate a lot, which can make you dehydrated. .   If you fall, tell someone . Tell a friend or family member even if you didn't get hurt. . Tell your healthcare provider if you fall.  They can help you figure out why. Randie [employer] job at always wearing your Life-Alert system-- keep up the good work!   Get your vision and hearing checked-- great job at staying on top of these important annual exams! . Poor vision and hearing loss can make people fall. . Glaucoma and diabetes can cause poor vision. .   Other .    Prevention Ideas  Review your Medicines . Many medicines can make you dizzy or sleepy and increase your risk of falling. . Your Aging Gracefully Nurse will look at your medications and see if you are taking any that might cause falling. . Always review your medications with your doctor each time you visit with him; Always take an updated list of your medications to doctor's appointments . Your blood pressure medications could lead to dizziness- if you start experiencing dizziness, tell your doctor right away  Activity and Exercise . Aging Gracefully exercises- try to start incorporating some of the exercises we discussed into your regular routine- start slowly and gradually build up with each week.  Don't over-do with any activity or exercise program . Walking (inside or outside) . Dancing . Gardening . Housework:  cooking, Education officer, environmental, Pharmacologist . Exercise while watching TV . Swimming or water aerobics. . Ask your doctor if Physical therapy would be a good idea for you  Strengthen Bones . Exercise makes your bones stronger. . Vitamin D and Calcium make your bones stronger.  Ask your Healthcare provider if you should be taking them. . Your body makes vitamin D from the sun.  Sit in the sun for 10 minutes every day (without sunscreen). .   Control your urine N/A . Prevent constipation . Cut back on caffeinated drinks. . Don't wait to urinate. . If diabetic, control your blood sugar. . Ask your Aging Gracefully Nurse and Healthcare Provider  about urine control. .   Control your blood sugar (if you are diabetic) N/A . High blood sugar can cause frequent urination, poor vision, and numbness in your feet, which can make you fall.   . Low blood sugar can cause confusion and  dizziness. .   Other coping strategies 1. Keep your pathways in your home free of clutter and clear for walking 2.  Always wear good sturdy shoes with good soles- especially if you are going outside of your home 3. Always pay attention to weather conditions and develop a plan for bad weather, especially if surfaces may be wet or slippery 4. Keep up the great work at using your cane and walker    PRACTICE It is important to practice the strategies so we can determine if they will be effective in helping to reach the goal.    Follow these specific recommendations:  Continue thinking about things you can do in your personal situation that would decrease your risk of falling, so we can talk about these during future visits together.  Read over the attached information and if you have any questions or ideas, write them down so you don't forget, so we can discuss when we visit next time.      If strategy does not work  the first time, try it again.     We may make some changes over the next few sessions.       Caryl Pina, RN, BSN, [employer] Coordinator [facility] Care Management  [phone]    08/26/2020   No falls, reports slow to move about.  Interventions:  Provided written aging gracefully exercises. Reviewed and demonstrated exercises.  Patient voiced understanding.  Reviewed importance of doing exercises daily. Provided calender and my contact information.  Rowe Pavy, RN, BSN, CEN [facility] [employer] [phone]     . Patient Stated     She would like to feel more safe in bathroom (3n1 in both bathrooms, a new shower seat, a hand held shower head and grab bar in shower would help this)    . Patient Stated     She would like to feel more safe getting into and out of the house at her back door. (a threshold ramp inside and handrail outside would help with this)      Next home visit planned for 09/23/2020 at 1:30 pm  Rowe Pavy, RN, BSN,  CEN [facility] [employer] [phone]

## 2020-09-01 ENCOUNTER — Other Ambulatory Visit: Payer: Self-pay

## 2020-09-01 ENCOUNTER — Encounter: Payer: Self-pay | Admitting: Podiatry

## 2020-09-01 ENCOUNTER — Ambulatory Visit (INDEPENDENT_AMBULATORY_CARE_PROVIDER_SITE_OTHER): Payer: Medicare (Managed Care) | Admitting: Podiatry

## 2020-09-01 DIAGNOSIS — L84 Corns and callosities: Secondary | ICD-10-CM | POA: Diagnosis not present

## 2020-09-01 DIAGNOSIS — B351 Tinea unguium: Secondary | ICD-10-CM

## 2020-09-01 DIAGNOSIS — R1032 Left lower quadrant pain: Secondary | ICD-10-CM | POA: Insufficient documentation

## 2020-09-01 DIAGNOSIS — K861 Other chronic pancreatitis: Secondary | ICD-10-CM | POA: Insufficient documentation

## 2020-09-01 DIAGNOSIS — M79676 Pain in unspecified toe(s): Secondary | ICD-10-CM

## 2020-09-01 DIAGNOSIS — Z860100 Personal history of colon polyps, unspecified: Secondary | ICD-10-CM | POA: Insufficient documentation

## 2020-09-01 DIAGNOSIS — R1013 Epigastric pain: Secondary | ICD-10-CM | POA: Insufficient documentation

## 2020-09-01 DIAGNOSIS — M79671 Pain in right foot: Secondary | ICD-10-CM

## 2020-09-01 DIAGNOSIS — Z800 Family history of malignant neoplasm of digestive organs: Secondary | ICD-10-CM | POA: Insufficient documentation

## 2020-09-01 DIAGNOSIS — R634 Abnormal weight loss: Secondary | ICD-10-CM | POA: Insufficient documentation

## 2020-09-01 DIAGNOSIS — K5901 Slow transit constipation: Secondary | ICD-10-CM | POA: Insufficient documentation

## 2020-09-01 DIAGNOSIS — R933 Abnormal findings on diagnostic imaging of other parts of digestive tract: Secondary | ICD-10-CM | POA: Insufficient documentation

## 2020-09-01 DIAGNOSIS — M79672 Pain in left foot: Secondary | ICD-10-CM

## 2020-09-01 DIAGNOSIS — L853 Xerosis cutis: Secondary | ICD-10-CM

## 2020-09-01 MED ORDER — AMMONIUM LACTATE 12 % EX LOTN: 1.0000 "application " | TOPICAL_LOTION | CUTANEOUS | 2 refills | Status: DC | PRN

## 2020-09-06 NOTE — Progress Notes (Signed)
Subjective: Doris Pacheco is a 79 y.o. female patient seen today painful corn(s) right 3rd digit and callus(es) b/l and painful mycotic nails b/l.  Pain interferes with ambulation. Aggravating factors include wearing enclosed shoe gear.   She states she lost her Silipos toe caps for both great toes. She is requesting refill of ammonium lactate lotion on today's visit. PCP is Dr. Ralene Ok.  She voices no new pedal concerns on today's visit.  Patient Active Problem List   Diagnosis Date Noted  . Chronic pancreatitis (HCC) 09/01/2020  . Epigastric pain 09/01/2020  . Family history of malignant neoplasm of gastrointestinal tract 09/01/2020  . Imaging of gastrointestinal tract abnormal 09/01/2020  . Left lower quadrant pain 09/01/2020  . Personal history of colonic polyps 09/01/2020  . Slow transit constipation 09/01/2020  . Weight decreased 09/01/2020  . Hypothyroidism 03/02/2016  . Hyperlipidemia 03/02/2016  . Closed fracture of right distal femur (HCC) 02/03/2016  . Abrasion, knee   . Fracture of femur, distal, right, closed (HCC) 02/02/2016  . 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 Anemia 02/02/2016  . Femoral distal fracture, right, closed, initial encounter 02/02/2016  . Essential hypertension   . Fall   . Osteoporosis 02/18/2015  . Vertebral fracture, osteoporotic (HCC) 02/18/2015  . Bilateral low back pain without sciatica 02/18/2015  . Midline low back pain without sciatica 01/08/2015  . Chest pain 07/30/2013  . Hypertension   . Anxiety   . History of seasonal allergies   . History of CVA (cerebrovascular accident)   . GERD (gastroesophageal reflux disease)   . Arthritis   . 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 atorvastatin (LIPITOR) 20 MG tablet Take 20 mg by mouth daily.    . calcium carbonate (OSCAL) 1500 (600 Ca) MG TABS tablet Take 1,500 mg by  mouth daily with breakfast.     . cetirizine (ZYRTEC) 10 MG tablet     . diclofenac Sodium (VOLTAREN) 1 % GEL Apply 300 g topically as needed.    Marland Kitchen estrogens, conjugated, (PREMARIN) 1.25 MG tablet Take 1.25 mg by mouth daily.    . fluconazole (DIFLUCAN) 150 MG tablet Take 150 mg by mouth once. (Patient not taking: Reported on 07/30/2020)    . fluticasone (FLONASE) 50 MCG/ACT nasal spray     . hydrALAZINE (APRESOLINE) 10 MG tablet Take 10 mg by mouth 3 (three) times daily. Takes once a day    . HYDROcodone-acetaminophen (NORCO) 10-325 MG tablet TAKE 1 TABLET BY MOUTH 4 TIMES DAILY AS NEEDED (Patient not taking: Reported on 07/30/2020)    . ibuprofen (ADVIL,MOTRIN) 800 MG tablet     . Meth-Hyo-M Bl-Na Phos-Ph Sal (URIBEL) 118 MG CAPS Take 118 mg by mouth daily.     . Methen-Hyosc-Meth Blue-Na Phos (UROGESIC-BLUE) 81.6 MG TABS     . methocarbamol (ROBAXIN) 500 MG tablet     . methocarbamol (ROBAXIN) 750 MG tablet Take 750 mg by mouth 2 (two) times daily.     Marland Kitchen MICARDIS 80 MG tablet     . montelukast (SINGULAIR) 10 MG tablet Take 10 mg by mouth at bedtime.     Marland Kitchen omeprazole (PRILOSEC) 20 MG capsule Take 20 mg by mouth 2 (two) times daily.    . potassium chloride (K-DUR) 10 MEQ tablet     . potassium chloride (K-DUR,KLOR-CON) 10 MEQ tablet Take 10 mEq by mouth 2 (two) times  daily.     . prednisoLONE acetate (PRED FORTE) 1 % ophthalmic suspension INSTILL 1 DROP INTO EACH EYES 4 TIMES DAILY FOR 1 WEEK THEN 1 DROP INTO EACH EYES TWICE DAILY FOR 1 WEEK (Patient not taking: Reported on 07/30/2020)    . PREMARIN 0.625 MG tablet Take 0.625 mg by mouth daily.     Marland Kitchen tiZANidine (ZANAFLEX) 4 MG tablet Take 4 mg by mouth 2 (two) times daily.     Marland Kitchen triamterene-hydrochlorothiazide (MAXZIDE-25) 37.5-25 MG tablet Take 1 tablet by mouth daily.    . valsartan (DIOVAN) 320 MG tablet Take 320 mg by mouth daily. (Patient not taking: Reported on 07/30/2020)     No current facility-administered medications on file prior  to visit.    Allergies  Allergen Reactions  . Forteo [Parathyroid Hormone (Recomb)] Shortness Of Breath  . Tape Rash    Paper tape OK    Objective: Physical Exam  General: Doris Pacheco is a pleasant 79 y.o. [place] female, WD, WN in NAD. AAO x 3.   Vascular:  Capillary refill time to digits immediate b/l. Palpable DP pulses b/l. Faintly palpable PT pulses b/l. Pedal hair absent b/l Skin temperature gradient within normal limits b/l. No pain with calf compression b/l. Trace edema noted b/l feet.  Dermatological:  Pedal skin with normal turgor, texture and tone bilaterally. No open wounds bilaterally. No interdigital macerations bilaterally. Toenails 1-5 b/l elongated, discolored, dystrophic, thickened, crumbly with subungual debris and tenderness to dorsal palpation. Hyperkeratotic lesion(s) b/l lower extremities, L 2nd toe, R 3rd toe, submet head 1 left foot, submet head 1 right foot and submet head 5 right foot.  No erythema, no edema, no drainage, no flocculence. Pedal skin noted to be dry and flaky b/l LE.  Musculoskeletal:  Normal muscle strength 5/5 to all lower extremity muscle groups bilaterally. No pain crepitus or joint limitation noted with ROM b/l. Hammertoes noted to the 1-5 bilaterally.  Neurological:  Protective sensation intact 5/5 intact bilaterally with 10g monofilament b/l. Vibratory sensation intact b/l.  Assessment and Plan:  1. Pain due to onychomycosis of toenail   2. Corns and callosities   3. Xerosis cutis   4. Pain in both feet    -Examined patient. -Toenails 1-5 b/l were debrided in length and girth with sterile nail nippers and dremel without iatrogenic bleeding.  -Corn(s) R 3rd toe, L 2nd toe and callus(es) submet head 1 left foot, submet head 1 right foot  were pared utilizing sterile scalpel blade without incident. Total number debrided =4. -Dispensed silipos toe pads for b/l great toes to keep great toes separated from 2nd digits. Apply  every morning, remove every evening. -Ammonium Lactate Lotion 12% refilled on today's visit. -Patient to continue soft, supportive shoe gear daily. -Patient to report any pedal injuries to medical professional immediately. -Continue ammonium lactate 12% to be applied to both feet prn for dry skin. -Patient/POA to call should there be question/concern in the interim.  Return in about 3 months (around 11/30/2020) for painful corn(s)/callus(es).  Freddie Breech, DPM

## 2020-09-23 ENCOUNTER — Other Ambulatory Visit: Payer: Self-pay

## 2020-09-23 NOTE — Patient Outreach (Signed)
Aging Gracefully Program  RN Visit  09/23/2020  FLORENA KOZMA 1941-08-31 [id number]  Visit:   RN Home visit #3  Final visit  Start Time:   1330 End Time:   1400 Total Minutes:   30  Readiness To Change Score:     Universal RN Interventions: Calendar Distribution: Yes Exercise Review: Yes Medications: Yes Medication Changes: Yes Mood: Yes Pain: Yes PCP Advocacy/Support: No Fall Prevention: Yes Incontinence: Yes Clinician View Of Client Situation: Patient slow to answer door.  Home neat and clean. No exposure to covid.  I walked to the rear of the home and viewed the modifications.  New step into area where laudry room is. New handrails. Client View Of His/Her Situation: Reports she is doing well. Denies pain. Is currently active with PT for right shoulder.  Denies any changes in medications. Reports she had a nice hoilida. Reports community housing finished her modifictaions today.  Denies any recent falls.  Healthcare Provider Communication:none    Clinician View of Client Situation: Clinician View Of Client Situation: Patient slow to answer door.  Home neat and clean. No exposure to covid.  I walked to the rear of the home and viewed the modifications.  New step into area where laudry room is. New handrails. Client's View of His/Her Situation: Client View Of His/Her Situation: Reports she is doing well. Denies pain. Is currently active with PT for right shoulder.  Denies any changes in medications. Reports she had a nice hoilida. Reports community housing finished her modifictaions today.  Denies any recent falls.  Medication Assessment:no changes in medications    OT Update: In basket message sent to OT to inform of difficulty in shower and modifications are complete.   Session Summary: Nursing goals met. Encouraged patient to continue to be safe in home. Reviewed fall prevention and encouraged daily exercises to improve strength.  Goals Addressed            This  Visit's Progress   . COMPLETED: Aging Gracefully RN: Prevent falls       CLIENT/RN ACTION PLAN - [place] PREVENTION  Registered Nurse:  Reginia Naas RN Date: July 30, 2020  Client Name: Twylla Arceneaux Client ID:  February 24, 1941   Target Area:  FALL PREVENTION    Why Problem May Occur: -- previous falls in the past, some of which have caused serious injuries -- occasional knee pain, body aches -- arthritis and joint issues   Target Goal: Prevent future falls   STRATEGIES Coping Strategies Ideas  Change Positions Slowly: lying to sitting, sitting to standing . Changing positions can make people lightheaded. . When getting up in the morning sit for a few minutes, before standing.  . Stand for a few minutes before walking and hold on to sturdy furniture or countertop.  [name] job at taking your time and not rushing- keep up the great work! . Continue to use good posture when standing and when sitting  Drink water- don't become dehydrated . Dehydration can make people dizzy. . Ask your healthcare provider how much water you can drink. . Decrease caffeine, caffeine makes you urinate a lot, which can make you dehydrated. .   If you fall, tell someone . Tell a friend or family member even if you didn't get hurt. . Tell your healthcare provider if you fall.  They can help you figure out why. [name] job at always wearing your Life-Alert system-- keep up the good work!  Get your vision and hearing checked--  great job at staying on top of these important annual exams! . Poor vision and hearing loss can make people fall. . Glaucoma and diabetes can cause poor vision. .   Other .    Prevention Ideas  Review your Medicines . Many medicines can make you dizzy or sleepy and increase your risk of falling. . Your Aging Gracefully Nurse will look at your medications and see if you are taking any that might cause falling. . Always review your medications with your doctor each time you visit with him;  Always take an updated list of your medications to doctor's appointments . Your blood pressure medications could lead to dizziness- if you start experiencing dizziness, tell your doctor right away  Activity and Exercise . Aging Gracefully exercises- try to start incorporating some of the exercises we discussed into your regular routine- start slowly and gradually build up with each week.  Don't over-do with any activity or exercise program . Walking (inside or outside) . Dancing . Gardening . Housework:  cooking, Education administrator, Medical sales representative . Exercise while watching TV . Swimming or water aerobics. . Ask your doctor if Physical therapy would be a good idea for you  Strengthen Bones . Exercise makes your bones stronger. . Vitamin D and Calcium make your bones stronger.  Ask your Healthcare provider if you should be taking them. . Your body makes vitamin D from the sun.  Sit in the sun for 10 minutes every day (without sunscreen). .   Control your urine N/A . Prevent constipation . Cut back on caffeinated drinks. . Don't wait to urinate. . If diabetic, control your blood sugar. . Ask your Aging Gracefully Nurse and Healthcare Provider  about urine control. .   Control your blood sugar (if you are diabetic) N/A . High blood sugar can cause frequent urination, poor vision, and numbness in your feet, which can make you fall.   . Low blood sugar can cause confusion and dizziness. .   Other coping strategies 1. Keep your pathways in your home free of clutter and clear for walking 2.  Always wear good sturdy shoes with good soles- especially if you are going outside of your home 3. Always pay attention to weather conditions and develop a plan for bad weather, especially if surfaces may be wet or slippery 4. Keep up the great work at using your cane and walker    PRACTICE It is important to practice the strategies so we can determine if they will be effective in helping to reach the goal.    Follow these  specific recommendations:  Continue thinking about things you can do in your personal situation that would decrease your risk of falling, so we can talk about these during future visits together.  Read over the attached information and if you have any questions or ideas, write them down so you don't forget, so we can discuss when we visit next time.      If strategy does not work the first time, try it again.     We may make some changes over the next few sessions.       Oneta Rack, RN, BSN, [facility]  [phone]    08/26/2020   No falls, reports slow to move about.  Interventions:  Provided written aging gracefully exercises. Reviewed and demonstrated exercises.  Patient voiced understanding.  Reviewed importance of doing exercises daily. Provided calender and my contact information.  Tomasa Rand,  RN, BSN, CEN [place] Coordinator [phone]   09/23/2020  No falls. Reports feeling stronger doing home exercises. Goal completed. Closed to nursing at this time.        Tomasa Rand, RN, BSN, CEN [facility] [employer] [phone]

## 2020-11-02 ENCOUNTER — Other Ambulatory Visit: Payer: Self-pay

## 2020-11-02 ENCOUNTER — Other Ambulatory Visit: Payer: TRICARE For Life (TFL) | Admitting: Occupational Therapy

## 2020-11-02 NOTE — Patient Outreach (Signed)
Aging Gracefully Program  OT FINAL Visit  11/02/2020  Doris Pacheco 02-03-1941 [id number]  Visit:  3- Third Visit  Start Time:  1400 End Time:  1445 Total Minutes:  45  Readiness to Change:  Readiness to Change Score: 10   Durable Medical Equipment: Durable Medical Equipment: Shower Chair With Back Durable Medical Equipment Distribution Date: 11/02/20  Patient Education: Education Provided: Yes Education Details: Provided pt with "Tips for Aging at Home" book Person(s) Educated: Patient Comprehension: Verbalized Understanding  Goals: Goals Addressed            This Visit's Progress   . COMPLETED: Patient Stated       She would like to feel more safe in bathroom (3n1 in both bathrooms, a new shower seat, a hand held shower head and grab bar in shower would help this)--she got her 3n1 several weeks ago (delivered to her house), extra grab bars were put in and a hand held shower.  ACTION PLANNING - BATHING  Target Problem Area: Decreased safety with showering  Why Problem May Occur: No shower seat, decreased balance   Target Goal: Safety and independence with showering   STRATEGIES Saving Your Energy: DO: DON'T:  Use a shower seat Minimize standing while bathing, it uses more energy  Keep all items you'll need within easy reach Rush   Modifying your home environment and making it safe: DO: DON'T:  Use grab bars in the shower    Place a rubber mat the size you need in the shower Place loose rugs in the bathroom- they can trip you or your walker/cane can get caught on them  Make sure the bathroom is well lit    Use handheld shower head    Simplifying the way you set up tasks or daily routines: DO: DON'T:  Plan to shower before you're overly tired Rush through [employer] all items before getting started    Practice It is important to practice the strategies so we can determine if they will be effective in helping to reach your goal. Follow  these specific recommendations: 1.Use your new shower seat. 2.Use a non-skid mat or non-slip socks (you showed me) when in the shower.  If a strategy does not work the first time, try it again and again (and maybe again). Ignacia Palma, OTR/L       11/02/2020     . COMPLETED: Patient Stated       She would like to feel more safe getting into and out of the house at her back door. (a threshold ramp inside and handrail outside would help with this)       Post Clinical Reasoning: Client Action (Goal) One Interventions: Ms Knittel was happy to get her new shower seat. She received her 3n1 several weeks ago (delivered to her house) Did Client Try?: Yes Targeted Problem Area Status: A Lot Better Clinician View Of Client Situation:: Ms. Mosey had a fall a few days ago in the shower bruising her right inner thigh and right eye--she had to use her alert button to call for help because he cannot get up off the floor by herself due to her bad knees. She continues of move slowly and cautiously. She is receiving HHPT. She said she is very grateful for all the AG program has done for her. Client View Of His/Her Situation:: Ms. Spohr was very happy with her new tub seat (which I adjusted the height of to be the best for her). She  is appreciative of all that the program has done for her. She was sad that this was the end fo the program for her. Next Visit Plan:: This was the last visit,

## 2020-11-02 NOTE — Patient Instructions (Signed)
She would like to feel more safe in bathroom (3n1 in both bathrooms, a new shower seat, a hand held shower head and grab bar in shower would help this)--she got her 3n1 several weeks ago (delivered to her house), extra grab bars were put in and a hand held shower.  ACTION PLANNING - BATHING  Target Problem Area: Decreased safety with showering  Why Problem May Occur: No shower seat, decreased balance   Target Goal: Safety and independence with showering   STRATEGIES Saving Your Energy: DO: DON'T:  Use a shower seat Minimize standing while bathing, it uses more energy  Keep all items you'll need within easy reach Rush   Modifying your home environment and making it safe: DO: DON'T:  Use grab bars in the shower    Place a rubber mat the size you need in the shower Place loose rugs in the bathroom- they can trip you or your walker/cane can get caught on them  Make sure the bathroom is well lit    Use handheld shower head    Simplifying the way you set up tasks or daily routines: DO: DON'T:  Plan to shower before you're overly tired Rush through [employer] all items before getting started    Practice It is important to practice the strategies so we can determine if they will be effective in helping to reach your goal. Follow these specific recommendations: 1.Use your new shower seat. 2.Use a non-skid mat or non-slip socks (you showed me) when in the shower.  If a strategy does not work the first time, try it again and again (and maybe again). Ignacia Palma, OTR/L       11/02/2020

## 2020-11-10 ENCOUNTER — Other Ambulatory Visit: Payer: Self-pay

## 2020-11-10 NOTE — Patient Outreach (Signed)
Aging Gracefully Program  11/10/2020  Doris Pacheco May 28, 1941 [id number]  [facility] Evaluation Interviewer made contact with patient. Aging Gracefully 5 month survey scheduled for 11/11/20.  Baruch Gouty Care Management Assistant [phone]

## 2020-11-11 ENCOUNTER — Other Ambulatory Visit: Payer: Self-pay

## 2020-11-11 NOTE — Patient Outreach (Signed)
Aging Gracefully Program  11/11/2020  Doris Pacheco 09-25-1940 [id number]  [facility] Evaluation Interviewer made contact with patient. Aging Gracefully 5 month survey completed.   Interviewer will send referral to Baldo Ash with [facility] for follow up.  Baruch Gouty Care Management Assistant [phone]

## 2020-12-02 ENCOUNTER — Ambulatory Visit: Payer: TRICARE For Life (TFL) | Admitting: Podiatry

## 2020-12-08 ENCOUNTER — Ambulatory Visit: Payer: TRICARE For Life (TFL) | Admitting: Podiatry

## 2020-12-08 ENCOUNTER — Ambulatory Visit (INDEPENDENT_AMBULATORY_CARE_PROVIDER_SITE_OTHER): Payer: Medicare Other | Admitting: Podiatry

## 2020-12-08 ENCOUNTER — Encounter: Payer: Self-pay | Admitting: Podiatry

## 2020-12-08 ENCOUNTER — Other Ambulatory Visit: Payer: Self-pay

## 2020-12-08 DIAGNOSIS — M79671 Pain in right foot: Secondary | ICD-10-CM | POA: Diagnosis not present

## 2020-12-08 DIAGNOSIS — L84 Corns and callosities: Secondary | ICD-10-CM

## 2020-12-08 DIAGNOSIS — M79672 Pain in left foot: Secondary | ICD-10-CM | POA: Diagnosis not present

## 2020-12-08 DIAGNOSIS — M79676 Pain in unspecified toe(s): Secondary | ICD-10-CM | POA: Diagnosis not present

## 2020-12-08 DIAGNOSIS — B351 Tinea unguium: Secondary | ICD-10-CM

## 2020-12-13 NOTE — Progress Notes (Signed)
Subjective: Doris Pacheco is a 80 y.o. female patient seen today painful corn(s) right 3rd digit and callus(es) b/l and painful mycotic nails b/l.  Pain interferes with ambulation. Aggravating factors include wearing enclosed shoe gear.   She does not monitor blood glucose on a daily basis.   PCP is Dr. Ralene Ok.  She voices no new pedal concerns on today's visit.  Allergies  Allergen Reactions  . Forteo [Parathyroid Hormone (Recomb)] Shortness Of Breath  . Other     Other reaction(s): rash  . Tape Rash    Paper tape OK    Objective: Physical Exam  General: Doris Pacheco is a pleasant 80 y.o. [place] female, WD, WN in NAD. AAO x 3.   Vascular:  Capillary refill time to digits immediate b/l. Palpable DP pulses b/l. Faintly palpable PT pulses b/l. Pedal hair absent b/l Skin temperature gradient within normal limits b/l. No pain with calf compression b/l. Trace edema noted b/l feet.  Dermatological:  Pedal skin with normal turgor, texture and tone bilaterally. No open wounds bilaterally. No interdigital macerations bilaterally. Toenails 1-5 b/l elongated, discolored, dystrophic, thickened, crumbly with subungual debris and tenderness to dorsal palpation. Hyperkeratotic lesion(s) b/l lower extremities, L 2nd toe, R 3rd toe, submet head 1 left foot, submet head 1 right foot and submet head 5 right foot.  No erythema, no edema, no drainage, no flocculence. Pedal skin noted to be dry and flaky b/l LE.  Musculoskeletal:  Normal muscle strength 5/5 to all lower extremity muscle groups bilaterally. No pain crepitus or joint limitation noted with ROM b/l. Hammertoes noted to the 1-5 bilaterally.  Neurological:  Protective sensation intact 5/5 intact bilaterally with 10g monofilament b/l. Vibratory sensation intact b/l.  Assessment and Plan:  1. Pain due to onychomycosis of toenail   2. Corns and callosities   3. Pain in both feet    -Examined patient. -Toenails 1-5 b/l were  debrided in length and girth with sterile nail nippers and dremel without iatrogenic bleeding.  -Corn(s) R 3rd toe, L 2nd toe and callus(es) submet head 1 left foot, submet head 1 right foot  were pared utilizing sterile scalpel blade without incident. Total number debrided =4. -Patient to continue soft, supportive shoe gear daily. -Patient to report any pedal injuries to medical professional immediately. -Patient/POA to call should there be question/concern in the interim.  Return in about 3 months (around 03/10/2021).  Freddie Breech, DPM

## 2021-03-15 ENCOUNTER — Other Ambulatory Visit: Payer: Self-pay

## 2021-03-15 NOTE — Patient Outreach (Signed)
Aging Gracefully Program  03/15/2021  Doris Pacheco Jun 02, 1941 [id number]  [facility] Evaluation Interviewer made contact with patient. Aging Gracefully final survey scheduled for 03/16/21 at 1:00 o'clock.  [facility] Care Management Assistant  [phone]

## 2021-03-16 ENCOUNTER — Other Ambulatory Visit: Payer: Self-pay

## 2021-03-16 NOTE — Patient Outreach (Signed)
Aging Gracefully Program  03/16/2021  LUDELLA PRANGER 11/01/1940 [id number]  [facility] Evaluation Interviewer made contact with patient. Aging Gracefully final survey completed.   Interviewer will send referral to Rod Holler with [facility].   [facility] Care Management Assistant  [phone]

## 2021-03-30 ENCOUNTER — Other Ambulatory Visit: Payer: Self-pay

## 2021-03-30 ENCOUNTER — Encounter: Payer: Self-pay | Admitting: Podiatry

## 2021-03-30 ENCOUNTER — Ambulatory Visit (INDEPENDENT_AMBULATORY_CARE_PROVIDER_SITE_OTHER): Payer: Medicare Other | Admitting: Podiatry

## 2021-03-30 DIAGNOSIS — L84 Corns and callosities: Secondary | ICD-10-CM | POA: Diagnosis not present

## 2021-03-30 DIAGNOSIS — B351 Tinea unguium: Secondary | ICD-10-CM

## 2021-03-30 DIAGNOSIS — M79672 Pain in left foot: Secondary | ICD-10-CM | POA: Diagnosis not present

## 2021-03-30 DIAGNOSIS — M79676 Pain in unspecified toe(s): Secondary | ICD-10-CM | POA: Diagnosis not present

## 2021-03-30 DIAGNOSIS — M79671 Pain in right foot: Secondary | ICD-10-CM | POA: Diagnosis not present

## 2021-04-02 NOTE — Progress Notes (Signed)
Subjective: Doris Pacheco is a pleasant 80 y.o. female patient seen today for painful corn right 3rd toe, left 2nd toe, calluses b/l feet, painful thick toenails that are difficult to trim. Pain interferes with ambulation. Aggravating factors include wearing enclosed shoe gear. Pain is relieved with periodic professional debridement.  She voices no new pedal problems on today's visit.  Allergies  Allergen Reactions   Forteo [Parathyroid Hormone (Recomb)] Shortness Of Breath   Other     Other reaction(s): rash   Tape Rash    Paper tape OK    Objective: Physical Exam  General: Doris Pacheco is a pleasant 80 y.o. African American female, WD, WN in NAD. AAO x 3.   Vascular:  Capillary refill time to digits immediate b/l. Palpable DP pulse(s) b/l lower extremities Faintly palpable PT pulse(s) b/l lower extremities. Pedal hair absent. Lower extremity skin temperature gradient within normal limits. No pain with calf compression b/l. Trace edema noted b/l lower extremities.  Dermatological:  Pedal skin with normal turgor, texture and tone b/l lower extremities No open wounds b/l lower extremities No interdigital macerations b/l lower extremities Toenails 1-5 b/l elongated, discolored, dystrophic, thickened, crumbly with subungual debris and tenderness to dorsal palpation. Hyperkeratotic lesion(s) L 2nd toe, R 3rd toe, submet head 1 left foot, submet head 1 right foot, and submet head 5 right foot.  No erythema, no edema, no drainage, no fluctuance.  Musculoskeletal:  Normal muscle strength 5/5 to all lower extremity muscle groups bilaterally. No pain crepitus or joint limitation noted with ROM b/l. Hammertoe(s) noted to the 1-5 bilaterally.  Neurological:  Protective sensation intact 5/5 intact bilaterally with 10g monofilament b/l. Vibratory sensation intact b/l.  Assessment and Plan:  1. Pain due to onychomycosis of toenail   2. Corns and callosities   3. Pain in both feet     -No new  findings. No new orders. -Patient to continue soft, supportive shoe gear daily. -Toenails 1-5 b/l were debrided in length and girth with sterile nail nippers and dremel without iatrogenic bleeding.  -Corn(s) L 2nd toe and R 3rd toe and callus(es) submet head 1 left foot, submet head 1 right foot, and submet head 5 right foot were pared utilizing sterile scalpel blade without incident. Total number debrided =5. -Patient to report any pedal injuries to medical professional immediately. -Patient/POA to call should there be question/concern in the interim.  Return in about 3 months (around 06/30/2021).  Freddie Breech, DPM

## 2021-05-10 ENCOUNTER — Encounter (HOSPITAL_COMMUNITY): Payer: Self-pay | Admitting: Emergency Medicine

## 2021-05-10 ENCOUNTER — Emergency Department (HOSPITAL_COMMUNITY): Payer: Medicare Other

## 2021-05-10 ENCOUNTER — Other Ambulatory Visit: Payer: Self-pay

## 2021-05-10 ENCOUNTER — Emergency Department (HOSPITAL_COMMUNITY)
Admission: EM | Admit: 2021-05-10 | Discharge: 2021-05-11 | Disposition: A | Discharge status: Home / Self Care | Payer: Medicare Other | Attending: Emergency Medicine | Admitting: Emergency Medicine

## 2021-05-10 DIAGNOSIS — Z96651 Presence of right artificial knee joint: Secondary | ICD-10-CM | POA: Diagnosis not present

## 2021-05-10 DIAGNOSIS — Z87891 Personal history of nicotine dependence: Secondary | ICD-10-CM | POA: Insufficient documentation

## 2021-05-10 DIAGNOSIS — Z96642 Presence of left artificial hip joint: Secondary | ICD-10-CM | POA: Insufficient documentation

## 2021-05-10 DIAGNOSIS — R109 Unspecified abdominal pain: Secondary | ICD-10-CM | POA: Diagnosis not present

## 2021-05-10 DIAGNOSIS — E039 Hypothyroidism, unspecified: Secondary | ICD-10-CM | POA: Insufficient documentation

## 2021-05-10 DIAGNOSIS — Z79899 Other long term (current) drug therapy: Secondary | ICD-10-CM | POA: Insufficient documentation

## 2021-05-10 DIAGNOSIS — R531 Weakness: Secondary | ICD-10-CM | POA: Insufficient documentation

## 2021-05-10 DIAGNOSIS — I10 Essential (primary) hypertension: Secondary | ICD-10-CM | POA: Insufficient documentation

## 2021-05-10 DIAGNOSIS — R059 Cough, unspecified: Secondary | ICD-10-CM | POA: Diagnosis not present

## 2021-05-10 LAB — CBC WITH DIFFERENTIAL/PLATELET
Abs Immature Granulocytes: 0.08 10*3/uL — ABNORMAL HIGH (ref 0.00–0.07)
Basophils Absolute: 0 10*3/uL (ref 0.0–0.1)
Basophils Relative: 1 %
Eosinophils Absolute: 0.1 10*3/uL (ref 0.0–0.5)
Eosinophils Relative: 1 %
HCT: 40.1 % (ref 36.0–46.0)
Hemoglobin: 12.7 g/dL (ref 12.0–15.0)
Immature Granulocytes: 1 %
Lymphocytes Relative: 31 %
Lymphs Abs: 1.9 10*3/uL (ref 0.7–4.0)
MCH: 27.4 pg (ref 26.0–34.0)
MCHC: 31.7 g/dL (ref 30.0–36.0)
MCV: 86.6 fL (ref 80.0–100.0)
Monocytes Absolute: 0.5 10*3/uL (ref 0.1–1.0)
Monocytes Relative: 9 %
Neutro Abs: 3.5 10*3/uL (ref 1.7–7.7)
Neutrophils Relative %: 57 %
Platelets: 261 10*3/uL (ref 150–400)
RBC: 4.63 MIL/uL (ref 3.87–5.11)
RDW: 14.1 % (ref 11.5–15.5)
WBC: 6 10*3/uL (ref 4.0–10.5)
nRBC: 0 % (ref 0.0–0.2)

## 2021-05-10 NOTE — ED Provider Notes (Signed)
Emergency Medicine Provider Triage Evaluation Note  Doris Pacheco , a 80 y.o. female  was evaluated in triage.  Pt complains of generally feeling overall weak and unwell.  She reports abdominal pain.  She has had 1 episode of vomiting.  No diarrhea.  This started yesterday.  She does report occasionally feeling short of breath.  She denies any known sick contacts..  Review of Systems  Positive: Weakness, vomiting, feeling unwell, abdominal pain Negative: Diarrhea, syncope  Physical Exam  BP (!) 152/93 (BP Location: Left Arm)   Pulse (!) 104   Temp 98.7 F (37.1 C)   Resp 20   SpO2 99%  Gen:   Awake, no distress   Resp:  Normal effort  MSK:   Moves extremities without difficulty  Other:  Speech is not slurred.  Medical Decision Making  Medically screening exam initiated at 9:47 PM.  Appropriate orders placed.  Doris Pacheco was informed that the remainder of the evaluation will be completed by another provider, this initial triage assessment does not replace that evaluation, and the importance of remaining in the ED until their evaluation is complete.  Note: Portions of this report may have been transcribed using voice recognition software. Every effort was made to ensure accuracy; however, inadvertent computerized transcription errors may be present    Norman Clay 05/10/21 2149    Koleen Distance, MD 05/10/21 [phone]

## 2021-05-10 NOTE — ED Triage Notes (Signed)
Patient reports emesis x2 today with fatigue and generalized body aches , denies fever or chills , respirations unlabored.

## 2021-05-11 ENCOUNTER — Emergency Department (HOSPITAL_COMMUNITY): Payer: Medicare Other

## 2021-05-11 DIAGNOSIS — R531 Weakness: Secondary | ICD-10-CM | POA: Diagnosis not present

## 2021-05-11 LAB — COMPREHENSIVE METABOLIC PANEL
ALT: 13 U/L (ref 0–44)
AST: 15 U/L (ref 15–41)
Albumin: 3.7 g/dL (ref 3.5–5.0)
Alkaline Phosphatase: 43 U/L (ref 38–126)
Anion gap: 9 (ref 5–15)
BUN: 30 mg/dL — ABNORMAL HIGH (ref 8–23)
CO2: 24 mmol/L (ref 22–32)
Calcium: 9.6 mg/dL (ref 8.9–10.3)
Chloride: 107 mmol/L (ref 98–111)
Creatinine, Ser: 1.14 mg/dL — ABNORMAL HIGH (ref 0.44–1.00)
GFR, Estimated: 49 mL/min — ABNORMAL LOW (ref >60)
Glucose, Bld: 100 mg/dL — ABNORMAL HIGH (ref 70–99)
Potassium: 3.3 mmol/L — ABNORMAL LOW (ref 3.5–5.1)
Sodium: 140 mmol/L (ref 135–145)
Total Bilirubin: 0.5 mg/dL (ref 0.3–1.2)
Total Protein: 7 g/dL (ref 6.5–8.1)

## 2021-05-11 LAB — TROPONIN I (HIGH SENSITIVITY)
Troponin I (High Sensitivity): 6 ng/L (ref <18)
Troponin I (High Sensitivity): 7 ng/L (ref <18)

## 2021-05-11 LAB — LIPASE, BLOOD: Lipase: 25 U/L (ref 11–51)

## 2021-05-11 MED ORDER — IOHEXOL 300 MG/ML  SOLN
75.0000 mL | Freq: Once | INTRAMUSCULAR | Status: AC | PRN
  Administered 2021-05-11: 75 mL via INTRAVENOUS

## 2021-05-11 NOTE — ED Notes (Signed)
Pt transported back to CT via stretcher. Per CT tech, PIV infiltrated during CT. Ice pack in place. PIV removed by radiology RN.

## 2021-05-11 NOTE — Progress Notes (Signed)
This patient has received 75 ml's of IV Omni 300 contrast extravasation into rt arm during a CT abdomen/pelvis exam.  The exam was performed on (date) 05/11/2021  Site / affected area assessed by Mickie Kay, NP.

## 2021-05-11 NOTE — Discharge Instructions (Addendum)
Follow up with Primary care provider  Keep close eye to right upper arm. Cool compress to area. 4-5 times daily  Return for new or worsening symptoms

## 2021-05-11 NOTE — ED Notes (Signed)
Pt given [place] sandwich per her request for PO challenge.

## 2021-05-11 NOTE — ED Provider Notes (Signed)
[facility] EMERGENCY DEPARTMENT Provider Note   CSN: [id number] Arrival date & time: 05/10/21  2103    History Chief Complaint  Patient presents with   Emesis/Fatigue    Doris Pacheco is a 80 y.o. female with past medical history significant for anxiety, GERD, hypertension, CVA, chronic pancreatitis who presents for evaluation of feeling generally weak and unwell.  Symptoms began yesterday.  Had 1 episode of NBNB emesis.  Has had some intermittent abdominal cramping which is now resolved.  No diarrhea.  Has a mild cough.  Per daughter patient did fall 2 to 3 weeks ago however has been at her baseline orientation since.  Daughter states patient has history of falls.  Patient lives on her own.  She denies any recent falls.  She is ambulatory with walker at baseline.  No headache, lightheadedness, dizziness, facial droop, chest pain, shortness of breath, dysuria, hematuria, numbness or weakness.  Denies additional aggravating or alleviating factors.  No known COVID exposures  History obtained from patient, daughter in room and past medical records.  No interpreter used  HPI     Past Medical History:  Diagnosis Date   Anxiety    Arthritis    GERD (gastroesophageal reflux disease)    History of seasonal allergies    Hypertension    takes meds daily   Stroke [facility])    tia    Patient Active Problem List   Diagnosis Date Noted   Chronic pancreatitis (HCC) 09/01/2020   Epigastric pain 09/01/2020   Family history of malignant neoplasm of gastrointestinal tract 09/01/2020   Imaging of gastrointestinal tract abnormal 09/01/2020   Left lower quadrant pain 09/01/2020   Personal history of colonic polyps 09/01/2020   Slow transit constipation 09/01/2020   Weight decreased 09/01/2020   Hypothyroidism 03/02/2016   Hyperlipidemia 03/02/2016   Closed fracture of right distal femur (HCC) 02/03/2016   Abrasion, knee    Fracture of femur, distal, right, closed (HCC)  02/02/2016   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   Anemia 02/02/2016   Femoral distal fracture, right, closed, initial encounter 02/02/2016   Essential hypertension    Fall    Osteoporosis 02/18/2015   Vertebral fracture, osteoporotic (HCC) 02/18/2015   Bilateral low back pain without sciatica 02/18/2015   Midline low back pain without sciatica 01/08/2015   Chest pain 07/30/2013   Hypertension    Anxiety    History of seasonal allergies    History of CVA (cerebrovascular accident)    GERD (gastroesophageal reflux disease)    Arthritis    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    Past Surgical History:  Procedure Laterality Date   ABDOMINAL HYSTERECTOMY     I & D KNEE WITH POLY EXCHANGE Right 10/26/2012   Procedure: IRRIGATION AND DEBRIDEMENT KNEE WITH POLY EXCHANGE;  Surgeon: Nestor Lewandowsky, MD;  Location: [facility] OR;  Service: Orthopedics;  Laterality: Right;  poly exchange   INTRAOCULAR LENS INSERTION     right eye   JOINT REPLACEMENT     left hip, right knee   ORIF FEMUR FRACTURE Right 02/03/2016   Procedure: OPEN REDUCTION INTERNAL FIXATION (ORIF) DISTAL FEMUR FRACTURE;  Surgeon: Gean Birchwood, MD;  Location: [facility] OR;  Service: Orthopedics;  Laterality: Right;   PATELLECTOMY Right 10/26/2012   Procedure: PATELLECTOMY;  Surgeon: Nestor Lewandowsky, MD;  Location: [facility] OR;  Service: Orthopedics;  Laterality: Right;   ROTATOR CUFF REPAIR     bilateral   TONSILLECTOMY  OB History     Gravida  3   Para  3   Term      Preterm      AB      Living  3      SAB      IAB      Ectopic      Multiple      Live Births              Family History  Problem Relation Age of Onset   Cancer Mother        COLON   Diabetes Sister    Stroke Sister    Hypertension Father    Pneumonia Father     Social History   Tobacco Use   Smoking status: Former   Smokeless tobacco: Never  Building services engineer Use: Never used  Substance Use Topics   Alcohol  use: No    Alcohol/week: 0.0 standard drinks   Drug use: No    Home Medications Prior to Admission medications   Medication Sig Start Date End Date Taking? Authorizing Provider  ammonium lactate (LAC-HYDRIN) 12 % lotion Apply 1 application topically as needed for dry skin. 09/01/20   Freddie Breech, DPM  Ascorbic Acid (VITAMIN C) 100 MG tablet     [provider]  atenolol (TENORMIN) 25 MG tablet     [provider]  atorvastatin (LIPITOR) 20 MG tablet     [provider]  calcium carbonate (OSCAL) 1500 (600 Ca) MG TABS tablet Take 1,500 mg by mouth daily with breakfast.     [provider]  cetirizine (ZYRTEC ALLERGY) 10 MG tablet     [provider]  Cholecalciferol (VITAMIN D3) 25 MCG (1000 UT) CAPS     [provider]  diclofenac Sodium (VOLTAREN) 1 % GEL Apply 300 g topically as needed. 03/02/20   [provider]  docusate sodium (COLACE) 100 MG capsule     [provider]  estrogens, conjugated, (PREMARIN) 1.25 MG tablet Take 1.25 mg by mouth daily.    [provider]  fluconazole (DIFLUCAN) 100 MG tablet     [provider]  fluticasone (FLONASE) 50 MCG/ACT nasal spray  07/30/18   [provider]  hydrALAZINE (APRESOLINE) 10 MG tablet     [provider]  HYDROcodone-acetaminophen (NORCO) 10-325 MG tablet     [provider]  hydrocortisone 2.5 % cream  02/23/21   [provider]  ibuprofen (ADVIL,MOTRIN) 800 MG tablet  08/09/18   [provider]  ketoconazole (NIZORAL) 2 % cream  02/23/21   [provider]  meloxicam (MOBIC) 15 MG tablet     [provider]  Meth-Hyo-M Bl-Na Phos-Ph Sal (URIBEL) 118 MG CAPS Take 118 mg by mouth daily.     [provider]  Methen-Hyosc-Meth Blue-Na Phos (UROGESIC-BLUE) 81.6 MG TABS  09/17/18   [provider]  methocarbamol (ROBAXIN) 500 MG tablet  09/17/18   [provider]  methocarbamol (ROBAXIN) 750 MG tablet Take 750 mg by mouth 2 (two) times daily.  12/31/19   [provider]  montelukast (SINGULAIR) 10 MG tablet Take 10 mg by mouth at bedtime.     [provider]  omeprazole (PRILOSEC) 20 MG capsule Take 20 mg by mouth 2 (two) times daily. 03/10/21   [provider]  omeprazole (PRILOSEC) 40 MG capsule  05/16/19   [provider]  potassium chloride (K-DUR,KLOR-CON) 10 MEQ tablet  Take 10 mEq by mouth 2 (two) times daily.     [provider]  potassium chloride (KLOR-CON 10) 10 MEQ tablet     [provider]  prednisoLONE acetate (PRED FORTE) 1 % ophthalmic suspension INSTILL 1 DROP INTO EACH EYES 4 TIMES DAILY FOR 1 WEEK THEN 1 DROP INTO EACH EYES TWICE DAILY FOR 1 WEEK Patient not taking: Reported on 07/30/2020 02/11/20   [provider]  PREMARIN 0.625 MG tablet Take 0.625 mg by mouth daily.  05/27/20   [provider]  telmisartan (MICARDIS) 80 MG tablet     [provider]  tiZANidine (ZANAFLEX) 4 MG tablet Take 4 mg by mouth 2 (two) times daily.  01/07/20   [provider]  traMADol Janean Sark(ULTRAM) 50 MG tablet  03/26/21   [provider]  traMADol HCl 100 MG TABS  09/16/19   [provider]  triamterene-hydrochlorothiazide (MAXZIDE-25) 37.5-25 MG tablet Take 1 tablet by mouth daily. 03/14/20   [provider]  valsartan (DIOVAN) 320 MG tablet Take 320 mg by mouth daily. Patient not taking: Reported on 07/30/2020    [provider]  vitamin E 180 MG (400 UNITS) capsule     [provider]    Allergies    Forteo [parathyroid hormone (recomb)], Other, and Tape  Review of Systems   Review of Systems  Constitutional:  Positive for fatigue. Negative for activity change, appetite change, chills, diaphoresis, fever and unexpected weight change.  HENT: Negative.    Respiratory: Negative.    Cardiovascular: Negative.   Gastrointestinal:   Positive for abdominal pain (resolved), nausea and vomiting.  Genitourinary: Negative.   Musculoskeletal: Negative.   Skin: Negative.   Neurological: Negative.   All other systems reviewed and are negative.  Physical Exam Updated Vital Signs BP (!) 150/80 (BP Location: Left Arm)   Pulse 85   Temp 98.4 F (36.9 C) (Oral)   Resp 16   SpO2 99%   Physical Exam Vitals and nursing note reviewed.  Constitutional:      General: She is not in acute distress.    Appearance: She is well-developed. She is not ill-appearing, toxic-appearing or diaphoretic.  HENT:     Head: Normocephalic and atraumatic.     Nose: Nose normal.     Mouth/Throat:     Mouth: Mucous membranes are moist.  Eyes:     Pupils: Pupils are equal, round, and reactive to light.  Cardiovascular:     Rate and Rhythm: Normal rate.     Pulses: Normal pulses.          Radial pulses are 2+ on the right side and 2+ on the left side.       Dorsalis pedis pulses are 2+ on the right side and 2+ on the left side.     Heart sounds: Normal heart sounds.  Pulmonary:     Effort: Pulmonary effort is normal. No respiratory distress.     Breath sounds: Normal breath sounds and air entry.  Abdominal:     General: Bowel sounds are normal. There is no distension.     Palpations: Abdomen is soft. There is no mass.     Tenderness: There is no abdominal tenderness. There is no right CVA tenderness, left CVA tenderness or guarding.     Comments: Soft, nontender  Musculoskeletal:        General: Normal range of motion.     Cervical back: Normal range of motion.     Comments:  No bony tenderness.  Moves all 4 extremities without difficulty  Skin:    General: Skin is warm and dry.     Capillary Refill: Capillary refill takes less than 2 seconds.     Comments: No rashes or lesions  Neurological:     General: No focal deficit present.     Mental Status: She is alert and oriented to person, place, and time.     Comments:  CN 2-12 grossly  intact No facial droop Equal strength Intact sensation Ambulatory at bedside    Psychiatric:        Mood and Affect: Mood normal.    ED Results / Procedures / Treatments   Labs (all labs ordered are listed, but only abnormal results are displayed) Labs Reviewed  COMPREHENSIVE METABOLIC PANEL - Abnormal; Notable for the following components:      Result Value   Potassium 3.3 (*)    Glucose, Bld 100 (*)    BUN 30 (*)    Creatinine, Ser 1.14 (*)    GFR, Estimated 49 (*)    All other components within normal limits  CBC WITH DIFFERENTIAL/PLATELET - Abnormal; Notable for the following components:   Abs Immature Granulocytes 0.08 (*)    All other components within normal limits  LIPASE, BLOOD  URINALYSIS, ROUTINE W REFLEX MICROSCOPIC  TROPONIN I (HIGH SENSITIVITY)  TROPONIN I (HIGH SENSITIVITY)    EKG EKG Interpretation  Date/Time:  Monday May 10 2021 21:48:47 EDT Ventricular Rate:  91 PR Interval:  172 QRS Duration: 72 QT Interval:  342 QTC Calculation: 420 R Axis:   40 Text Interpretation: Sinus rhythm with Premature atrial complexes Otherwise normal ECG No significant change since prior 11/19 Confirmed by Meridee Score [phone]) on 05/11/2021 7:10:10 AM  Radiology CT ABDOMEN PELVIS WO CONTRAST  Result Date: 05/11/2021 CLINICAL DATA:  Abdominal pain.  Emesis and fever. EXAM: CT ABDOMEN AND PELVIS WITHOUT CONTRAST TECHNIQUE: Multidetector CT imaging of the abdomen and pelvis was performed following the standard protocol without IV contrast. Exam was originally ordered as CT with contrast. Unfortunately 75 cc of Omnipaque 300 extravasated into the patient's right arm at the injection site. Patient was evaluated by Alwyn Ren, NP and a note has been placed in the chart. COMPARISON:  04/04/2010. FINDINGS: Lower chest: No acute abnormality. Hepatobiliary: No focal liver abnormality is seen. No gallstones, gallbladder wall thickening, or biliary dilatation. Pancreas:  Unremarkable. No pancreatic ductal dilatation or surrounding inflammatory changes. Spleen: Normal in size without focal abnormality. Adrenals/Urinary Tract: Normal appearance of the adrenal glands. No kidney stone, mass or hydronephrosis identified bilaterally. Urinary bladder is unremarkable. Stomach/Bowel: Stomach appears normal. The appendix is visualized and is unremarkable. No bowel wall thickening, inflammation, or distension. Vascular/Lymphatic: Aortic atherosclerosis. No aneurysm. No abdominopelvic adenopathy identified. Reproductive: Status post hysterectomy. No adnexal masses. Other: No free fluid or fluid collections. Musculoskeletal: Status post left hip arthroplasty. Multi level spondylosis identified throughout the visualized thoracolumbar spine. Stepwise retrolisthesis of T12 on L1 and L1 on L2 noted. Anterolisthesis of L3 on L4 is noted and there is anterolisthesis of L5 on S1. Multilevel degenerative disc disease. Lower chest: No acute abnormality. IMPRESSION: 1. No acute findings identified within the abdomen or pelvis. 2. Aortic atherosclerosis. 3. Severe thoracolumbar spondylosis. Aortic Atherosclerosis (ICD10-I70.0). Electronically Signed   By: Signa Kell M.D.   On: 05/11/2021 10:33   DG Chest 2 View  Result Date: 05/10/2021 CLINICAL DATA:  Shortness of breath EXAM: CHEST - 2 VIEW COMPARISON:  08/14/2018 FINDINGS: Heart  and mediastinal contours are within normal limits. No focal opacities or effusions. No acute bony abnormality. Aortic atherosclerosis. IMPRESSION: No active cardiopulmonary disease. Electronically Signed   By: Charlett Nose M.D.   On: 05/10/2021 22:17    Procedures Procedures   Medications Ordered in ED Medications  iohexol (OMNIPAQUE) 300 MG/ML solution 75 mL (75 mLs Intravenous Contrast Given 05/11/21 0930)    ED Course  I have reviewed the triage vital signs and the nursing notes.  Pertinent labs & imaging results that were available during my care of the  patient were reviewed by me and considered in my medical decision making (see chart for details).  Unfortunately patient with extended wait out in the waiting room of approximately 10 hours prior to my assessment  For evaluation of generalized feeling unwell.  Did have 1 episode of emesis and some generalized abdominal pain which resolved while out in the waiting room.  She is afebrile, nonseptic, not ill-appearing.  She has a nonfocal neuro exam without deficits.  Her heart and lungs are clear.  Abdomen soft, nontender.  She is ambulatory at her baseline.  No melena or bright blood per rectum.  Work-up started from triage which I personally reviewed and interpreted:  CBC without leukocytosis, metabolic panel potassium 3.3, BUN 30, creatinine 1.14 Lipase 25 Troponin 6 >>7 Chest x-ray with any acute abnormality CT abdomen pelvis without acute abnormality EKG without ischemic changes   Did have IV contrast infiltration while in CT. Neurovascularly intact.  Warm.  No skin breakdown.  Ice placed by CT.  She assessed by radiology NP while in CT scanner.  IV removed by radiology RN.  Discussed with daughter at bedside as well as patient close watch of this area, ice, return for new or worsening symptoms  Patient appears otherwise well.  States she wants to go home.  She does not want to wait for a urine sample.  States she is gone to bathroom multiple times here and no one has Jackquline Bosch previously and she is ready to go.  She has no emesis throughout her 14-hour stay here in the emergency room.  She is tolerating p.o. intake without difficulty.  Discussed strict return precautions with patient and daughter.  They are agreeable.  Urged close follow-up with PCP whom daughter has already spoken with today.  She has no focal deficits to suggest CVA.  The patient has been appropriately medically screened and/or stabilized in the ED. I have low suspicion for any other emergent medical condition which would  require further screening, evaluation or treatment in the ED or require inpatient management.  Patient is hemodynamically stable and in no acute distress.  Patient able to ambulate in department prior to ED.  Evaluation does not show acute pathology that would require ongoing or additional emergent interventions while in the emergency department or further inpatient treatment.  I have discussed the diagnosis with the patient and answered all questions.  Pain is been managed while in the emergency department and patient has no further complaints prior to discharge.  Patient is comfortable with plan discussed in room and is stable for discharge at this time.  I have discussed strict return precautions for returning to the emergency department.  Patient was encouraged to follow-up with PCP/specialist refer to at discharge.     Clinical Course as of 05/11/21 1140  Tue May 11, 2021  [id number] 80 year old female here from last night for episode of fatigue and possibly abdominal pain.  She had some lab  work chest x-ray CT imaging that does not show any acute findings.  She is eating and drinking here and feels back to baseline.  Family will arrange for close outpatient follow-up. [MB]    Clinical Course User Index [MB] Terrilee Files, MD   MDM Rules/Calculators/A&P                           Final Clinical Impression(s) / ED Diagnoses Final diagnoses:  Weakness    Rx / DC Orders ED Discharge Orders     None        Reve Crocket A, PA-C 05/11/21 1140    Terrilee Files, MD 05/11/21 1926

## 2021-05-11 NOTE — Progress Notes (Signed)
  Evaluation after Contrast Extravasation  Patient seen and examined immediately after contrast extravasation while in CT 2 at [facility].  Exam: There is  swelling at the right upper arm/antecubital area.  There is no erythema. There is no discoloration. There are no blisters. There are no signs of decreased perfusion of the skin.  It is warm to touch.  The patient has full ROM in fingers.  Radial pulse is normal.  Per contrast extravasation protocol, I have instructed the patient to keep an ice pack on the area for 20-60 minutes at a time for about 48 hours.   Keep arm elevated as much as possible.   The patient understands to call the radiology department if there is: - increase in pain or swelling - changed or altered sensation - ulceration or blistering - increasing redness - warmth or increasing firmness - decreased tissue perfusion as noted by decreased capillary refill or discoloration of skin - decreased pulses peripheral to site   Mickie Kay, NP 05/11/2021 9:24 AM

## 2021-05-11 NOTE — ED Notes (Signed)
Patient was given a [place] Sandwich bag with a Ginger Ale.

## 2021-07-06 ENCOUNTER — Other Ambulatory Visit: Payer: Self-pay

## 2021-07-06 ENCOUNTER — Encounter: Payer: Self-pay | Admitting: Podiatry

## 2021-07-06 ENCOUNTER — Ambulatory Visit (INDEPENDENT_AMBULATORY_CARE_PROVIDER_SITE_OTHER): Payer: Medicare Other | Admitting: Podiatry

## 2021-07-06 DIAGNOSIS — L84 Corns and callosities: Secondary | ICD-10-CM

## 2021-07-06 DIAGNOSIS — M79676 Pain in unspecified toe(s): Secondary | ICD-10-CM | POA: Diagnosis not present

## 2021-07-06 DIAGNOSIS — Q828 Other specified congenital malformations of skin: Secondary | ICD-10-CM | POA: Diagnosis not present

## 2021-07-06 DIAGNOSIS — M79672 Pain in left foot: Secondary | ICD-10-CM

## 2021-07-06 DIAGNOSIS — M79671 Pain in right foot: Secondary | ICD-10-CM

## 2021-07-06 DIAGNOSIS — B351 Tinea unguium: Secondary | ICD-10-CM | POA: Diagnosis not present

## 2021-07-06 NOTE — Patient Instructions (Addendum)
You can purchase Skechers with stretchable uppers from [employer].

## 2021-07-11 NOTE — Progress Notes (Signed)
  Subjective:  Patient ID: Doris Pacheco, female    DOB: [birth year],  MRN: [id number]  Doris Pacheco presents to clinic today for corn(s) b/l feet , callus(es) left foot and painful mycotic nails.  Pain interferes with ambulation. Aggravating factors include wearing enclosed shoe gear. Painful toenails interfere with ambulation. Aggravating factors include wearing enclosed shoe gear. Pain is relieved with periodic professional debridement. Painful corns and calluses are aggravated when weightbearing with and without shoegear. Pain is relieved with periodic professional debridement.  She voices no new pedal problems on today's visit  PCP is Ralene Ok, MD , and last visit was 07/05/2021.  Allergies  Allergen Reactions   Forteo [Parathyroid Hormone (Recomb)] Shortness Of Breath   Other     Other reaction(s): rash   Tape Rash    Paper tape OK    Review of Systems: Negative except as noted in the HPI. Objective:   Constitutional Doris Pacheco is a pleasant 80 y.o. African American female, WD, WN in NAD. AAO x 3.   Vascular Capillary fill time to digits <3 seconds b/l lower extremities. Palpable DP pulse(s) b/l lower extremities Faintly palpable PT pulse(s) b/l lower extremities. Pedal hair absent. Lower extremity skin temperature gradient within normal limits. No pain with calf compression b/l. Trace edema noted BLE. No cyanosis or clubbing noted.  Neurologic Normal speech. Oriented to person, place, and time. Protective sensation intact 5/5 intact bilaterally with 10g monofilament b/l.  Dermatologic Skin warm and supple b/l lower extremities. No open wounds b/l LE. No interdigital macerations b/l lower extremities. Toenails 1-5 bilaterally elongated, discolored, dystrophic, thickened, and crumbly with subungual debris and tenderness to dorsal palpation. Hyperkeratotic lesion(s) L 5th toe, R 2nd toe, and R 3rd toe.  No erythema, no edema, no drainage, no fluctuance. Porokeratotic lesion(s)  submet head 1 left foot. No erythema, no edema, no drainage, no fluctuance.  Orthopedic: Normal muscle strength 5/5 to all lower extremity muscle groups bilaterally. Hammertoe(s) noted to the 1-5 left, R 2nd toe, R 3rd toe, R 4th toe, and R 5th toe.   Radiographs: None Assessment:   1. Pain due to onychomycosis of toenail   2. Corns   3. Porokeratosis   4. Pain in both feet    Plan:  Patient was evaluated and treated and all questions answered. Consent given for treatment as described below: -No new findings. No new orders. -Patient to continue soft, supportive shoe gear daily. -Mycotic toenails were debrided in length and girth with sterile nail nippers and dremel without iatrogenic bleeding. -Corn(s) L 5th toe, R 2nd toe, and R 3rd toe pared utilizing sterile scalpel blade without complication or incident. Total number debrided=3. -Painful porokeratotic lesion(s) submet head 1 left foot pared and enucleated with sterile scalpel blade without incident. Total number of lesions debrided=1. -Patient to report any pedal injuries to medical professional immediately. -Recommended [employer]chers shoes with stretchable uppers and memory foam insoles. -Patient/POA to call should there be question/concern in the interim.  Return in about 3 months (around 10/06/2021).  Freddie Breech, DPM

## 2021-09-05 ENCOUNTER — Other Ambulatory Visit: Payer: Self-pay

## 2021-09-05 ENCOUNTER — Emergency Department (HOSPITAL_COMMUNITY): Payer: Medicare Other

## 2021-09-05 ENCOUNTER — Emergency Department (HOSPITAL_COMMUNITY)
Admission: EM | Admit: 2021-09-05 | Discharge: 2021-09-05 | Disposition: A | Discharge status: Home / Self Care | Payer: Medicare Other | Attending: Emergency Medicine | Admitting: Emergency Medicine

## 2021-09-05 ENCOUNTER — Encounter (HOSPITAL_COMMUNITY): Payer: Self-pay

## 2021-09-05 DIAGNOSIS — Z87891 Personal history of nicotine dependence: Secondary | ICD-10-CM | POA: Insufficient documentation

## 2021-09-05 DIAGNOSIS — Z79899 Other long term (current) drug therapy: Secondary | ICD-10-CM | POA: Diagnosis not present

## 2021-09-05 DIAGNOSIS — Z86010 Personal history of colonic polyps: Secondary | ICD-10-CM | POA: Insufficient documentation

## 2021-09-05 DIAGNOSIS — R519 Headache, unspecified: Secondary | ICD-10-CM | POA: Diagnosis not present

## 2021-09-05 DIAGNOSIS — M545 Low back pain, unspecified: Secondary | ICD-10-CM | POA: Insufficient documentation

## 2021-09-05 DIAGNOSIS — I10 Essential (primary) hypertension: Secondary | ICD-10-CM | POA: Diagnosis not present

## 2021-09-05 DIAGNOSIS — E039 Hypothyroidism, unspecified: Secondary | ICD-10-CM | POA: Diagnosis not present

## 2021-09-05 DIAGNOSIS — W19XXXA Unspecified fall, initial encounter: Secondary | ICD-10-CM | POA: Insufficient documentation

## 2021-09-05 DIAGNOSIS — G8911 Acute pain due to trauma: Secondary | ICD-10-CM | POA: Insufficient documentation

## 2021-09-05 LAB — CBC WITH DIFFERENTIAL/PLATELET
Abs Immature Granulocytes: 0.14 10*3/uL — ABNORMAL HIGH (ref 0.00–0.07)
Basophils Absolute: 0 10*3/uL (ref 0.0–0.1)
Basophils Relative: 0 %
Eosinophils Absolute: 0.2 10*3/uL (ref 0.0–0.5)
Eosinophils Relative: 2 %
HCT: 40.6 % (ref 36.0–46.0)
Hemoglobin: 12.8 g/dL (ref 12.0–15.0)
Immature Granulocytes: 2 %
Lymphocytes Relative: 19 %
Lymphs Abs: 1.3 10*3/uL (ref 0.7–4.0)
MCH: 28.3 pg (ref 26.0–34.0)
MCHC: 31.5 g/dL (ref 30.0–36.0)
MCV: 89.6 fL (ref 80.0–100.0)
Monocytes Absolute: 0.6 10*3/uL (ref 0.1–1.0)
Monocytes Relative: 8 %
Neutro Abs: 4.5 10*3/uL (ref 1.7–7.7)
Neutrophils Relative %: 69 %
Platelets: 288 10*3/uL (ref 150–400)
RBC: 4.53 MIL/uL (ref 3.87–5.11)
RDW: 16.5 % — ABNORMAL HIGH (ref 11.5–15.5)
WBC: 6.6 10*3/uL (ref 4.0–10.5)
nRBC: 0 % (ref 0.0–0.2)

## 2021-09-05 MED ORDER — METHOCARBAMOL 500 MG PO TABS: 500.0000 mg | ORAL_TABLET | Freq: Two times a day (BID) | ORAL | 0 refills | Status: DC | PRN

## 2021-09-05 MED ORDER — HYDROCODONE-ACETAMINOPHEN 5-325 MG PO TABS: 1.0000 | ORAL_TABLET | Freq: Four times a day (QID) | ORAL | 0 refills | Status: DC | PRN

## 2021-09-05 MED ORDER — LIDOCAINE 5 % EX PTCH
1.0000 | MEDICATED_PATCH | CUTANEOUS | 0 refills | Status: DC
Start: 2021-09-05 — End: 2023-11-27

## 2021-09-05 MED ORDER — METHOCARBAMOL 500 MG PO TABS
500.0000 mg | ORAL_TABLET | Freq: Once | ORAL | Status: AC
  Administered 2021-09-05: 12:00:00 500 mg via ORAL
  Filled 2021-09-05: qty 1

## 2021-09-05 MED ORDER — ACETAMINOPHEN 325 MG PO TABS
650.0000 mg | ORAL_TABLET | Freq: Once | ORAL | Status: AC
  Administered 2021-09-05: 12:00:00 650 mg via ORAL
  Filled 2021-09-05: qty 2

## 2021-09-05 MED ORDER — OXYCODONE HCL 5 MG PO TABS
5.0000 mg | ORAL_TABLET | Freq: Once | ORAL | Status: AC
  Administered 2021-09-05: 14:00:00 5 mg via ORAL
  Filled 2021-09-05: qty 1

## 2021-09-05 MED ORDER — LIDOCAINE 5 % EX PTCH
1.0000 | MEDICATED_PATCH | CUTANEOUS | Status: DC
  Administered 2021-09-05: 13:00:00 1 via TRANSDERMAL
  Filled 2021-09-05: qty 1

## 2021-09-05 NOTE — ED Notes (Signed)
Ambulated pt with the assistance of a walker as well as family. Pt was able to take a few steps, until they felt unbalanced, pt was instructed to get back in bed. Pt states that she is still in pain.

## 2021-09-05 NOTE — ED Notes (Signed)
Too painful for patient to get out of bed or ambulate PA notified.

## 2021-09-05 NOTE — ED Notes (Signed)
Pt NAD, a/ox4. Pt and family verbalizes understanding of all DC and f/u instructions. All questions answered. Pt assisted into clothing and w/c with family to assist into vehicle outside.

## 2021-09-05 NOTE — ED Notes (Signed)
Pt sitting fowlers in bed with family and a tech at bedside. Pt begins tearing up and states she does not want to go home. Family states same, stating pt lives at home alone and is not able to ambulate well. When asked if pt has any family who is able to be with her, family states no, that they all work and cannot stay with her. MD Clarice Pole made aware of situation.

## 2021-09-05 NOTE — ED Provider Notes (Signed)
[facility] EMERGENCY DEPARTMENT Provider Note   CSN: [id number] Arrival date & time: 09/05/21  1109     History Chief Complaint  Patient presents with   Marletta Lor    Doris Pacheco is a 80 y.o. female presenting for evaluation after fall.  Patient states 2 days ago she was getting off the toilet when she lost her balance, fell, and hit her head in the bathroom.  She did not lose consciousness.  She reports acute onset pain in her low back, especially on the left side, radiating to her left leg.  She also reports headache.  She is not on blood thinners.  She has been able to ambulate with pain over the past couple days.  However today her pain was worse, and she was unable to get out of bed and ambulate, thus prompting her ER visit.  She denies vision changes, slurred speech, chest pain, shortness of breath, nausea vomit, abdominal pain.  No loss of bowel bladder control.  She has not taken anything for pain including Tylenol or ibuprofen.  Movement and palpation makes the pain worse, nothing makes it better.  HPI     Past Medical History:  Diagnosis Date   Anxiety    Arthritis    GERD (gastroesophageal reflux disease)    History of seasonal allergies    Hypertension    takes meds daily   Stroke [facility])    tia    Patient Active Problem List   Diagnosis Date Noted   Chronic pancreatitis (HCC) 09/01/2020   Epigastric pain 09/01/2020   Family history of malignant neoplasm of gastrointestinal tract 09/01/2020   Imaging of gastrointestinal tract abnormal 09/01/2020   Left lower quadrant pain 09/01/2020   Personal history of colonic polyps 09/01/2020   Slow transit constipation 09/01/2020   Weight decreased 09/01/2020   Hypothyroidism 03/02/2016   Hyperlipidemia 03/02/2016   Closed fracture of right distal femur (HCC) 02/03/2016   Abrasion, knee    Fracture of femur, distal, right, closed (HCC) 02/02/2016   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    Anemia 02/02/2016   Femoral distal fracture, right, closed, initial encounter 02/02/2016   Essential hypertension    Fall    Osteoporosis 02/18/2015   Vertebral fracture, osteoporotic (HCC) 02/18/2015   Bilateral low back pain without sciatica 02/18/2015   Midline low back pain without sciatica 01/08/2015   Chest pain 07/30/2013   Hypertension    Anxiety    History of seasonal allergies    History of CVA (cerebrovascular accident)    GERD (gastroesophageal reflux disease)    Arthritis    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    Past Surgical History:  Procedure Laterality Date   ABDOMINAL HYSTERECTOMY     I & D KNEE WITH POLY EXCHANGE Right 10/26/2012   Procedure: IRRIGATION AND DEBRIDEMENT KNEE WITH POLY EXCHANGE;  Surgeon: Nestor Lewandowsky, MD;  Location: [facility] OR;  Service: Orthopedics;  Laterality: Right;  poly exchange   INTRAOCULAR LENS INSERTION     right eye   JOINT REPLACEMENT     left hip, right knee   ORIF FEMUR FRACTURE Right 02/03/2016   Procedure: OPEN REDUCTION INTERNAL FIXATION (ORIF) DISTAL FEMUR FRACTURE;  Surgeon: Gean Birchwood, MD;  Location: [facility] OR;  Service: Orthopedics;  Laterality: Right;   PATELLECTOMY Right 10/26/2012   Procedure: PATELLECTOMY;  Surgeon: Nestor Lewandowsky, MD;  Location: [facility] OR;  Service: Orthopedics;  Laterality: Right;   ROTATOR CUFF REPAIR  bilateral   TONSILLECTOMY       OB History     Gravida  3   Para  3   Term      Preterm      AB      Living  3      SAB      IAB      Ectopic      Multiple      Live Births              Family History  Problem Relation Age of Onset   Cancer Mother        COLON   Diabetes Sister    Stroke Sister    Hypertension Father    Pneumonia Father     Social History   Tobacco Use   Smoking status: Former   Smokeless tobacco: Never  Building services engineer Use: Never used  Substance Use Topics   Alcohol use: No    Alcohol/week: 0.0 standard drinks   Drug use: No     Home Medications Prior to Admission medications   Medication Sig Start Date End Date Taking? Authorizing Provider  ammonium lactate (LAC-HYDRIN) 12 % lotion Apply 1 application topically as needed for dry skin. 09/01/20   Freddie Breech, DPM  Ascorbic Acid (VITAMIN C) 100 MG tablet     [provider]  atenolol (TENORMIN) 25 MG tablet     [provider]  atorvastatin (LIPITOR) 20 MG tablet     [provider]  calcium carbonate (OSCAL) 1500 (600 Ca) MG TABS tablet Take 1,500 mg by mouth daily with breakfast.     [provider]  cetirizine (ZYRTEC ALLERGY) 10 MG tablet     [provider]  Cholecalciferol (VITAMIN D3) 25 MCG (1000 UT) CAPS     [provider]  diclofenac Sodium (VOLTAREN) 1 % GEL Apply 300 g topically as needed. 03/02/20   [provider]  docusate sodium (COLACE) 100 MG capsule     [provider]  estrogens, conjugated, (PREMARIN) 1.25 MG tablet Take 1.25 mg by mouth daily.    [provider]  famotidine (PEPCID) 20 MG tablet Take 20 mg by mouth daily. 07/05/21   [provider]  fluconazole (DIFLUCAN) 100 MG tablet     [provider]  fluticasone (FLONASE) 50 MCG/ACT nasal spray  07/30/18   [provider]  hydrALAZINE (APRESOLINE) 10 MG tablet     [provider]  HYDROcodone-acetaminophen (NORCO/VICODIN) 5-325 MG tablet Take 1 tablet by mouth every 6 (six) hours as needed. 09/05/21  Yes Catheryne Deford, PA-C  hydrocortisone 2.5 % cream  02/23/21   [provider]  ibuprofen (ADVIL,MOTRIN) 800 MG tablet  08/09/18   [provider]  ketoconazole (NIZORAL) 2 % cream  02/23/21   [provider]  lidocaine (LIDODERM) 5 % Place 1 patch onto the skin daily. Remove & Discard patch within 12 hours or as directed by MD 09/05/21  Yes Jannet Calip, PA-C  meloxicam (MOBIC) 15 MG tablet     [provider]  Meth-Hyo-M  Bl-Na Phos-Ph Sal (URIBEL) 118 MG CAPS Take 118 mg by mouth daily.     [provider]  Methen-Hyosc-Meth Blue-Na Phos (UROGESIC-BLUE) 81.6 MG TABS  09/17/18   [provider]  methocarbamol (ROBAXIN) 500 MG tablet Take 1 tablet (500 mg total) by mouth 2 (two) times daily as needed for muscle spasms. 09/05/21  Yes Mahika Vanvoorhis,  PA-C  montelukast (SINGULAIR) 10 MG tablet Take 10 mg by mouth at bedtime.     [provider]  omeprazole (PRILOSEC) 20 MG capsule Take 20 mg by mouth 2 (two) times daily. 03/10/21   [provider]  omeprazole (PRILOSEC) 40 MG capsule  05/16/19   [provider]  potassium chloride (K-DUR,KLOR-CON) 10 MEQ tablet Take 10 mEq by mouth 2 (two) times daily.     [provider]  potassium chloride (KLOR-CON 10) 10 MEQ tablet     [provider]  prednisoLONE acetate (PRED FORTE) 1 % ophthalmic suspension INSTILL 1 DROP INTO EACH EYES 4 TIMES DAILY FOR 1 WEEK THEN 1 DROP INTO EACH EYES TWICE DAILY FOR 1 WEEK Patient not taking: Reported on 07/30/2020 02/11/20   [provider]  PREMARIN 0.625 MG tablet Take 0.625 mg by mouth daily.  05/27/20   [provider]  telmisartan (MICARDIS) 80 MG tablet     [provider]  tiZANidine (ZANAFLEX) 4 MG tablet Take 4 mg by mouth 2 (two) times daily.  01/07/20   [provider]  traMADol Janean Sark) 50 MG tablet  03/26/21   [provider]  traMADol HCl 100 MG TABS  09/16/19   [provider]  triamterene-hydrochlorothiazide (MAXZIDE-25) 37.5-25 MG tablet Take 1 tablet by mouth daily. 03/14/20   [provider]  valsartan (DIOVAN) 320 MG tablet Take 320 mg by mouth daily. Patient not taking: Reported on 07/30/2020    [provider]  vitamin E 180 MG (400 UNITS) capsule     [provider]    Allergies    Forteo [parathyroid hormone (recomb)], Other, and Tape  Review of Systems   Review of Systems   Musculoskeletal:  Positive for back pain.  Neurological:  Positive for headaches.  All other systems reviewed and are negative.  Physical Exam Updated Vital Signs BP 138/70    Pulse 74    Temp 98.6 F (37 C) (Oral)    Resp 19    Ht 5\' 4"  (1.626 m)    Wt 69.9 kg    SpO2 96%    BMI 26.43 kg/m   Physical Exam Vitals and nursing note reviewed.  Constitutional:      General: She is not in acute distress.    Appearance: Normal appearance.  HENT:     Head: Normocephalic.      Comments: Hematoma, no laceration Eyes:     Extraocular Movements: Extraocular movements intact.     Conjunctiva/sclera: Conjunctivae normal.     Pupils: Pupils are equal, round, and reactive to light.  Cardiovascular:     Rate and Rhythm: Normal rate and regular rhythm.     Pulses: Normal pulses.  Pulmonary:     Effort: Pulmonary effort is normal. No respiratory distress.     Breath sounds: Normal breath sounds. No wheezing.     Comments: Speaking in full sentences.  Clear lung sounds in all fields. Abdominal:     General: There is no distension.     Palpations: Abdomen is soft. There is no mass.     Tenderness: There is no abdominal tenderness. There is no guarding or rebound.  Musculoskeletal:        General: Tenderness present. Normal range of motion.     Cervical back: Normal range of motion and neck supple.     Comments: Tenderness palpation of the low back, especially on the left paravertebral region.  No contusions or deformities.  Patient unable  to lift up her left leg due to pain.  No saddle anesthesia.  Pedal pulses 2+.  Patellar reflex normal on the left side, unable to be tested on the right due to hardware.  Skin:    General: Skin is warm and dry.     Capillary Refill: Capillary refill takes less than 2 seconds.  Neurological:     Mental Status: She is alert and oriented to person, place, and time.  Psychiatric:        Mood and Affect: Mood and affect normal.        Speech: Speech normal.         Behavior: Behavior normal.    ED Results / Procedures / Treatments   Labs (all labs ordered are listed, but only abnormal results are displayed) Labs Reviewed  CBC WITH DIFFERENTIAL/PLATELET - Abnormal; Notable for the following components:      Result Value   RDW 16.5 (*)    Abs Immature Granulocytes 0.14 (*)    All other components within normal limits    EKG None  Radiology DG Chest 2 View  Result Date: 09/05/2021 CLINICAL DATA:  Fall EXAM: CHEST - 2 VIEW COMPARISON:  Radiograph 05/10/2021 FINDINGS: Unchanged cardiomediastinal silhouette. There is no focal airspace consolidation. There is no pleural effusion. No pneumothorax. Bilateral shoulder degenerative changes. Thoracic spondylosis. No acute osseous abnormality. Unchanged compression deformities of T7 and T11. IMPRESSION: No evidence of acute cardiopulmonary disease. Unchanged compression deformities of T7 and T11. Electronically Signed   By: Caprice Renshaw M.D.   On: 09/05/2021 13:54   DG Pelvis 1-2 Views  Result Date: 09/05/2021 CLINICAL DATA:  Fall, left-sided pain EXAM: PELVIS - 1-2 VIEW COMPARISON:  CT abdomen pelvis 05/11/2021 FINDINGS: Prior left hip arthroplasty and partially visualized right femoral shaft intramedullary nail. There is no evidence of acute fracture. There is a chronic left iliac wing fracture. Hardware is intact without evidence of loosening. IMPRESSION: Prior left hip arthroplasty and right femoral shaft intramedullary nail. No evidence of hardware complication or acute fracture on single frontal view of the pelvis. Electronically Signed   By: Caprice Renshaw M.D.   On: 09/05/2021 13:52   CT Head Wo Contrast  Result Date: 09/05/2021 CLINICAL DATA:  Larey Seat 2 days ago hitting her head and back. Pain down the left side. Numbness to the left foot. EXAM: CT HEAD WITHOUT CONTRAST CT CERVICAL SPINE WITHOUT CONTRAST TECHNIQUE: Multidetector CT imaging of the head and cervical spine was performed following the  standard protocol without intravenous contrast. Multiplanar CT image reconstructions of the cervical spine were also generated. COMPARISON:  07/27/2013. FINDINGS: CT HEAD FINDINGS Brain: No evidence of acute infarction, hemorrhage, hydrocephalus, extra-axial collection or mass lesion/mass effect. Ventricular greater than sulcal enlargement consistent with predominant central volume loss. Mild periventricular white matter hypoattenuation consistent with chronic microvascular ischemic change. Vascular: No hyperdense vessel or unexpected calcification. Skull: Normal. Negative for fracture or focal lesion. Sinuses/Orbits: Globes and orbits are unremarkable. Visualized sinuses are clear. Other: None. CT CERVICAL SPINE FINDINGS Alignment: Reversal the normal cervical lordosis, apex at C4. No spondylolisthesis. Skull base and vertebrae: No acute fracture. No primary bone lesion or focal pathologic process. Soft tissues and spinal canal: No prevertebral fluid or swelling. No visible canal hematoma. Disc levels: Marked loss of disc height throughout the cervical spine. Endplate osteophytes, sclerosis and cystic change. Very degrees of disc bulging and endplate/uncovertebral spurring. No convincing disc herniation. Facet joints are relatively well preserved. Upper chest: No acute or significant findings.  Other: None. IMPRESSION: HEAD CT 1. No acute intracranial abnormalities. CERVICAL CT 1. No fracture or acute finding. 2. Advanced disc degenerative changes. Electronically Signed   By: Amie Portland M.D.   On: 09/05/2021 12:35   CT Cervical Spine Wo Contrast  Result Date: 09/05/2021 CLINICAL DATA:  Larey Seat 2 days ago hitting her head and back. Pain down the left side. Numbness to the left foot. EXAM: CT HEAD WITHOUT CONTRAST CT CERVICAL SPINE WITHOUT CONTRAST TECHNIQUE: Multidetector CT imaging of the head and cervical spine was performed following the standard protocol without intravenous contrast. Multiplanar CT image  reconstructions of the cervical spine were also generated. COMPARISON:  07/27/2013. FINDINGS: CT HEAD FINDINGS Brain: No evidence of acute infarction, hemorrhage, hydrocephalus, extra-axial collection or mass lesion/mass effect. Ventricular greater than sulcal enlargement consistent with predominant central volume loss. Mild periventricular white matter hypoattenuation consistent with chronic microvascular ischemic change. Vascular: No hyperdense vessel or unexpected calcification. Skull: Normal. Negative for fracture or focal lesion. Sinuses/Orbits: Globes and orbits are unremarkable. Visualized sinuses are clear. Other: None. CT CERVICAL SPINE FINDINGS Alignment: Reversal the normal cervical lordosis, apex at C4. No spondylolisthesis. Skull base and vertebrae: No acute fracture. No primary bone lesion or focal pathologic process. Soft tissues and spinal canal: No prevertebral fluid or swelling. No visible canal hematoma. Disc levels: Marked loss of disc height throughout the cervical spine. Endplate osteophytes, sclerosis and cystic change. Very degrees of disc bulging and endplate/uncovertebral spurring. No convincing disc herniation. Facet joints are relatively well preserved. Upper chest: No acute or significant findings. Other: None. IMPRESSION: HEAD CT 1. No acute intracranial abnormalities. CERVICAL CT 1. No fracture or acute finding. 2. Advanced disc degenerative changes. Electronically Signed   By: Amie Portland M.D.   On: 09/05/2021 12:35   CT Thoracic Spine Wo Contrast  Result Date: 09/05/2021 CLINICAL DATA:  Fall 2 days ago, pain down left side with intermittent numbness of left foot EXAM: CT THORACIC AND LUMBAR SPINE WITHOUT CONTRAST TECHNIQUE: Multidetector CT imaging of the thoracic and lumbar spine was performed without contrast. Multiplanar CT image reconstructions were also generated. COMPARISON:  CT abdomen/pelvis 05/11/2021, lumbar spine MRI 10/04/2014, chest radiograph 05/10/2021 FINDINGS:  CT THORACIC SPINE FINDINGS Alignment: There is S shaped scoliosis of the thoracic spine with dextrocurvature centered at T3-T4 and levocurvature centered at T11. There is grade 1 retrolisthesis of T12 on L1, unchanged compared to the prior CT abdomen/pelvis. There is no evidence of traumatic malalignment. Vertebrae: There is mild compression deformity of the T7 vertebral body with up to approximately 20% loss of vertebral body height and mild compression deformity of the T11 vertebral body with approximately 10% loss of vertebral body height, unchanged since the prior chest radiograph from 05/10/2021. There is no evidence of acute fracture. There is no suspicious osseous lesion. 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 Paraspinal and other soft tissues: The paraspinal soft tissues are unremarkable. There is calcified atherosclerotic plaque throughout the thoracic aorta. Disc levels: There is multilevel disc space narrowing with vacuum disc phenomenon at T8-T9 through T12-L1. There is associated degenerative endplate change and mild facet arthropathy. There is severe bilateral neural foraminal stenosis at T12-L1. There is no other high-grade spinal canal or neural foraminal stenosis. CT LUMBAR SPINE FINDINGS Segmentation: Standard. Alignment: There is grade 1 retrolisthesis of L1 on L2 and grade 1 anterolisthesis of L3 on L4, L4 on L5, and L5 on S1, unchanged. There is exaggerated lumbar lordosis. There is no  evidence of traumatic malalignment. Vertebrae: Mild anterior wedge deformity of the L1 vertebral body is unchanged. Vertebral body heights are otherwise preserved, without evidence of acute fracture. There is no suspicious osseous abnormality. Bilateral pars defects at L3-L4 are unchanged. Paraspinal and other soft tissues: The paraspinal soft tissues are unremarkable. There is colonic diverticulosis without evidence of acute diverticulitis. Disc levels: There is multilevel  disc space narrowing with vacuum disc phenomenon at L4-L5 and L5-S1. There is relatively mild degenerative endplate change throughout the lumbar spine. There is multilevel facet arthropathy, most advanced at L4-L5 and L5-S1. Osseous spinal canal is patent. There is severe bilateral neural foraminal stenosis at L1-L2 and mild-to-moderate bilateral neural foraminal stenosis at L5-S1. IMPRESSION: CT THORACIC SPINE IMPRESSION 1. No acute fracture or traumatic malalignment of the thoracic spine. 2. Mild compression deformities of the T7 and T11 vertebral bodies, unchanged compared to a prior chest radiograph from 05/10/2021. 3. Mild S shaped scoliosis and grade 1 retrolisthesis of T12 on L1, unchanged. CT LUMBAR SPINE IMPRESSION 1. No acute fracture or traumatic malalignment of the lumbar spine. 2. Unchanged mild anterior wedge deformity of the L1 vertebral body. 3. Multilevel spondylosis and spondylolisthesis as above. Aortic Atherosclerosis (ICD10-I70.0). Electronically Signed   By: Lesia Hausen M.D.   On: 09/05/2021 12:37   CT Lumbar Spine Wo Contrast  Result Date: 09/05/2021 CLINICAL DATA:  Fall 2 days ago, pain down left side with intermittent numbness of left foot EXAM: CT THORACIC AND LUMBAR SPINE WITHOUT CONTRAST TECHNIQUE: Multidetector CT imaging of the thoracic and lumbar spine was performed without contrast. Multiplanar CT image reconstructions were also generated. COMPARISON:  CT abdomen/pelvis 05/11/2021, lumbar spine MRI 10/04/2014, chest radiograph 05/10/2021 FINDINGS: CT THORACIC SPINE FINDINGS Alignment: There is S shaped scoliosis of the thoracic spine with dextrocurvature centered at T3-T4 and levocurvature centered at T11. There is grade 1 retrolisthesis of T12 on L1, unchanged compared to the prior CT abdomen/pelvis. There is no evidence of traumatic malalignment. Vertebrae: There is mild compression deformity of the T7 vertebral body with up to approximately 20% loss of vertebral body height  and mild compression deformity of the T11 vertebral body with approximately 10% loss of vertebral body height, unchanged since the prior chest radiograph from 05/10/2021. There is no evidence of acute fracture. There is no suspicious osseous lesion. 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 Paraspinal and other soft tissues: The paraspinal soft tissues are unremarkable. There is calcified atherosclerotic plaque throughout the thoracic aorta. Disc levels: There is multilevel disc space narrowing with vacuum disc phenomenon at T8-T9 through T12-L1. There is associated degenerative endplate change and mild facet arthropathy. There is severe bilateral neural foraminal stenosis at T12-L1. There is no other high-grade spinal canal or neural foraminal stenosis. CT LUMBAR SPINE FINDINGS Segmentation: Standard. Alignment: There is grade 1 retrolisthesis of L1 on L2 and grade 1 anterolisthesis of L3 on L4, L4 on L5, and L5 on S1, unchanged. There is exaggerated lumbar lordosis. There is no evidence of traumatic malalignment. Vertebrae: Mild anterior wedge deformity of the L1 vertebral body is unchanged. Vertebral body heights are otherwise preserved, without evidence of acute fracture. There is no suspicious osseous abnormality. Bilateral pars defects at L3-L4 are unchanged. Paraspinal and other soft tissues: The paraspinal soft tissues are unremarkable. There is colonic diverticulosis without evidence of acute diverticulitis. Disc levels: There is multilevel disc space narrowing with vacuum disc phenomenon at L4-L5 and L5-S1. There is relatively mild degenerative endplate change  throughout the lumbar spine. There is multilevel facet arthropathy, most advanced at L4-L5 and L5-S1. Osseous spinal canal is patent. There is severe bilateral neural foraminal stenosis at L1-L2 and mild-to-moderate bilateral neural foraminal stenosis at L5-S1. IMPRESSION: CT THORACIC SPINE IMPRESSION 1. No acute  fracture or traumatic malalignment of the thoracic spine. 2. Mild compression deformities of the T7 and T11 vertebral bodies, unchanged compared to a prior chest radiograph from 05/10/2021. 3. Mild S shaped scoliosis and grade 1 retrolisthesis of T12 on L1, unchanged. CT LUMBAR SPINE IMPRESSION 1. No acute fracture or traumatic malalignment of the lumbar spine. 2. Unchanged mild anterior wedge deformity of the L1 vertebral body. 3. Multilevel spondylosis and spondylolisthesis as above. Aortic Atherosclerosis (ICD10-I70.0). Electronically Signed   By: Lesia Hausen M.D.   On: 09/05/2021 12:37    Procedures Procedures   Medications Ordered in ED Medications  lidocaine (LIDODERM) 5 % 1 patch (1 patch Transdermal Patch Applied 09/05/21 1324)  acetaminophen (TYLENOL) tablet 650 mg (650 mg Oral Given 09/05/21 1202)  methocarbamol (ROBAXIN) tablet 500 mg (500 mg Oral Given 09/05/21 1202)  oxyCODONE (Oxy IR/ROXICODONE) immediate release tablet 5 mg (5 mg Oral Given 09/05/21 1421)    ED Course  I have reviewed the triage vital signs and the nursing notes.  Pertinent labs & imaging results that were available during my care of the patient were reviewed by me and considered in my medical decision making (see chart for details).    MDM Rules/Calculators/A&P                          Patient resenting for evaluation of back pain after a fall 2 days ago.  On exam, patient peers nontoxic.  She is neurovascular intact.  Pain is reproducible with palpation the musculature.  As it was several days ago, likely muscular cause.  However considering patient's age and history of osteoporosis, will obtain CT imaging to rule out fracture dislocation.  X-ray of the chest and pelvis to rule out injury.  CT of the head, neck, T-spine, and L-spine does not show any acute injury.  Shows stable compression fractures.  Chest and pelvis x-rays viewed and independently interpreted by me, no fracture or dislocation.  Discussed  findings with patient and family.  Discussed symptomatic management, will make sure patient is able to ambulate at least a few steps prior to discharge.  After pain control, patient was able to ambulate with walker and assistance.  Discussed that she will likely have continued pain over the next few days, follow-up with PCP as needed.  At this time, patient appears safe for discharge.  Return precautions given.  Patient states she understands and agrees to plan      Final Clinical Impression(s) / ED Diagnoses Final diagnoses:  Acute midline low back pain without sciatica  Fall, initial encounter    Rx / DC Orders ED Discharge Orders          Ordered    HYDROcodone-acetaminophen (NORCO/VICODIN) 5-325 MG tablet  Every 6 hours PRN        09/05/21 1523    methocarbamol (ROBAXIN) 500 MG tablet  2 times daily PRN        09/05/21 1523    lidocaine (LIDODERM) 5 %  Every 24 hours        09/05/21 1523             Gianny Sabino, PA-C 09/05/21 1525    Long, Arlyss Repress,  MD 09/08/21 [phone]

## 2021-09-05 NOTE — ED Notes (Signed)
MD Clarice Pole at bedside to speak with pt and family

## 2021-09-05 NOTE — ED Triage Notes (Signed)
Patient fell two days ago hitting head and back.  Patient having pain down left side with intermittent numbness to left foot.

## 2021-09-05 NOTE — Discharge Instructions (Addendum)
Take ibuprofen 3 times a day with meals.  Do not take other anti-inflammatories at the same time (Advil, Motrin, naproxen, Aleve). You may supplement with Tylenol if you need further pain control. Use ice packs or heating pads if this helps control your pain. Use Norco as needed for severe breakthrough pain.  Have caution, this may make you tired or groggy.  It may increase your risk of falls.  Make sure there is somebody around if you are taking this medicine Use Robaxin as needed for muscle stiffness or soreness.  Use the numbing patch to help with pain control.  I recommend you try and move and stretch as muscle as your body allows.  There are some stretches you can do in the paperwork. Follow up with your primary care doctor for recheck of symptoms Return to the ER if you develop high fevers, numbness, loss of bowel or bladder control, or any new or concerning symptoms.

## 2021-09-16 ENCOUNTER — Other Ambulatory Visit: Payer: Self-pay

## 2021-09-16 ENCOUNTER — Encounter (HOSPITAL_COMMUNITY): Payer: Self-pay | Admitting: Emergency Medicine

## 2021-09-16 ENCOUNTER — Emergency Department (HOSPITAL_COMMUNITY): Payer: Medicare Other

## 2021-09-16 ENCOUNTER — Emergency Department (HOSPITAL_COMMUNITY)
Admission: EM | Admit: 2021-09-16 | Discharge: 2021-09-16 | Disposition: A | Discharge status: Home / Self Care | Payer: Medicare Other | Attending: Emergency Medicine | Admitting: Emergency Medicine

## 2021-09-16 DIAGNOSIS — Z96651 Presence of right artificial knee joint: Secondary | ICD-10-CM | POA: Insufficient documentation

## 2021-09-16 DIAGNOSIS — Y92009 Unspecified place in unspecified non-institutional (private) residence as the place of occurrence of the external cause: Secondary | ICD-10-CM | POA: Insufficient documentation

## 2021-09-16 DIAGNOSIS — U071 COVID-19: Secondary | ICD-10-CM | POA: Insufficient documentation

## 2021-09-16 DIAGNOSIS — Z87891 Personal history of nicotine dependence: Secondary | ICD-10-CM | POA: Diagnosis not present

## 2021-09-16 DIAGNOSIS — W010XXA Fall on same level from slipping, tripping and stumbling without subsequent striking against object, initial encounter: Secondary | ICD-10-CM | POA: Diagnosis not present

## 2021-09-16 DIAGNOSIS — R051 Acute cough: Secondary | ICD-10-CM

## 2021-09-16 DIAGNOSIS — Z96642 Presence of left artificial hip joint: Secondary | ICD-10-CM | POA: Diagnosis not present

## 2021-09-16 DIAGNOSIS — Z79899 Other long term (current) drug therapy: Secondary | ICD-10-CM | POA: Diagnosis not present

## 2021-09-16 DIAGNOSIS — I10 Essential (primary) hypertension: Secondary | ICD-10-CM | POA: Diagnosis not present

## 2021-09-16 DIAGNOSIS — E039 Hypothyroidism, unspecified: Secondary | ICD-10-CM | POA: Diagnosis not present

## 2021-09-16 DIAGNOSIS — R531 Weakness: Secondary | ICD-10-CM | POA: Insufficient documentation

## 2021-09-16 DIAGNOSIS — W19XXXA Unspecified fall, initial encounter: Secondary | ICD-10-CM

## 2021-09-16 DIAGNOSIS — R059 Cough, unspecified: Secondary | ICD-10-CM | POA: Diagnosis present

## 2021-09-16 LAB — CBC WITH DIFFERENTIAL/PLATELET
Abs Immature Granulocytes: 0.3 10*3/uL — ABNORMAL HIGH (ref 0.00–0.07)
Basophils Absolute: 0.1 10*3/uL (ref 0.0–0.1)
Basophils Relative: 1 %
Eosinophils Absolute: 0.1 10*3/uL (ref 0.0–0.5)
Eosinophils Relative: 1 %
HCT: 41.8 % (ref 36.0–46.0)
Hemoglobin: 13.4 g/dL (ref 12.0–15.0)
Immature Granulocytes: 4 %
Lymphocytes Relative: 10 %
Lymphs Abs: 0.7 10*3/uL (ref 0.7–4.0)
MCH: 28.5 pg (ref 26.0–34.0)
MCHC: 32.1 g/dL (ref 30.0–36.0)
MCV: 88.7 fL (ref 80.0–100.0)
Monocytes Absolute: 0.6 10*3/uL (ref 0.1–1.0)
Monocytes Relative: 9 %
Neutro Abs: 5.4 10*3/uL (ref 1.7–7.7)
Neutrophils Relative %: 75 %
Platelets: 217 10*3/uL (ref 150–400)
RBC: 4.71 MIL/uL (ref 3.87–5.11)
RDW: 15.9 % — ABNORMAL HIGH (ref 11.5–15.5)
WBC: 7.2 10*3/uL (ref 4.0–10.5)
nRBC: 0 % (ref 0.0–0.2)

## 2021-09-16 LAB — COMPREHENSIVE METABOLIC PANEL
ALT: 13 U/L (ref 0–44)
AST: 17 U/L (ref 15–41)
Albumin: 3.7 g/dL (ref 3.5–5.0)
Alkaline Phosphatase: 43 U/L (ref 38–126)
Anion gap: 10 (ref 5–15)
BUN: 43 mg/dL — ABNORMAL HIGH (ref 8–23)
CO2: 18 mmol/L — ABNORMAL LOW (ref 22–32)
Calcium: 9.5 mg/dL (ref 8.9–10.3)
Chloride: 108 mmol/L (ref 98–111)
Creatinine, Ser: 1.29 mg/dL — ABNORMAL HIGH (ref 0.44–1.00)
GFR, Estimated: 42 mL/min — ABNORMAL LOW (ref >60)
Glucose, Bld: 136 mg/dL — ABNORMAL HIGH (ref 70–99)
Potassium: 4.2 mmol/L (ref 3.5–5.1)
Sodium: 136 mmol/L (ref 135–145)
Total Bilirubin: 0.8 mg/dL (ref 0.3–1.2)
Total Protein: 6.9 g/dL (ref 6.5–8.1)

## 2021-09-16 LAB — TROPONIN I (HIGH SENSITIVITY)
Troponin I (High Sensitivity): 10 ng/L (ref <18)
Troponin I (High Sensitivity): 11 ng/L (ref <18)

## 2021-09-16 LAB — RESP PANEL BY RT-PCR (FLU A&B, COVID) ARPGX2
Influenza A by PCR: NEGATIVE
Influenza B by PCR: NEGATIVE
SARS Coronavirus 2 by RT PCR: POSITIVE — AB

## 2021-09-16 LAB — CBG MONITORING, ED: Glucose-Capillary: 140 mg/dL — ABNORMAL HIGH (ref 70–99)

## 2021-09-16 MED ORDER — ACETAMINOPHEN 325 MG PO TABS: 650.0000 mg | ORAL_TABLET | Freq: Four times a day (QID) | ORAL | 0 refills | Status: AC | PRN

## 2021-09-16 MED ORDER — IBUPROFEN 400 MG PO TABS
600.0000 mg | ORAL_TABLET | Freq: Once | ORAL | Status: AC
  Administered 2021-09-16: 15:00:00 600 mg via ORAL
  Filled 2021-09-16: qty 1

## 2021-09-16 MED ORDER — ALBUTEROL SULFATE HFA 108 (90 BASE) MCG/ACT IN AERS: 1.0000 | INHALATION_SPRAY | Freq: Four times a day (QID) | RESPIRATORY_TRACT | 0 refills | Status: AC | PRN

## 2021-09-16 MED ORDER — GUAIFENESIN 100 MG/5ML PO LIQD: 100.0000 mg | ORAL | 0 refills | Status: AC | PRN

## 2021-09-16 MED ORDER — ACETAMINOPHEN 325 MG PO TABS
650.0000 mg | ORAL_TABLET | Freq: Once | ORAL | Status: AC
  Administered 2021-09-16: 15:00:00 650 mg via ORAL
  Filled 2021-09-16: qty 2

## 2021-09-16 MED ORDER — GUAIFENESIN 100 MG/5ML PO LIQD
5.0000 mL | Freq: Once | ORAL | Status: AC
  Administered 2021-09-16: 15:00:00 5 mL via ORAL
  Filled 2021-09-16: qty 5

## 2021-09-16 MED ORDER — NIRMATRELVIR/RITONAVIR (PAXLOVID) TABLET (RENAL DOSING)
2.0000 | ORAL_TABLET | Freq: Two times a day (BID) | ORAL | 0 refills | Status: AC
Start: 2021-09-16 — End: 2021-09-21

## 2021-09-16 NOTE — Progress Notes (Signed)
CSW spoke with family who are interested in skilled nursing rehab for patient due to recent falls at home. CSW explained that the provider would determine the level of care and if its needed a consult to physical therapy would be put in place. CSW explained that PT will make recommendations on appropriate level of care. CSW notified family that due to patient being covid positive that CSW would not be able to place patient.

## 2021-09-16 NOTE — ED Provider Notes (Signed)
[facility] EMERGENCY DEPARTMENT Provider Note   CSN: [id number] Arrival date & time: 09/16/21  0119     History Chief Complaint  Patient presents with   Fall / Gen. Weakness    Doris Pacheco is a 80 y.o. female.  Patient is a 80 yo female presenting from home with family for fall and generalized weakness. Pt admits to increasing generalized weakness and cough x several days. States she fell last night while attempting to ambulate due to tripping on rug and difficulty ambulating. Admits to head trauma. Denies LOC. Denies blood thinner use. Pt states she has been seen multiple times for ambulatory difficulty and falls. Family expresses concerns for fall risk and requests assisted living care resources. Pt denies any chest pain or sob at this time. Denies rectal bleeding. Denies neurological deficits.   The history is provided by the patient. No language interpreter was used.      Past Medical History:  Diagnosis Date   Anxiety    Arthritis    GERD (gastroesophageal reflux disease)    History of seasonal allergies    Hypertension    takes meds daily   Stroke [facility])    tia    Patient Active Problem List   Diagnosis Date Noted   Chronic pancreatitis (HCC) 09/01/2020   Epigastric pain 09/01/2020   Family history of malignant neoplasm of gastrointestinal tract 09/01/2020   Imaging of gastrointestinal tract abnormal 09/01/2020   Left lower quadrant pain 09/01/2020   Personal history of colonic polyps 09/01/2020   Slow transit constipation 09/01/2020   Weight decreased 09/01/2020   Hypothyroidism 03/02/2016   Hyperlipidemia 03/02/2016   Closed fracture of right distal femur (HCC) 02/03/2016   Abrasion, knee    Fracture of femur, distal, right, closed (HCC) 02/02/2016   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   Anemia 02/02/2016   Femoral distal fracture, right, closed, initial encounter 02/02/2016   Essential hypertension    Fall     Osteoporosis 02/18/2015   Vertebral fracture, osteoporotic (HCC) 02/18/2015   Bilateral low back pain without sciatica 02/18/2015   Midline low back pain without sciatica 01/08/2015   Chest pain 07/30/2013   Hypertension    Anxiety    History of seasonal allergies    History of CVA (cerebrovascular accident)    GERD (gastroesophageal reflux disease)    Arthritis    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    Past Surgical History:  Procedure Laterality Date   ABDOMINAL HYSTERECTOMY     I & D KNEE WITH POLY EXCHANGE Right 10/26/2012   Procedure: IRRIGATION AND DEBRIDEMENT KNEE WITH POLY EXCHANGE;  Surgeon: Nestor Lewandowsky, MD;  Location: [facility] OR;  Service: Orthopedics;  Laterality: Right;  poly exchange   INTRAOCULAR LENS INSERTION     right eye   JOINT REPLACEMENT     left hip, right knee   ORIF FEMUR FRACTURE Right 02/03/2016   Procedure: OPEN REDUCTION INTERNAL FIXATION (ORIF) DISTAL FEMUR FRACTURE;  Surgeon: Gean Birchwood, MD;  Location: [facility] OR;  Service: Orthopedics;  Laterality: Right;   PATELLECTOMY Right 10/26/2012   Procedure: PATELLECTOMY;  Surgeon: Nestor Lewandowsky, MD;  Location: [facility] OR;  Service: Orthopedics;  Laterality: Right;   ROTATOR CUFF REPAIR     bilateral   TONSILLECTOMY       OB History     Gravida  3   Para  3   Term      Preterm  AB      Living  3      SAB      IAB      Ectopic      Multiple      Live Births              Family History  Problem Relation Age of Onset   Cancer Mother        COLON   Diabetes Sister    Stroke Sister    Hypertension Father    Pneumonia Father     Social History   Tobacco Use   Smoking status: Former   Smokeless tobacco: Never  Building services engineer Use: Never used  Substance Use Topics   Alcohol use: No    Alcohol/week: 0.0 standard drinks   Drug use: No    Home Medications Prior to Admission medications   Medication Sig Start Date End Date Taking? Authorizing Provider  ammonium  lactate (LAC-HYDRIN) 12 % lotion Apply 1 application topically as needed for dry skin. 09/01/20   Freddie Breech, DPM  Ascorbic Acid (VITAMIN C) 100 MG tablet     [provider]  atenolol (TENORMIN) 25 MG tablet     [provider]  atorvastatin (LIPITOR) 20 MG tablet     [provider]  calcium carbonate (OSCAL) 1500 (600 Ca) MG TABS tablet Take 1,500 mg by mouth daily with breakfast.     [provider]  cetirizine (ZYRTEC ALLERGY) 10 MG tablet     [provider]  Cholecalciferol (VITAMIN D3) 25 MCG (1000 UT) CAPS     [provider]  diclofenac Sodium (VOLTAREN) 1 % GEL Apply 300 g topically as needed. 03/02/20   [provider]  docusate sodium (COLACE) 100 MG capsule     [provider]  estrogens, conjugated, (PREMARIN) 1.25 MG tablet Take 1.25 mg by mouth daily.    [provider]  famotidine (PEPCID) 20 MG tablet Take 20 mg by mouth daily. 07/05/21   [provider]  fluconazole (DIFLUCAN) 100 MG tablet     [provider]  fluticasone (FLONASE) 50 MCG/ACT nasal spray  07/30/18   [provider]  hydrALAZINE (APRESOLINE) 10 MG tablet     [provider]  HYDROcodone-acetaminophen (NORCO/VICODIN) 5-325 MG tablet Take 1 tablet by mouth every 6 (six) hours as needed. 09/05/21   Caccavale, Sophia, PA-C  hydrocortisone 2.5 % cream  02/23/21   [provider]  ibuprofen (ADVIL,MOTRIN) 800 MG tablet  08/09/18   [provider]  ketoconazole (NIZORAL) 2 % cream  02/23/21   [provider]  lidocaine (LIDODERM) 5 % Place 1 patch onto the skin daily. Remove & Discard patch within 12 hours or as directed by MD 09/05/21   Caccavale, Sophia, PA-C  meloxicam (MOBIC) 15 MG tablet     [provider]  Meth-Hyo-M Bl-Na Phos-Ph Sal (URIBEL) 118 MG CAPS Take 118 mg by mouth daily.     [provider]  Methen-Hyosc-Meth Blue-Na Phos  (UROGESIC-BLUE) 81.6 MG TABS  09/17/18   [provider]  methocarbamol (ROBAXIN) 500 MG tablet Take 1 tablet (500 mg total) by mouth 2 (two) times daily as needed for muscle spasms. 09/05/21   Caccavale, Sophia, PA-C  montelukast (SINGULAIR) 10 MG tablet Take 10 mg by mouth at bedtime.     [provider]  omeprazole (PRILOSEC) 20 MG capsule Take 20 mg by mouth 2 (two) times daily. 03/10/21  [provider]  omeprazole (PRILOSEC) 40 MG capsule  05/16/19   [provider]  potassium chloride (K-DUR,KLOR-CON) 10 MEQ tablet Take 10 mEq by mouth 2 (two) times daily.     [provider]  potassium chloride (KLOR-CON 10) 10 MEQ tablet     [provider]  prednisoLONE acetate (PRED FORTE) 1 % ophthalmic suspension INSTILL 1 DROP INTO EACH EYES 4 TIMES DAILY FOR 1 WEEK THEN 1 DROP INTO EACH EYES TWICE DAILY FOR 1 WEEK Patient not taking: Reported on 07/30/2020 02/11/20   [provider]  PREMARIN 0.625 MG tablet Take 0.625 mg by mouth daily.  05/27/20   [provider]  telmisartan (MICARDIS) 80 MG tablet     [provider]  tiZANidine (ZANAFLEX) 4 MG tablet Take 4 mg by mouth 2 (two) times daily.  01/07/20   [provider]  traMADol Veatrice Bourbon) 50 MG tablet  03/26/21   [provider]  traMADol HCl 100 MG TABS  09/16/19   [provider]  triamterene-hydrochlorothiazide (MAXZIDE-25) 37.5-25 MG tablet Take 1 tablet by mouth daily. 03/14/20   [provider]  valsartan (DIOVAN) 320 MG tablet Take 320 mg by mouth daily. Patient not taking: Reported on 07/30/2020    [provider]  vitamin E 180 MG (400 UNITS) capsule     [provider]    Allergies    Forteo [parathyroid hormone (recomb)], Other, and Tape  Review of Systems   Review of Systems  Constitutional:  Negative for chills and fever.  HENT:  Negative for ear pain and sore throat.   Eyes:  Negative for pain and  visual disturbance.  Respiratory:  Negative for cough and shortness of breath.   Cardiovascular:  Negative for chest pain and palpitations.  Gastrointestinal:  Negative for abdominal pain and vomiting.  Genitourinary:  Negative for dysuria and hematuria.  Musculoskeletal:  Negative for arthralgias and back pain.  Skin:  Negative for color change and rash.  Neurological:  Positive for weakness (generalized). Negative for seizures and syncope.  All other systems reviewed and are negative.  Physical Exam Updated Vital Signs BP (!) 135/110    Pulse 80    Temp 98.7 F (37.1 C) (Oral)    Resp 18    SpO2 100%   Physical Exam Vitals and nursing note reviewed.  Constitutional:      General: She is not in acute distress.    Appearance: She is well-developed.  HENT:     Head: Normocephalic and atraumatic.  Eyes:     Conjunctiva/sclera: Conjunctivae normal.  Cardiovascular:     Rate and Rhythm: Normal rate and regular rhythm.     Heart sounds: No murmur heard. Pulmonary:     Effort: Pulmonary effort is normal. No respiratory distress.     Breath sounds: Normal breath sounds.  Abdominal:     Palpations: Abdomen is soft.     Tenderness: There is no abdominal tenderness.  Musculoskeletal:        General: No swelling.     Cervical back: Neck supple.  Skin:    General: Skin is warm and dry.     Capillary Refill: Capillary refill takes less than 2 seconds.  Neurological:     Mental Status: She is alert.     GCS: GCS eye subscore is 4. GCS verbal subscore is 5. GCS motor subscore is 6.     Cranial Nerves: Cranial nerves 2-12 are intact.     Sensory: Sensation  is intact.     Motor: Motor function is intact.     Coordination: Coordination is intact.  Psychiatric:        Mood and Affect: Mood normal.    ED Results / Procedures / Treatments   Labs (all labs ordered are listed, but only abnormal results are displayed) Labs Reviewed  RESP PANEL BY RT-PCR (FLU A&B, COVID) ARPGX2 -  Abnormal; Notable for the following components:      Result Value   SARS Coronavirus 2 by RT PCR POSITIVE (*)    All other components within normal limits  CBC WITH DIFFERENTIAL/PLATELET - Abnormal; Notable for the following components:   RDW 15.9 (*)    Abs Immature Granulocytes 0.30 (*)    All other components within normal limits  COMPREHENSIVE METABOLIC PANEL - Abnormal; Notable for the following components:   CO2 18 (*)    Glucose, Bld 136 (*)    BUN 43 (*)    Creatinine, Ser 1.29 (*)    GFR, Estimated 42 (*)    All other components within normal limits  CBG MONITORING, ED - Abnormal; Notable for the following components:   Glucose-Capillary 140 (*)    All other components within normal limits  URINALYSIS, ROUTINE W REFLEX MICROSCOPIC  TROPONIN I (HIGH SENSITIVITY)  TROPONIN I (HIGH SENSITIVITY)    EKG None  Radiology CT HEAD WO CONTRAST (5MM)  Result Date: 09/16/2021 CLINICAL DATA:  Fall EXAM: CT HEAD WITHOUT CONTRAST TECHNIQUE: Contiguous axial images were obtained from the base of the skull through the vertex without intravenous contrast. COMPARISON:  09/05/2021 FINDINGS: Brain: There is no mass, hemorrhage or extra-axial collection. There is generalized atrophy without lobar predilection. Hypodensity of the white matter is most commonly associated with chronic microvascular disease. Vascular: Atherosclerotic calcification of the internal carotid arteries at the skull base. No abnormal hyperdensity of the major intracranial arteries or dural venous sinuses. Skull: The visualized skull base, calvarium and extracranial soft tissues are normal. Sinuses/Orbits: No fluid levels or advanced mucosal thickening of the visualized paranasal sinuses. No mastoid or middle ear effusion. The orbits are normal. IMPRESSION: Chronic microvascular ischemia and generalized atrophy without acute intracranial abnormality. Electronically Signed   By: Ulyses Jarred M.D.   On: 09/16/2021 02:12     Procedures Procedures   Medications Ordered in ED Medications  acetaminophen (TYLENOL) tablet 650 mg (has no administration in time range)  ibuprofen (ADVIL) tablet 600 mg (has no administration in time range)  guaiFENesin (ROBITUSSIN) 100 MG/5ML liquid 5 mL (has no administration in time range)    ED Course  I have reviewed the triage vital signs and the nursing notes.  Pertinent labs & imaging results that were available during my care of the patient were reviewed by me and considered in my medical decision making (see chart for details).    MDM Rules/Calculators/A&P                         2:10 PM 80 yo female presenting from home with family for fall and generalized weakness. Patient Aox3, no acute distress, afebrile, with stable vitals.   Fall with head trauma Physical exam demonstrates no neurological deficits or evidence of trauma/wounds. CT head demonstrates no acute process. Pt denies any other bony pain at this time.   Generalized weakness Stable ECG with no ST segment elevation or depression. Troponin wnl. Stable CXR. No hx of CP or SOB Covid positive NO s/s sepsis No hypoxia No  hx of bleeding. Stable Hgb  Prescriptions given: Robatussin,Motrin/tylenol for fevers/chills/myalgias, paxlovid.   Fall Risk Family seen by social care worker. Resources for assisted living centers given. Pt unable to go directly at this time due to covid dx but family states they will stay with her at her home and keep an eye on her. Fall precautions given.   Patient in no distress and overall condition improved here in the ED. Detailed discussions were had with the patient regarding current findings, and need for close f/u with PCP or on call doctor. The patient has been instructed to return immediately if the symptoms worsen in any way for re-evaluation. Patient verbalized understanding and is in agreement with current care plan. All questions answered prior to  discharge.        Final Clinical Impression(s) / ED Diagnoses Final diagnoses:  COVID  Acute cough  Fall, initial encounter  Generalized weakness    Rx / DC Orders ED Discharge Orders     None        Lianne Cure, DO [id number]

## 2021-09-16 NOTE — ED Triage Notes (Signed)
Patient arrived with EMS from home lost her balance and fell yesterday , no LOC/denies injury , patient added generalized weakness / fatigue this evening .

## 2021-09-16 NOTE — ED Provider Notes (Signed)
Emergency Medicine Provider Triage Evaluation Note  Doris Pacheco , a 80 y.o. female  was evaluated in triage.  Pt complains of generalized weakness.  Reports having had a fall this morning.  EMS reports that she had defecated on herself.  She states that she has been seen for similar.  She denies any recent illnesses.  Review of Systems  Positive: Generalized weakness, fall Negative: Fever, chills  Physical Exam  There were no vitals taken for this visit. Gen:   Awake, no distress   Resp:  Normal effort  MSK:   Moves extremities without difficulty  Other:    Medical Decision Making  Medically screening exam initiated at 1:22 AM.  Appropriate orders placed.  Doris Pacheco was informed that the remainder of the evaluation will be completed by another provider, this initial triage assessment does not replace that evaluation, and the importance of remaining in the ED until their evaluation is complete.  Generalized weakness   Roxy Horseman, PA-C 09/16/21 0123    Doris Plan, MD 09/16/21 [id number]

## 2021-09-16 NOTE — Progress Notes (Signed)
CSW provided family with care home services and information on how to get patient into a SNF or assisted living once patient is covid negative.

## 2021-10-18 ENCOUNTER — Ambulatory Visit (INDEPENDENT_AMBULATORY_CARE_PROVIDER_SITE_OTHER): Payer: TRICARE For Life (TFL) | Admitting: Podiatry

## 2021-10-18 DIAGNOSIS — Z91199 Patient's noncompliance with other medical treatment and regimen due to unspecified reason: Secondary | ICD-10-CM

## 2021-10-18 NOTE — Progress Notes (Signed)
   Complete physical exam  Patient: Doris Pacheco   DOB: 07/09/1998   81 y.o. Female  MRN: [id number]  Subjective:    No chief complaint on file.   Doris Pacheco is a 81 y.o. female who presents today for a complete physical exam. She reports consuming a {diet types:17450} diet. {types:19826} She generally feels {DESC; WELL/FAIRLY WELL/POORLY:18703}. She reports sleeping {DESC; WELL/FAIRLY WELL/POORLY:18703}. She {does/does not:200015} have additional problems to discuss today.    Most recent fall risk assessment:    03/16/2022   10:42 AM  Fall Risk   Falls in the past year? 0  Number falls in past yr: 0  Injury with Fall? 0  Risk for fall due to : No Fall Risks  Follow up Falls evaluation completed     Most recent depression screenings:    03/16/2022   10:42 AM 02/04/2021   10:46 AM  PHQ 2/9 Scores  PHQ - 2 Score 0 0  PHQ- 9 Score 5     {VISON DENTAL STD PSA (Optional):27386}  {History (Optional):23778}  Patient Care Team: Jessup, Joy, NP as PCP - General (Nurse Practitioner)   Outpatient Medications Prior to Visit  Medication Sig   fluticasone (FLONASE) 50 MCG/ACT nasal spray Place 2 sprays into both nostrils in the morning and at bedtime. After 7 days, reduce to once daily.   norgestimate-ethinyl estradiol (SPRINTEC 28) 0.25-35 MG-MCG tablet Take 1 tablet by mouth daily.   Nystatin POWD Apply liberally to affected area 2 times per day   spironolactone (ALDACTONE) 100 MG tablet Take 1 tablet (100 mg total) by mouth daily.   No facility-administered medications prior to visit.    ROS        Objective:     There were no vitals taken for this visit. {Vitals History (Optional):23777}  Physical Exam   No results found for any visits on 04/21/22. {Show previous labs (optional):23779}    Assessment & Plan:    Routine Health Maintenance and Physical Exam  Immunization History  Administered Date(s) Administered   DTaP 09/22/1999, 11/18/1999,  01/27/2000, 10/12/2000, 04/27/2004   Hepatitis A 02/22/2008, 02/27/2009   Hepatitis B 07/10/1999, 08/17/1999, 01/27/2000   HiB (PRP-OMP) 09/22/1999, 11/18/1999, 01/27/2000, 10/12/2000   IPV 09/22/1999, 11/18/1999, 07/17/2000, 04/27/2004   Influenza,inj,Quad PF,6+ Mos 05/30/2014   Influenza-Unspecified 08/29/2012   MMR 07/17/2001, 04/27/2004   Meningococcal Polysaccharide 02/27/2012   Pneumococcal Conjugate-13 10/12/2000   Pneumococcal-Unspecified 01/27/2000, 04/11/2000   Tdap 02/27/2012   Varicella 07/17/2000, 02/22/2008    Health Maintenance  Topic Date Due   HIV Screening  Never done   Hepatitis C Screening  Never done   INFLUENZA VACCINE  04/19/2022   PAP-Cervical Cytology Screening  04/21/2022 (Originally 07/08/2020)   PAP SMEAR-Modifier  04/21/2022 (Originally 07/08/2020)   TETANUS/TDAP  04/21/2022 (Originally 02/26/2022)   HPV VACCINES  Discontinued   COVID-19 Vaccine  Discontinued    Discussed health benefits of physical activity, and encouraged her to engage in regular exercise appropriate for her age and condition.  Problem List Items Addressed This Visit   None Visit Diagnoses     Annual physical exam    -  Primary   Cervical cancer screening       Need for Tdap vaccination          No follow-ups on file.     Joy Jessup, NP   

## 2023-06-08 ENCOUNTER — Other Ambulatory Visit: Payer: Self-pay

## 2023-06-08 ENCOUNTER — Emergency Department (HOSPITAL_COMMUNITY)
Admission: EM | Admit: 2023-06-08 | Discharge: 2023-06-09 | Disposition: A | Discharge status: Home / Self Care | Payer: TRICARE For Life (TFL) | Attending: Emergency Medicine | Admitting: Emergency Medicine

## 2023-06-08 ENCOUNTER — Emergency Department (HOSPITAL_COMMUNITY): Payer: Medicare PPO

## 2023-06-08 DIAGNOSIS — R4781 Slurred speech: Secondary | ICD-10-CM | POA: Diagnosis not present

## 2023-06-08 DIAGNOSIS — R55 Syncope and collapse: Secondary | ICD-10-CM | POA: Insufficient documentation

## 2023-06-08 DIAGNOSIS — Z8673 Personal history of transient ischemic attack (TIA), and cerebral infarction without residual deficits: Secondary | ICD-10-CM | POA: Diagnosis not present

## 2023-06-08 DIAGNOSIS — Z79899 Other long term (current) drug therapy: Secondary | ICD-10-CM | POA: Diagnosis not present

## 2023-06-08 DIAGNOSIS — I10 Essential (primary) hypertension: Secondary | ICD-10-CM | POA: Insufficient documentation

## 2023-06-08 DIAGNOSIS — R2990 Unspecified symptoms and signs involving the nervous system: Secondary | ICD-10-CM | POA: Diagnosis not present

## 2023-06-08 DIAGNOSIS — Z91040 Latex allergy status: Secondary | ICD-10-CM | POA: Diagnosis not present

## 2023-06-08 LAB — CBC
HCT: 40.1 % (ref 36.0–46.0)
Hemoglobin: 12.4 g/dL (ref 12.0–15.0)
MCH: 27.8 pg (ref 26.0–34.0)
MCHC: 30.9 g/dL (ref 30.0–36.0)
MCV: 89.9 fL (ref 80.0–100.0)
Platelets: 259 10*3/uL (ref 150–400)
RBC: 4.46 MIL/uL (ref 3.87–5.11)
RDW: 16.6 % — ABNORMAL HIGH (ref 11.5–15.5)
WBC: 13.5 10*3/uL — ABNORMAL HIGH (ref 4.0–10.5)
nRBC: 0 % (ref 0.0–0.2)

## 2023-06-08 LAB — I-STAT CHEM 8, ED
BUN: 29 mg/dL — ABNORMAL HIGH (ref 8–23)
Calcium, Ion: 1.14 mmol/L — ABNORMAL LOW (ref 1.15–1.40)
Chloride: 115 mmol/L — ABNORMAL HIGH (ref 98–111)
Creatinine, Ser: 0.6 mg/dL (ref 0.44–1.00)
Glucose, Bld: 129 mg/dL — ABNORMAL HIGH (ref 70–99)
HCT: 39 % (ref 36.0–46.0)
Hemoglobin: 13.3 g/dL (ref 12.0–15.0)
Potassium: 3.7 mmol/L (ref 3.5–5.1)
Sodium: 142 mmol/L (ref 135–145)
TCO2: 18 mmol/L — ABNORMAL LOW (ref 22–32)

## 2023-06-08 LAB — COMPREHENSIVE METABOLIC PANEL
ALT: 8 U/L (ref 0–44)
AST: 17 U/L (ref 15–41)
Albumin: 3.3 g/dL — ABNORMAL LOW (ref 3.5–5.0)
Alkaline Phosphatase: 40 U/L (ref 38–126)
Anion gap: 18 — ABNORMAL HIGH (ref 5–15)
BUN: 21 mg/dL (ref 8–23)
CO2: 15 mmol/L — ABNORMAL LOW (ref 22–32)
Calcium: 9.9 mg/dL (ref 8.9–10.3)
Chloride: 109 mmol/L (ref 98–111)
Creatinine, Ser: 0.75 mg/dL (ref 0.44–1.00)
GFR, Estimated: >60 mL/min (ref >60)
Glucose, Bld: 130 mg/dL — ABNORMAL HIGH (ref 70–99)
Potassium: 3.5 mmol/L (ref 3.5–5.1)
Sodium: 142 mmol/L (ref 135–145)
Total Bilirubin: 0.6 mg/dL (ref 0.3–1.2)
Total Protein: 6.5 g/dL (ref 6.5–8.1)

## 2023-06-08 LAB — DIFFERENTIAL
Abs Immature Granulocytes: 0.13 10*3/uL — ABNORMAL HIGH (ref 0.00–0.07)
Basophils Absolute: 0.1 10*3/uL (ref 0.0–0.1)
Basophils Relative: 0 %
Eosinophils Absolute: 0.2 10*3/uL (ref 0.0–0.5)
Eosinophils Relative: 1 %
Immature Granulocytes: 1 %
Lymphocytes Relative: 19 %
Lymphs Abs: 2.6 10*3/uL (ref 0.7–4.0)
Monocytes Absolute: 0.7 10*3/uL (ref 0.1–1.0)
Monocytes Relative: 5 %
Neutro Abs: 9.9 10*3/uL — ABNORMAL HIGH (ref 1.7–7.7)
Neutrophils Relative %: 74 %

## 2023-06-08 LAB — APTT: aPTT: 21 seconds — ABNORMAL LOW (ref 24–36)

## 2023-06-08 LAB — CBG MONITORING, ED: Glucose-Capillary: 122 mg/dL — ABNORMAL HIGH (ref 70–99)

## 2023-06-08 LAB — ETHANOL: Alcohol, Ethyl (B): <10 mg/dL (ref <10)

## 2023-06-08 LAB — PROTIME-INR
INR: 1 (ref 0.8–1.2)
Prothrombin Time: 13.1 seconds (ref 11.4–15.2)

## 2023-06-08 NOTE — Consult Note (Signed)
NEURO HOSPITALIST CONSULT NOTE   Requestig physician: Dr. Donnald Garre  Reason for Consult: Acute onset of RUE weakness and slurred speech.  History obtained from:  EMS, Patient and Chart     HPI:                                                                                                                                          Doris Pacheco is an 82 y.o. female with a PMHx of arthritis, HTN and TIA who presents to the ED from home via EMS as a Code Stroke for acute onset of RUE weakness and slurred speech. LKN was 1900 when family placed her on bedside commode. She had been experiencing copious diarrhea earlier today due to taking a magnesium-based laxative for constipation. EMS was called and on their arrival the family stated that at 1905, while she was on the commode, she suddenly passed out and slumped over. They spent 3-5 minutes trying to arouse her, and when she became conscious, they noted that she had new RUE weakness, RLE weakness and slurred speech with some trouble communicating. On EMS assessment, the right sided deficits were confirmed. They also noted varying aphasia. Family also stated that she had passed out under similar circumstances 2 weeks ago. Vitals per EMS: BP 130/74, CBG 150, HR 90, 98% on RA.   At baseline she is chronically wheelchair bound due to chronic right sided decreased mobility from arthritis involving several of her joints, as well as prior right femur fracture with rod and right knee surgery.   Past Medical History:  Diagnosis Date   Anxiety    Arthritis    GERD (gastroesophageal reflux disease)    History of seasonal allergies    Hypertension    takes meds daily   Stroke [facility])    tia    Past Surgical History:  Procedure Laterality Date   ABDOMINAL HYSTERECTOMY     I & D KNEE WITH POLY EXCHANGE Right 10/26/2012   Procedure: IRRIGATION AND DEBRIDEMENT KNEE WITH POLY EXCHANGE;  Surgeon: Nestor Lewandowsky, MD;  Location: [facility] OR;  Service:  Orthopedics;  Laterality: Right;  poly exchange   INTRAOCULAR LENS INSERTION     right eye   JOINT REPLACEMENT     left hip, right knee   ORIF FEMUR FRACTURE Right 02/03/2016   Procedure: OPEN REDUCTION INTERNAL FIXATION (ORIF) DISTAL FEMUR FRACTURE;  Surgeon: Gean Birchwood, MD;  Location: [facility] OR;  Service: Orthopedics;  Laterality: Right;   PATELLECTOMY Right 10/26/2012   Procedure: PATELLECTOMY;  Surgeon: Nestor Lewandowsky, MD;  Location: [facility] OR;  Service: Orthopedics;  Laterality: Right;   ROTATOR CUFF REPAIR     bilateral   TONSILLECTOMY      Family History  Problem Relation Age of Onset  Cancer Mother        COLON   Diabetes Sister    Stroke Sister    Hypertension Father    Pneumonia Father              Social History:  reports that she has quit smoking. She has never used smokeless tobacco. She reports that she does not drink alcohol and does not use drugs.  Allergies  Allergen Reactions   Forteo [Parathyroid Hormone (Recomb)] Shortness Of Breath   Latex Other (See Comments)    bruising   Tape Rash    Paper tape OK    MEDICATIONS:                                                                                                                     No current facility-administered medications on file prior to encounter.   Current Outpatient Medications on File Prior to Encounter  Medication Sig Dispense Refill   acetaminophen (TYLENOL) 325 MG tablet Take 2 tablets (650 mg total) by mouth every 6 (six) hours as needed for moderate pain, fever or headache. 30 tablet 0   albuterol (VENTOLIN HFA) 108 (90 Base) MCG/ACT inhaler Inhale 1-2 puffs into the lungs every 6 (six) hours as needed for wheezing or shortness of breath. 1 each 0   ammonium lactate (LAC-HYDRIN) 12 % lotion Apply 1 application topically as needed for dry skin. (Patient taking differently: Apply 1 application topically daily as needed for dry skin.) 400 g 2   atenolol (TENORMIN) 25 MG tablet Take 25 mg by mouth daily.      atorvastatin (LIPITOR) 20 MG tablet Take 20 mg by mouth daily.     calcium carbonate (OSCAL) 1500 (600 Ca) MG TABS tablet Take 1,500 mg by mouth daily with breakfast.      cetirizine (ZYRTEC) 10 MG tablet Take 10 mg by mouth daily as needed for allergies.     Cholecalciferol (VITAMIN D3) 25 MCG (1000 UT) CAPS Take 1,000 Units by mouth daily.     diclofenac Sodium (VOLTAREN) 1 % GEL Apply 2 g topically daily as needed (pain).     docusate sodium (COLACE) 100 MG capsule 100 mg daily as needed for mild constipation.     fluticasone (FLONASE) 50 MCG/ACT nasal spray 1 spray daily as needed for allergies.     guaiFENesin (ROBITUSSIN) 100 MG/5ML liquid Take 5-10 mLs (100-200 mg total) by mouth every 4 (four) hours as needed for cough or to loosen phlegm. 60 mL 0   hydrALAZINE (APRESOLINE) 10 MG tablet Take 10 mg by mouth daily.     HYDROcodone-acetaminophen (NORCO/VICODIN) 5-325 MG tablet Take 1 tablet by mouth every 6 (six) hours as needed. (Patient taking differently: Take 1 tablet by mouth every 6 (six) hours as needed for moderate pain.) 6 tablet 0   ibuprofen (ADVIL,MOTRIN) 800 MG tablet Take 800 mg by mouth every 8 (eight) hours as needed for mild pain.     lidocaine (LIDODERM) 5 % Place 1 patch  onto the skin daily. Remove & Discard patch within 12 hours or as directed by MD (Patient not taking: Reported on 09/16/2021) 30 patch 0   meloxicam (MOBIC) 15 MG tablet Take 15 mg by mouth daily as needed for pain.     methocarbamol (ROBAXIN) 500 MG tablet Take 1 tablet (500 mg total) by mouth 2 (two) times daily as needed for muscle spasms. (Patient not taking: Reported on 09/16/2021) 20 tablet 0   potassium chloride (K-DUR,KLOR-CON) 10 MEQ tablet Take 10 mEq by mouth daily.     PREMARIN 0.625 MG tablet Take 0.625 mg by mouth daily as needed (hot flashes).     telmisartan (MICARDIS) 80 MG tablet Take 80 mg by mouth daily as needed (blood pressur).     triamterene-hydrochlorothiazide (MAXZIDE-25) 37.5-25  MG tablet Take 1 tablet by mouth daily.      ROS:                                                                                                                                       As per HPI. Detailed ROS deferred due to acuity of presentation.    Blood pressure (!) 145/69, pulse 98, temperature 98.6 F (37 C), temperature source Oral, resp. rate 16, weight 68 kg, SpO2 99%.   General Examination:                                                                                                       Physical Exam  HEENT-  Stokes/AT    Lungs- Respirations unlabored Extremities- No edema. Scar to right knee from prior operation. Arthritic changes to wrists and fingers bilaterally.   Neurological Examination Mental Status: Alert, oriented to situation, city, state, hospital, year and month, but not the day of the week. Speech fluent with intact naming and comprehension. No dysarthria. Frequently becomes abruptly tearful, which quickly resolves after each occurrence.  Cranial Nerves: II: Temporal visual fields intact with no extinction to DSS. PERRL  III,IV, VI: No ptosis. EOMI.  V: Temp sensation equal bilaterally  VII: Smile symmetric VIII: Hearing intact to voice IX,X: No hoarseness XI: Symmetric  XII: Midline tongue extension Motor: In the context of her arthritis, there is some patchy weakness due to joint pain: RUE 3/5 deltoid, 5/5 biceps, triceps and grip RLE 4-/5 hip flexion, 4/5 knee extension. Can wiggle toes. Limited ROM at ankle due to plantar flexion contracture in the setting of chronic immobility. Drifts to bed within 4 seconds.  LUE 4+/5  deltoid, 5/5 biceps, triceps and grup LLE 4+/5 hip flexion and knee extension.  Can wiggle toes. Limited ROM at ankle due to plantar flexion contracture in the setting of chronic immobility. No drift antigravity.  Elevates RUE less than left when testing Barre, but there is no drift.   Sensory: Temp and light touch intact throughout,  bilaterally. No extinction to DSS.  Deep Tendon Reflexes: 2+ and symmetric bilateral brachioradialis and biceps. Absent right patellar. 2+ left patellar. Absent achilles reflexes.  Cerebellar: No ataxia with FNF bilaterally  Gait: Unable to assess  NIHSS: 1   Lab Results: Basic Metabolic Panel: Recent Labs  Lab 06/08/23 2014 06/08/23 2017  NA 142 142  K 3.5 3.7  CL 109 115*  CO2 15*  --   GLUCOSE 130* 129*  BUN 21 29*  CREATININE 0.75 0.60  CALCIUM 9.9  --     CBC: Recent Labs  Lab 06/08/23 2014 06/08/23 2017  WBC 13.5*  --   NEUTROABS 9.9*  --   HGB 12.4 13.3  HCT 40.1 39.0  MCV 89.9  --   PLT 259  --     Cardiac Enzymes: No results for input(s): "CKTOTAL", "CKMB", "CKMBINDEX", "TROPONINI" in the last 168 hours.  Lipid Panel: No results for input(s): "CHOL", "TRIG", "HDL", "CHOLHDL", "VLDL", "LDLCALC" in the last 168 hours.  Imaging: CT HEAD CODE STROKE WO CONTRAST  Result Date: 06/08/2023 CLINICAL DATA:  Code stroke.  Right arm weakness, slurred speech EXAM: CT HEAD WITHOUT CONTRAST TECHNIQUE: Contiguous axial images were obtained from the base of the skull through the vertex without intravenous contrast. RADIATION DOSE REDUCTION: This exam was performed according to the departmental dose-optimization program which includes automated exposure control, adjustment of the mA and/or kV according to patient size and/or use of iterative reconstruction technique. COMPARISON:  09/16/2021 FINDINGS: Brain: No evidence of acute infarction, hemorrhage, mass, mass effect, or midline shift. No extra-axial collection. Redemonstrated enlargement of the ventricles, similar to the prior exam, favored to represent ex vacuo dilatation in the setting generalized cerebral atrophy. No definite lobar predominance. Periventricular white matter changes, likely the sequela of chronic small vessel ischemic disease. Partial empty sella. Normal craniocervical junction. Vascular: No hyperdense  vessel. Skull: Negative for fracture or focal lesion. Sinuses/Orbits: No acute finding. Other: The mastoid air cells are well aerated. ASPECTS [facility] Stroke Program Early CT Score) - Ganglionic level infarction (caudate, lentiform nuclei, internal capsule, insula, M1-M3 cortex): 7 - Supraganglionic infarction (M4-M6 cortex): 3 Total score (0-10 with 10 being normal): 10 IMPRESSION: 1. No acute intracranial process. 2. ASPECTS is 10. Imaging results were communicated on 06/08/2023 at 8:26 pm to provider Dr. Otelia Limes via secure text paging. Electronically Signed   By: Wiliam Ke M.D.   On: 06/08/2023 20:27     Assessment: 82 y.o. female with a PMHx of arthritis, HTN and TIA who presents to the ED from home via EMS as a Code Stroke for acute onset of RUE weakness and slurred speech. LKN was 1900 when family placed her on bedside commode. She had been experiencing copious diarrhea earlier today due to taking a magnesium-based laxative for constipation. EMS was called and on their arrival the family stated that at 1905, while she was on the commode, she suddenly passed out and slumped over. They spent 3-5 minutes trying to arouse her, and when she became conscious, they noted that she had new RUE weakness, RLE weakness and slurred speech with some trouble communicating. On EMS assessment, the right sided  deficits were confirmed. They also noted varying aphasia. Family also stated that she had passed out under similar circumstances 2 weeks ago. At baseline she is chronically wheelchair bound due to chronic right sided decreased mobility from arthritis involving several of her joints, as well as prior right femur fracture with rod and right knee surgery.  - Exam reveals an awake and fully oriented elderly female with intact speech, no dysarthria, and right greater than left sided weakness in the context of a history of arthritis, prior femur fracture and right knee surgery. The weakness does not correspond to an UMN  lesion, but does correlate with the locations of her stated worst arthritic changes and prior surgeries.  - CT head: No evidence of acute infarction, hemorrhage, mass, mass effect, or midline shift. No extra-axial collection. Redemonstrated enlargement of the ventricles, similar to the prior exam, favored to represent ex vacuo dilatation in the setting generalized cerebral atrophy. No definite lobar predominance. Periventricular white matter changes, likely the sequela of chronic small vessel ischemic disease.  - EKG: Sinus or ectopic atrial rhythm; Abnormal R-wave progression, early transition; isolated prominent q wave in III compared to previous. otherwise no sig ischemic appearance compared to previous - Labs reveal an elevated BUN/Cr ratio suggestive of volume depletion. Low ionized calcium. Na and K normal. Glucose 129. WBC elevated at 13.5.  - Presentation most consistent with watershed TIA symptoms secondary to transient hypotension in the setting of volume depletion and diarrhea secondary to laxative use. Symptoms now resolved. Not a TNK candidate due to symptoms being too mild to treat. Overall presentation not consistent with LVO.   Recommendations: - Hydration per ED - Can obtain an MRI brain if the patient is able to tolerate - Evaluation and management of possible infection in the setting of elevated WBC - Please call Neurology if there are additional questions.     Electronically signed: Dr. Caryl Pina 06/08/2023, 9:18 PM

## 2023-06-08 NOTE — ED Triage Notes (Signed)
Pt bib [facility] from home pt was constipated x1day 1/2. Family placed her on the [facility] at 1900 and at 1905 pt became unresponsive for 3-5 minutes . When she came to she had right side facial droop and right arm weakness with aphasia on and off. 130/74 Hr 90 98% ra 156 cbg

## 2023-06-08 NOTE — Code Documentation (Signed)
Stroke Response Nurse Documentation Code Documentation  Doris Pacheco is a 82 y.o. female arriving to [facility]  via [facility] on 9/19 with past medical hx of HTN, Stroke, Anxiety. On No antithrombotic. Code stroke was activated by EMS.   Patient from home where she was LKW at 1900 and now complaining of right sided weakness and slurred speech. Pt was up using BSC and developed right sided weakness and slurred speech per family.   Stroke team at the bedside on patient arrival. Labs drawn and patient cleared for CT by Dr. Lockie Mola. Patient to CT with team. NIHSS 1, see documentation for details and code stroke times. Patient with right arm weakness on exam. The following imaging was completed:  CT Head. Patient is not a candidate for IV Thrombolytic due to too good to treat. Patient is not a candidate for IR due to low suspicion for LVO.      Bedside handoff with ED RN Whitney.    Rose Fillers  Rapid Response RN

## 2023-06-08 NOTE — ED Provider Notes (Signed)
[place] [facility]ERGENCY DEPARTMENT AT [facility] Provider Note   CSN: [id number] Arrival date & time: 06/08/23  2007     History {Add pertinent medical, surgical, social history, OB history to HPI:1} Chief Complaint  Patient presents with   Code Stroke    Doris Pacheco is a 82 y.o. female.  HPI Patient was brought by EMS as a code stroke.  Patient had become poorly responsive while on the commode and seemed to have slurred speech which was concerning.  Patient's daughter however reports that she had been treating the patient for constipation and given her some magnesium citrate.  The patient also already taken some Maalox.  Patient had an urgency to have a bowel movement and her daughter uses a Product/process development scientist lift to move her onto commode.  The patient then became somewhat unresponsive and was listing over.  Patient reports that she was trying to have a bowel movement and got very weak.  Patient daughter reports that the patient did however go on to have a very large bowel movement evacuation.  Patient reports that she has no abdominal pain and does feel better in that respect.  At the time of my evaluation symptoms had completely resolved and she felt back to normal again.    Home Medications Prior to Admission medications   Medication Sig Start Date End Date Taking? Authorizing Provider  acetaminophen (TYLENOL) 325 MG tablet Take 2 tablets (650 mg total) by mouth every 6 (six) hours as needed for moderate pain, fever or headache. 09/16/21   Edwin Dada P, DO  albuterol (VENTOLIN HFA) 108 (90 Base) MCG/ACT inhaler Inhale 1-2 puffs into the lungs every 6 (six) hours as needed for wheezing or shortness of breath. 09/16/21   Edwin Dada P, DO  ammonium lactate (LAC-HYDRIN) 12 % lotion Apply 1 application topically as needed for dry skin. Patient taking differently: Apply 1 application topically daily as needed for dry skin. 09/01/20   Freddie Breech, DPM  atenolol (TENORMIN) 25 MG  tablet Take 25 mg by mouth daily.    [provider]  atorvastatin (LIPITOR) 20 MG tablet Take 20 mg by mouth daily.    [provider]  calcium carbonate (OSCAL) 1500 (600 Ca) MG TABS tablet Take 1,500 mg by mouth daily with breakfast.     [provider]  cetirizine (ZYRTEC) 10 MG tablet Take 10 mg by mouth daily as needed for allergies.    [provider]  Cholecalciferol (VITAMIN D3) 25 MCG (1000 UT) CAPS Take 1,000 Units by mouth daily.    [provider]  diclofenac Sodium (VOLTAREN) 1 % GEL Apply 2 g topically daily as needed (pain). 03/02/20   [provider]  docusate sodium (COLACE) 100 MG capsule 100 mg daily as needed for mild constipation.    [provider]  fluticasone (FLONASE) 50 MCG/ACT nasal spray 1 spray daily as needed for allergies. 07/30/18   [provider]  guaiFENesin (ROBITUSSIN) 100 MG/5ML liquid Take 5-10 mLs (100-200 mg total) by mouth every 4 (four) hours as needed for cough or to loosen phlegm. 09/16/21   Edwin Dada P, DO  hydrALAZINE (APRESOLINE) 10 MG tablet Take 10 mg by mouth daily.    [provider]  HYDROcodone-acetaminophen (NORCO/VICODIN) 5-325 MG tablet Take 1 tablet by mouth every 6 (six) hours as needed. Patient taking differently: Take 1 tablet by mouth every 6 (six) hours as needed for moderate pain. 09/05/21   Caccavale, Sophia, PA-C  ibuprofen (  ADVIL,MOTRIN) 800 MG tablet Take 800 mg by mouth every 8 (eight) hours as needed for mild pain. 08/09/18   [provider]  lidocaine (LIDODERM) 5 % Place 1 patch onto the skin daily. Remove & Discard patch within 12 hours or as directed by MD Patient not taking: Reported on 09/16/2021 09/05/21   Caccavale, Sophia, PA-C  meloxicam (MOBIC) 15 MG tablet Take 15 mg by mouth daily as needed for pain.    [provider]  methocarbamol (ROBAXIN) 500 MG tablet Take 1 tablet (500 mg total) by mouth 2 (two) times daily as  needed for muscle spasms. Patient not taking: Reported on 09/16/2021 09/05/21   Caccavale, Sophia, PA-C  potassium chloride (K-DUR,KLOR-CON) 10 MEQ tablet Take 10 mEq by mouth daily.    [provider]  PREMARIN 0.625 MG tablet Take 0.625 mg by mouth daily as needed (hot flashes). 05/27/20   [provider]  telmisartan (MICARDIS) 80 MG tablet Take 80 mg by mouth daily as needed (blood pressur).    [provider]  triamterene-hydrochlorothiazide (MAXZIDE-25) 37.5-25 MG tablet Take 1 tablet by mouth daily. 03/14/20   [provider]      Allergies    Forteo [parathyroid hormone (recomb)], Latex, and Tape    Review of Systems   Review of Systems  Physical Exam Updated Vital Signs BP (!) 147/67 (BP Location: Right Arm)   Pulse 82   Temp 98 F (36.7 C) (Oral)   Resp 20   Wt 68 kg   SpO2 100%   BMI 25.73 kg/m  Physical Exam Constitutional:      Comments: Patient is alert and interactive.  No respiratory distress.  HENT:     Head: Normocephalic and atraumatic.     Mouth/Throat:     Pharynx: Oropharynx is clear.  Eyes:     Extraocular Movements: Extraocular movements intact.  Cardiovascular:     Rate and Rhythm: Normal rate and regular rhythm.  Pulmonary:     Effort: Pulmonary effort is normal.     Breath sounds: Normal breath sounds.  Abdominal:     General: There is no distension.     Palpations: Abdomen is soft.     Tenderness: There is no abdominal tenderness. There is no guarding.  Musculoskeletal:        General: No swelling or tenderness. Normal range of motion.     Right lower leg: No edema.     Left lower leg: No edema.  Skin:    General: Skin is warm and dry.  Neurological:     General: No focal deficit present.     Mental Status: She is oriented to person, place, and time.     Motor: No weakness.     Coordination: Coordination normal.     ED Results / Procedures / Treatments   Labs (all labs ordered are listed, but only  abnormal results are displayed) Labs Reviewed  APTT - Abnormal; Notable for the following components:      Result Value   aPTT 21 (*)    All other components within normal limits  CBC - Abnormal; Notable for the following components:   WBC 13.5 (*)    RDW 16.6 (*)    All other components within normal limits  DIFFERENTIAL - Abnormal; Notable for the following components:   Neutro Abs 9.9 (*)    Abs Immature Granulocytes 0.13 (*)    All other components within normal limits  COMPREHENSIVE METABOLIC PANEL - Abnormal; Notable for  the following components:   CO2 15 (*)    Glucose, Bld 130 (*)    Albumin 3.3 (*)    Anion gap 18 (*)    All other components within normal limits  I-STAT CHEM 8, ED - Abnormal; Notable for the following components:   Chloride 115 (*)    BUN 29 (*)    Glucose, Bld 129 (*)    Calcium, Ion 1.14 (*)    TCO2 18 (*)    All other components within normal limits  CBG MONITORING, ED - Abnormal; Notable for the following components:   Glucose-Capillary 122 (*)    All other components within normal limits  ETHANOL  PROTIME-INR    EKG EKG Interpretation Date/Time:  Thursday June 08 2023 21:03:06 EDT Ventricular Rate:  87 PR Interval:  175 QRS Duration:  91 QT Interval:  367 QTC Calculation: 442 R Axis:   41  Text Interpretation: Sinus or ectopic atrial rhythm Abnormal R-wave progression, early transition isolated prominent q wave in III compared to previous. otherwise no sig ischemic appearance compared to previous Confirmed by Arby Barrette [phone]) on 06/08/2023 11:13:22 PM  Radiology CT HEAD CODE STROKE WO CONTRAST  Result Date: 06/08/2023 CLINICAL DATA:  Code stroke.  Right arm weakness, slurred speech EXAM: CT HEAD WITHOUT CONTRAST TECHNIQUE: Contiguous axial images were obtained from the base of the skull through the vertex without intravenous contrast. RADIATION DOSE REDUCTION: This exam was performed according to the departmental  dose-optimization program which includes automated exposure control, adjustment of the mA and/or kV according to patient size and/or use of iterative reconstruction technique. COMPARISON:  09/16/2021 FINDINGS: Brain: No evidence of acute infarction, hemorrhage, mass, mass effect, or midline shift. No extra-axial collection. Redemonstrated enlargement of the ventricles, similar to the prior exam, favored to represent ex vacuo dilatation in the setting generalized cerebral atrophy. No definite lobar predominance. Periventricular white matter changes, likely the sequela of chronic small vessel ischemic disease. Partial empty sella. Normal craniocervical junction. Vascular: No hyperdense vessel. Skull: Negative for fracture or focal lesion. Sinuses/Orbits: No acute finding. Other: The mastoid air cells are well aerated. ASPECTS [facility] Stroke Program Early CT Score) - Ganglionic level infarction (caudate, lentiform nuclei, internal capsule, insula, M1-M3 cortex): 7 - Supraganglionic infarction (M4-M6 cortex): 3 Total score (0-10 with 10 being normal): 10 IMPRESSION: 1. No acute intracranial process. 2. ASPECTS is 10. Imaging results were communicated on 06/08/2023 at 8:26 pm to provider Dr. Otelia Limes via secure text paging. Electronically Signed   By: Wiliam Ke M.D.   On: 06/08/2023 20:27    Procedures Procedures  {Document cardiac monitor, telemetry assessment procedure when appropriate:1}  Medications Ordered in ED Medications - No data to display  ED Course/ Medical Decision Making/ A&P   {   Click here for ABCD2, HEART and other calculatorsREFRESH Note before signing :1}                              Medical Decision Making Amount and/or Complexity of Data Reviewed Labs: ordered. Radiology: ordered.   Patient arrived as a code stroke.  She had an episode of decreased level consciousness and seemed to have right sided facial droop and arm weakness.  This precipitated diagnostic evaluation for  CVA.  She has ruled out for CVA.  {Document critical care time when appropriate:1} {Document review of labs and clinical decision tools ie heart score, Chads2Vasc2 etc:1}  {Document your independent review of radiology  images, and any outside records:1} {Document your discussion with family members, caretakers, and with consultants:1} {Document social determinants of health affecting pt's care:1} {Document your decision making why or why not admission, treatments were needed:1} Final Clinical Impression(s) / ED Diagnoses Final diagnoses:  Vasovagal syncope  Stroke-like symptom    Rx / DC Orders ED Discharge Orders     None

## 2023-06-08 NOTE — Discharge Instructions (Addendum)
1.  At this time you appear to have had an episode of vasovagal syncope.  This occurs in response to a painful episode, especially abdominal pain when trying to have a bowel movement or vomiting.  The blood pressure drops and a person passes out.  Sometimes with passing out, strokelike symptoms can appear.  Evaluation at this time does not show a stroke.  However if you developed any weakness or numbness on one side of your body, sudden change in your vision or other concerning changes, return to the emergency department.  Stroke symptoms are including your discharge instructions for you to review. 2.  Schedule follow-up with your doctor for recheck within the next 2 to 4 days.

## 2023-11-27 ENCOUNTER — Other Ambulatory Visit: Payer: Self-pay

## 2023-11-27 ENCOUNTER — Observation Stay (HOSPITAL_COMMUNITY)
Admission: EM | Admit: 2023-11-27 | Discharge: 2023-11-28 | Disposition: A | Discharge status: Home / Self Care | Attending: Internal Medicine | Admitting: Internal Medicine

## 2023-11-27 ENCOUNTER — Observation Stay (HOSPITAL_COMMUNITY)

## 2023-11-27 ENCOUNTER — Emergency Department (HOSPITAL_COMMUNITY)

## 2023-11-27 ENCOUNTER — Encounter (HOSPITAL_COMMUNITY): Payer: Self-pay | Admitting: Internal Medicine

## 2023-11-27 ENCOUNTER — Observation Stay (HOSPITAL_BASED_OUTPATIENT_CLINIC_OR_DEPARTMENT_OTHER)

## 2023-11-27 DIAGNOSIS — R55 Syncope and collapse: Principal | ICD-10-CM | POA: Insufficient documentation

## 2023-11-27 DIAGNOSIS — Z79899 Other long term (current) drug therapy: Secondary | ICD-10-CM | POA: Diagnosis not present

## 2023-11-27 DIAGNOSIS — Z91040 Latex allergy status: Secondary | ICD-10-CM | POA: Diagnosis not present

## 2023-11-27 DIAGNOSIS — G459 Transient cerebral ischemic attack, unspecified: Secondary | ICD-10-CM

## 2023-11-27 DIAGNOSIS — Z96642 Presence of left artificial hip joint: Secondary | ICD-10-CM | POA: Diagnosis not present

## 2023-11-27 DIAGNOSIS — R41841 Cognitive communication deficit: Secondary | ICD-10-CM | POA: Insufficient documentation

## 2023-11-27 DIAGNOSIS — M1990 Unspecified osteoarthritis, unspecified site: Secondary | ICD-10-CM | POA: Diagnosis present

## 2023-11-27 DIAGNOSIS — Z96651 Presence of right artificial knee joint: Secondary | ICD-10-CM | POA: Diagnosis not present

## 2023-11-27 DIAGNOSIS — I10 Essential (primary) hypertension: Secondary | ICD-10-CM | POA: Diagnosis present

## 2023-11-27 DIAGNOSIS — Z87891 Personal history of nicotine dependence: Secondary | ICD-10-CM | POA: Diagnosis not present

## 2023-11-27 DIAGNOSIS — E785 Hyperlipidemia, unspecified: Secondary | ICD-10-CM | POA: Diagnosis not present

## 2023-11-27 DIAGNOSIS — N179 Acute kidney failure, unspecified: Secondary | ICD-10-CM | POA: Diagnosis not present

## 2023-11-27 DIAGNOSIS — F419 Anxiety disorder, unspecified: Secondary | ICD-10-CM | POA: Insufficient documentation

## 2023-11-27 LAB — DIFFERENTIAL
Abs Immature Granulocytes: 0.05 10*3/uL (ref 0.00–0.07)
Basophils Absolute: 0 10*3/uL (ref 0.0–0.1)
Basophils Relative: 1 %
Eosinophils Absolute: 0.3 10*3/uL (ref 0.0–0.5)
Eosinophils Relative: 4 %
Immature Granulocytes: 1 %
Lymphocytes Relative: 37 %
Lymphs Abs: 2.7 10*3/uL (ref 0.7–4.0)
Monocytes Absolute: 0.6 10*3/uL (ref 0.1–1.0)
Monocytes Relative: 8 %
Neutro Abs: 3.7 10*3/uL (ref 1.7–7.7)
Neutrophils Relative %: 49 %

## 2023-11-27 LAB — COMPREHENSIVE METABOLIC PANEL
ALT: 21 U/L (ref 0–44)
AST: 25 U/L (ref 15–41)
Albumin: 3.5 g/dL (ref 3.5–5.0)
Alkaline Phosphatase: 47 U/L (ref 38–126)
Anion gap: 10 (ref 5–15)
BUN: 44 mg/dL — ABNORMAL HIGH (ref 8–23)
CO2: 22 mmol/L (ref 22–32)
Calcium: 10.4 mg/dL — ABNORMAL HIGH (ref 8.9–10.3)
Chloride: 106 mmol/L (ref 98–111)
Creatinine, Ser: 1.05 mg/dL — ABNORMAL HIGH (ref 0.44–1.00)
GFR, Estimated: 53 mL/min — ABNORMAL LOW (ref >60)
Glucose, Bld: 101 mg/dL — ABNORMAL HIGH (ref 70–99)
Potassium: 3.7 mmol/L (ref 3.5–5.1)
Sodium: 138 mmol/L (ref 135–145)
Total Bilirubin: 0.7 mg/dL (ref 0.0–1.2)
Total Protein: 7.4 g/dL (ref 6.5–8.1)

## 2023-11-27 LAB — CBC
HCT: 39.5 % (ref 36.0–46.0)
Hemoglobin: 12.1 g/dL (ref 12.0–15.0)
MCH: 25.9 pg — ABNORMAL LOW (ref 26.0–34.0)
MCHC: 30.6 g/dL (ref 30.0–36.0)
MCV: 84.4 fL (ref 80.0–100.0)
Platelets: 321 10*3/uL (ref 150–400)
RBC: 4.68 MIL/uL (ref 3.87–5.11)
RDW: 16.9 % — ABNORMAL HIGH (ref 11.5–15.5)
WBC: 7.5 10*3/uL (ref 4.0–10.5)
nRBC: 0 % (ref 0.0–0.2)

## 2023-11-27 LAB — HEMOGLOBIN A1C
Hgb A1c MFr Bld: 5.4 % (ref 4.8–5.6)
Mean Plasma Glucose: 108.28 mg/dL

## 2023-11-27 LAB — PROTIME-INR
INR: 1.1 (ref 0.8–1.2)
Prothrombin Time: 14.3 s (ref 11.4–15.2)

## 2023-11-27 LAB — APTT: aPTT: 35 s (ref 24–36)

## 2023-11-27 LAB — I-STAT CHEM 8, ED
BUN: 41 mg/dL — ABNORMAL HIGH (ref 8–23)
Calcium, Ion: 1.21 mmol/L (ref 1.15–1.40)
Chloride: 109 mmol/L (ref 98–111)
Creatinine, Ser: 1.1 mg/dL — ABNORMAL HIGH (ref 0.44–1.00)
Glucose, Bld: 97 mg/dL (ref 70–99)
HCT: 39 % (ref 36.0–46.0)
Hemoglobin: 13.3 g/dL (ref 12.0–15.0)
Potassium: 3.6 mmol/L (ref 3.5–5.1)
Sodium: 137 mmol/L (ref 135–145)
TCO2: 18 mmol/L — ABNORMAL LOW (ref 22–32)

## 2023-11-27 LAB — ETHANOL: Alcohol, Ethyl (B): <10 mg/dL (ref <10)

## 2023-11-27 MED ORDER — ACETAMINOPHEN 650 MG RE SUPP: 650.0000 mg | RECTAL | Status: DC | PRN

## 2023-11-27 MED ORDER — ACETAMINOPHEN 160 MG/5ML PO SOLN: 650.0000 mg | ORAL | Status: DC | PRN

## 2023-11-27 MED ORDER — STROKE: EARLY STAGES OF RECOVERY BOOK
Freq: Once | Status: AC
Start: 2023-11-28 — End: 2023-11-28
  Filled 2023-11-27: qty 1

## 2023-11-27 MED ORDER — ENOXAPARIN SODIUM 40 MG/0.4ML IJ SOSY
40.0000 mg | PREFILLED_SYRINGE | INTRAMUSCULAR | Status: DC
Start: 2023-11-27 — End: 2023-11-29
  Administered 2023-11-27 – 2023-11-28 (×2): 40 mg via SUBCUTANEOUS
  Filled 2023-11-27 (×2): qty 0.4

## 2023-11-27 MED ORDER — ACETAMINOPHEN 325 MG PO TABS
650.0000 mg | ORAL_TABLET | ORAL | Status: DC | PRN
  Administered 2023-11-27: 650 mg via ORAL
  Filled 2023-11-27: qty 2

## 2023-11-27 MED ORDER — SODIUM CHLORIDE 0.9 % IV BOLUS
500.0000 mL | Freq: Once | INTRAVENOUS | Status: AC
  Administered 2023-11-27: 500 mL via INTRAVENOUS

## 2023-11-27 NOTE — ED Notes (Signed)
 Echo at bedside

## 2023-11-27 NOTE — ED Triage Notes (Signed)
 Patient BIB [facility] from home after period of aphasia, dysarthria, and confusion lasting a few minutes. By EMS arrival symptoms had resolved and patient was A&Ox4. BP 110/80, HR 80, CBG 134

## 2023-11-27 NOTE — ED Provider Notes (Signed)
 [place] EMERGENCY DEPARTMENT AT [facility] Provider Note   CSN: [id number] Arrival date & time: 11/27/23  1104     History  Chief Complaint  Patient presents with   Loss of Consciousness    Doris Pacheco is a 83 y.o. female.  Patient presents after episode of slurred speech this morning that resolved after few minutes.  Possible syncopal event.  Patient has left arm difficulty but that is chronic.  Patient states he feels at baseline denies fevers chills shortness of breath chest pain headache cough.  Patient has possible history of TIA no known stroke history.  The history is provided by the patient.  Loss of Consciousness Associated symptoms: no chest pain, no fever, no headaches, no shortness of breath and no vomiting        Home Medications Prior to Admission medications   Medication Sig Start Date End Date Taking? Authorizing Provider  acetaminophen (TYLENOL) 325 MG tablet Take 2 tablets (650 mg total) by mouth every 6 (six) hours as needed for moderate pain, fever or headache. 09/16/21  Yes Edwin Dada P, DO  albuterol (VENTOLIN HFA) 108 (90 Base) MCG/ACT inhaler Inhale 1-2 puffs into the lungs every 6 (six) hours as needed for wheezing or shortness of breath. 09/16/21  Yes Edwin Dada P, DO  atorvastatin (LIPITOR) 10 MG tablet Take 10 mg by mouth daily.   Yes [provider]  calcium carbonate (OSCAL) 1500 (600 Ca) MG TABS tablet Take 1,500 mg by mouth daily with breakfast.    Yes [provider]  cetirizine (ZYRTEC) 10 MG tablet Take 10 mg by mouth daily as needed for allergies.   Yes [provider]  Cholecalciferol (VITAMIN D3) 25 MCG (1000 UT) CAPS Take 1,000 Units by mouth daily.   Yes [provider]  diclofenac Sodium (VOLTAREN) 1 % GEL Apply 2 g topically daily as needed (pain). 03/02/20  Yes [provider]  docusate sodium (COLACE) 100 MG capsule 100 mg daily as needed for mild constipation.   Yes  [provider]  famotidine (PEPCID) 20 MG tablet Take 20 mg by mouth daily. 11/13/23  Yes [provider]  fluticasone (FLONASE) 50 MCG/ACT nasal spray 1 spray daily as needed for allergies. 07/30/18  Yes [provider]  guaiFENesin (ROBITUSSIN) 100 MG/5ML liquid Take 5-10 mLs (100-200 mg total) by mouth every 4 (four) hours as needed for cough or to loosen phlegm. 09/16/21  Yes Edwin Dada P, DO  ibuprofen (ADVIL,MOTRIN) 800 MG tablet Take 800 mg by mouth every 8 (eight) hours as needed for mild pain. 08/09/18  Yes [provider]  meloxicam (MOBIC) 15 MG tablet Take 15 mg by mouth daily as needed for pain.   Yes [provider]  methenamine (HIPREX) 1 g tablet Take 1 g by mouth 2 (two) times daily with a meal. 01/06/23  Yes [provider]  telmisartan (MICARDIS) 80 MG tablet Take 80 mg by mouth daily as needed (blood pressur).   Yes [provider]  traMADol (ULTRAM) 50 MG tablet Take 50 mg by mouth daily as needed.   Yes [provider]  triamterene-hydrochlorothiazide (MAXZIDE-25) 37.5-25 MG tablet Take 1 tablet by mouth daily. 03/14/20  Yes [provider]  escitalopram (LEXAPRO) 5 MG tablet Take 5 mg by mouth daily. Patient not taking: Reported on 11/27/2023 11/13/23   [provider]      Allergies    Forteo [parathyroid hormone (recomb)], Latex, and Tape  Review of Systems   Review of Systems  Constitutional:  Negative for chills and fever.  HENT:  Negative for congestion.   Eyes:  Negative for visual disturbance.  Respiratory:  Negative for shortness of breath.   Cardiovascular:  Positive for syncope. Negative for chest pain.  Gastrointestinal:  Negative for abdominal pain and vomiting.  Genitourinary:  Negative for dysuria and flank pain.  Musculoskeletal:  Negative for back pain, neck pain and neck stiffness.  Skin:  Negative for rash.  Neurological:  Positive for speech difficulty and  light-headedness. Negative for headaches.    Physical Exam Updated Vital Signs BP 98/76   Pulse 85   Temp 98.5 F (36.9 C) (Oral)   Resp 13   SpO2 100%  Physical Exam Vitals and nursing note reviewed.  Constitutional:      General: She is not in acute distress.    Appearance: She is well-developed.  HENT:     Head: Normocephalic and atraumatic.     Mouth/Throat:     Mouth: Mucous membranes are moist.  Eyes:     General:        Right eye: No discharge.        Left eye: No discharge.     Conjunctiva/sclera: Conjunctivae normal.  Neck:     Trachea: No tracheal deviation.  Cardiovascular:     Rate and Rhythm: Normal rate and regular rhythm.  Pulmonary:     Effort: Pulmonary effort is normal.     Breath sounds: Normal breath sounds.  Abdominal:     General: There is no distension.     Palpations: Abdomen is soft.     Tenderness: There is no abdominal tenderness. There is no guarding.  Musculoskeletal:     Cervical back: Normal range of motion and neck supple. No rigidity.     Comments: Patient has decreased ability to flex left arm and shoulder however mild discomfort and patient states chronic.  Equal strength in all other extremities no drift.  Sensation intact palpation all extremities.  Cranial nerves intact.  Normal speech no facial droop.  Skin:    General: Skin is warm.     Capillary Refill: Capillary refill takes less than 2 seconds.     Findings: No rash.  Neurological:     General: No focal deficit present.     Mental Status: She is alert.     Cranial Nerves: No cranial nerve deficit.  Psychiatric:        Mood and Affect: Mood normal.     ED Results / Procedures / Treatments   Labs (all labs ordered are listed, but only abnormal results are displayed) Labs Reviewed  CBC - Abnormal; Notable for the following components:      Result Value   MCH 25.9 (*)    RDW 16.9 (*)    All other components within normal limits  COMPREHENSIVE METABOLIC PANEL -  Abnormal; Notable for the following components:   Glucose, Bld 101 (*)    BUN 44 (*)    Creatinine, Ser 1.05 (*)    Calcium 10.4 (*)    GFR, Estimated 53 (*)    All other components within normal limits  I-STAT CHEM 8, ED - Abnormal; Notable for the following components:   BUN 41 (*)    Creatinine, Ser 1.10 (*)    TCO2 18 (*)    All other components within normal limits  PROTIME-INR  APTT  DIFFERENTIAL  ETHANOL  HEMOGLOBIN A1C  URINALYSIS, ROUTINE W  REFLEX MICROSCOPIC  LIPID PANEL  CBG MONITORING, ED    EKG None  Radiology MR BRAIN WO CONTRAST Result Date: 11/27/2023 CLINICAL DATA:  Transient ischemic attack EXAM: MRI HEAD WITHOUT CONTRAST TECHNIQUE: Multiplanar, multiecho pulse sequences of the brain and surrounding structures were obtained without intravenous contrast. COMPARISON:  Head CT 09/16/2021 FINDINGS: Brain: No acute infarct, mass effect or extra-axial collection. No acute or chronic hemorrhage. There is multifocal hyperintense T2-weighted signal within the white matter. Generalized volume loss. Ventricular enlargement remains out of proportion to the extra-axial CSF spaces. The midline structures are normal. Vascular: Normal flow voids. Skull and upper cervical spine: Normal calvarium and skull base. Visualized upper cervical spine and soft tissues are normal. Sinuses/Orbits:No paranasal sinus fluid levels or advanced mucosal thickening. No mastoid or middle ear effusion. Normal orbits. IMPRESSION: 1. No acute intracranial abnormality. 2. Ventricular enlargement remains out of proportion to the extra-axial CSF spaces, which can be seen in the setting of normal pressure hydrocephalus. This is unchanged since 09/16/2021. Electronically Signed   By: Deatra Robinson M.D.   On: 11/27/2023 22:20   CT Head Wo Contrast Result Date: 11/27/2023 CLINICAL DATA:  Neuro deficit, acute, stroke suspected. Slurred speech. Syncope. EXAM: CT HEAD WITHOUT CONTRAST TECHNIQUE: Contiguous axial  images were obtained from the base of the skull through the vertex without intravenous contrast. RADIATION DOSE REDUCTION: This exam was performed according to the departmental dose-optimization program which includes automated exposure control, adjustment of the mA and/or kV according to patient size and/or use of iterative reconstruction technique. COMPARISON:  Head CT 06/08/2023 FINDINGS: Brain: There is no evidence of an acute infarct, intracranial hemorrhage, mass, midline shift, or extra-axial fluid collection. Cerebral white matter hypodensities are unchanged and nonspecific but compatible with mild chronic small vessel ischemic disease. Ventricular enlargement is unchanged and again noted to be disproportionate to the cerebral sulci, and this may reflect central predominant cerebral atrophy although normal pressure hydrocephalus is not excluded in the appropriate clinical setting. Vascular: Calcified atherosclerosis at the skull base. No hyperdense vessel. Skull: No acute fracture or suspicious lesion. Sinuses/Orbits: Visualized paranasal sinuses and mastoid air cells are clear. Bilateral cataract extraction. Other: None. IMPRESSION: 1. No evidence of acute intracranial abnormality. 2. Mild chronic small vessel ischemic disease. Electronically Signed   By: Sebastian Ache M.D.   On: 11/27/2023 16:00    Procedures Procedures    Medications Ordered in ED Medications   stroke: early stages of recovery book (has no administration in time range)  acetaminophen (TYLENOL) tablet 650 mg (650 mg Oral Given 11/27/23 2111)    Or  acetaminophen (TYLENOL) 160 MG/5ML solution 650 mg ( Per Tube See Alternative 11/27/23 2111)    Or  acetaminophen (TYLENOL) suppository 650 mg ( Rectal See Alternative 11/27/23 2111)  enoxaparin (LOVENOX) injection 40 mg (40 mg Subcutaneous Given 11/27/23 2112)  sodium chloride 0.9 % bolus 500 mL (0 mLs Intravenous Stopped 11/27/23 1731)    ED Course/ Medical Decision Making/ A&P                                  Medical Decision Making Amount and/or Complexity of Data Reviewed Labs: ordered. ECG/medicine tests: ordered.  Risk Decision regarding hospitalization.   Patient presents after episode of aphasia and near syncope.  Differential includes TIA, dehydration related, hypotension from multiple possible effects.  No signs of anemia on blood work electrolytes unremarkable, acute kidney injury likely from dehydration  BUN 44 creatinine 1.1 increased from previous labs reviewed.  CT scan of the head reviewed results no acute bleeding or stroke seen.  Discussed with neurology patient out of any window no code stroke indicated.  Recommended admission to hospitalist.  Discussed with hospitalist who agrees to admission.  Patient had EKG ordered on arrival however unable to visualize.  Discussed with nursing to repeat EKG to ensure no heart block.  IV and oral fluids given.  Patient stable for admission at this time. Discussed with neurology on-call recommends stroke workup and admission to hospitalist.       Final Clinical Impression(s) / ED Diagnoses Final diagnoses:  TIA (transient ischemic attack)  Acute kidney injury [facility])    Rx / DC Orders ED Discharge Orders     None         Blane Ohara, MD 11/27/23 2340

## 2023-11-27 NOTE — Hospital Course (Addendum)
 83 year old female with history of hypertension, arthritis, TIA, at baseline chronically bed-bound due to chronic right-sided decreased mobility from arthritis of several joints and previous right femur fracture with rod and right knee arthroplasty presented to the ED from home after period of aphasia dysarthria and altered mental status lasting about a minute. I was able to see the conversation in video that her daughter showed  where ith patient was holding a juice/drink bottle and did not know what to do was unable to form sentences or answer questions from daughter was able to say "juice" only.Symptoms were resolved when EMS arrived. In the ED: Vitals stable BP 92-116 systolic, saturating well on room air.  Labs showed stable renal function, normal LFTs and CBC, alcohol less than 10 normal PT/INR.  UA pending. She got 500 cc bolus ns. CT head> no acute finding, mild chronic small vessel ischemic disease. Neurology was consulted and admission requested under hospitalist for further management

## 2023-11-27 NOTE — ED Provider Triage Note (Signed)
 Emergency Medicine Provider Triage Evaluation Note  Doris Pacheco , a 83 y.o. female  was evaluated in triage.  Pt complains of slurred speech.  EMS triage indicates syncope.  At this time patient denying syncope.  She denies she had other focal weakness numbness or tingling.  Review of Systems  Positive: Slurred speech Negative: Headache  Physical Exam  BP 116/72 (BP Location: Left Arm)   Pulse 96   Temp 98.6 F (37 C) (Oral)   Resp 15   SpO2 97%  Gen:   Awake, no distress well appearance Resp:  Normal effort B/L symetric BS MSK:   Moves extremities without difficulty  Other:  No focal neurologic deficit.  Speech is clear at this time.  Content is normal.  Patient has symmetric grip strength.  Patient can extend and move both lower extremities.  Medical Decision Making  Medically screening exam initiated at 11:32 AM.  Appropriate orders placed.  Doris Pacheco was informed that the remainder of the evaluation will be completed by another provider, this initial triage assessment does not replace that evaluation, and the importance of remaining in the ED until their evaluation is complete.  Currently patient does not meet code stroke criteria.  She does not have active symptoms.  By her report main symptom was slurred speech.  No LVO symptoms.  Will proceed with basic stroke workup.   Doris Barrette, MD 11/27/23 1136

## 2023-11-27 NOTE — Progress Notes (Signed)
 Echocardiogram 2D Echocardiogram has been performed.  Doris Pacheco 11/27/2023, 6:51 PM

## 2023-11-27 NOTE — H&P (Addendum)
 History and Physical    Doris Pacheco BMW:[id number] DOB: 31-Dec-1940 DOA: 11/27/2023  PCP: Nona Dell, NP   Patient coming from: Home  Chief Complaint  Patient presents with   Loss of Consciousness     HPI: 83 year old female with history of hypertension, arthritis, TIA, at baseline chronically bed-bound due to chronic right-sided decreased mobility from arthritis of several joints and previous right femur fracture with rod and right knee arthroplasty presented to the ED from home after period of aphasia dysarthria and altered mental status lasting about a minute. I was able to see the conversation in video that her daughter showed  where ith patient was holding a juice/drink bottle and did not know what to do was unable to form sentences or answer questions from daughter was able to say "juice" only.Symptoms were resolved when EMS arrived. In the ED: Vitals stable BP 92-116 systolic, saturating well on room air.  Labs showed stable renal function, normal LFTs and CBC, alcohol less than 10 normal PT/INR.  UA pending. She got 500 cc bolus ns. CT head> no acute finding, mild chronic small vessel ischemic disease. Neurology was consulted and admission requested under hospitalist for further management   Assessment/Plan Principal Problem:   TIA (transient ischemic attack) Active Problems:   Hypertension   Anxiety   Arthritis  TIA with transient episode of aphasia dysarthria and confusion Previous history of TIA: Patient currently at baseline.  Will admit the patient for TIA workup with MRI brain echocardiogram lipid profile A1c.  PT OT speech consult. Neurology has been consulted await further recommendation regarding antiplatelet.  Hypertension: Hold meds-atenolol/hydralazine/telmisartan/Maxide to allow permissive hypertension pending MRI  Significant arthritis/Bed bound: Difficulty moving right side> left at baseline.  Continue her home meds on tramadol and meloxicam- was  trying to reach her doctor this am for oxy rx..  Mild AKI: Baseline creatinine 0.6 in 06/08/2023, on admission 1.0 BUN 44, getting IV fluids 500 cc,  allow oral intake- ivf if not able to take po or iv BP low.  Hyperlipidemia: Resume her Lipitor. There is no height or weight on file to calculate BMI.   Severity of Illness: The appropriate patient status for this patient is OBSERVATION. Observation status is judged to be reasonable and necessary in order to provide the required intensity of service to ensure the patient's safety. The patient's presenting symptoms, physical exam findings, and initial radiographic and laboratory data in the context of their medical condition is felt to place them at decreased risk for further clinical deterioration. Furthermore, it is anticipated that the patient will be medically stable for discharge from the hospital within 2 midnights of admission.    DVT prophylaxis: enoxaparin (LOVENOX) injection 40 mg Start: 11/27/23 1745 Code Status:   Code Status: Do not attempt resuscitation (DNR) PRE-ARREST INTERVENTIONS DESIRED  Family Communication: Admission, patients condition and plan of care including tests being ordered have been discussed with the patient  and her daughter who indicate understanding and agree with the plan and Code Status.  Consults called:  Neurology  Review of Systems: All systems were reviewed and were negative except as mentioned in HPI above. Negative for fever Negative for chest pain Negative for shortness of breath  Past Medical History:  Diagnosis Date   Anxiety    Arthritis    GERD (gastroesophageal reflux disease)    History of seasonal allergies    Hypertension    takes meds daily   Stroke (HCC)    tia  Past Surgical History:  Procedure Laterality Date   ABDOMINAL HYSTERECTOMY     I & D KNEE WITH POLY EXCHANGE Right 10/26/2012   Procedure: IRRIGATION AND DEBRIDEMENT KNEE WITH POLY EXCHANGE;  Surgeon: Nestor Lewandowsky, MD;   Location: [facility] OR;  Service: Orthopedics;  Laterality: Right;  poly exchange   INTRAOCULAR LENS INSERTION     right eye   JOINT REPLACEMENT     left hip, right knee   ORIF FEMUR FRACTURE Right 02/03/2016   Procedure: OPEN REDUCTION INTERNAL FIXATION (ORIF) DISTAL FEMUR FRACTURE;  Surgeon: Gean Birchwood, MD;  Location: [facility] OR;  Service: Orthopedics;  Laterality: Right;   PATELLECTOMY Right 10/26/2012   Procedure: PATELLECTOMY;  Surgeon: Nestor Lewandowsky, MD;  Location: [facility] OR;  Service: Orthopedics;  Laterality: Right;   ROTATOR CUFF REPAIR     bilateral   TONSILLECTOMY       reports that she has quit smoking. She has never used smokeless tobacco. She reports that she does not drink alcohol and does not use drugs.  Allergies  Allergen Reactions   Forteo [Parathyroid Hormone (Recomb)] Shortness Of Breath   Latex Other (See Comments)    bruising   Tape Rash    Paper tape OK    Family History  Problem Relation Age of Onset   Cancer Mother        COLON   Diabetes Sister    Stroke Sister    Hypertension Father    Pneumonia Father      Prior to Admission medications   Medication Sig Start Date End Date Taking? Authorizing Provider  acetaminophen (TYLENOL) 325 MG tablet Take 2 tablets (650 mg total) by mouth every 6 (six) hours as needed for moderate pain, fever or headache. 09/16/21   Edwin Dada P, DO  albuterol (VENTOLIN HFA) 108 (90 Base) MCG/ACT inhaler Inhale 1-2 puffs into the lungs every 6 (six) hours as needed for wheezing or shortness of breath. 09/16/21   Edwin Dada P, DO  ammonium lactate (LAC-HYDRIN) 12 % lotion Apply 1 application topically as needed for dry skin. Patient taking differently: Apply 1 application topically daily as needed for dry skin. 09/01/20   Freddie Breech, DPM  atenolol (TENORMIN) 25 MG tablet Take 25 mg by mouth daily.    [provider]  atorvastatin (LIPITOR) 20 MG tablet Take 20 mg by mouth daily.    [provider]  calcium  carbonate (OSCAL) 1500 (600 Ca) MG TABS tablet Take 1,500 mg by mouth daily with breakfast.     [provider]  cetirizine (ZYRTEC) 10 MG tablet Take 10 mg by mouth daily as needed for allergies.    [provider]  Cholecalciferol (VITAMIN D3) 25 MCG (1000 UT) CAPS Take 1,000 Units by mouth daily.    [provider]  diclofenac Sodium (VOLTAREN) 1 % GEL Apply 2 g topically daily as needed (pain). 03/02/20   [provider]  docusate sodium (COLACE) 100 MG capsule 100 mg daily as needed for mild constipation.    [provider]  fluticasone (FLONASE) 50 MCG/ACT nasal spray 1 spray daily as needed for allergies. 07/30/18   [provider]  guaiFENesin (ROBITUSSIN) 100 MG/5ML liquid Take 5-10 mLs (100-200 mg total) by mouth every 4 (four) hours as needed for cough or to loosen phlegm. 09/16/21   Edwin Dada P, DO  hydrALAZINE (APRESOLINE) 10 MG tablet Take 10 mg by mouth daily.    [provider]  HYDROcodone-acetaminophen (NORCO/VICODIN) 5-325  MG tablet Take 1 tablet by mouth every 6 (six) hours as needed. Patient taking differently: Take 1 tablet by mouth every 6 (six) hours as needed for moderate pain. 09/05/21   Caccavale, Sophia, PA-C  ibuprofen (ADVIL,MOTRIN) 800 MG tablet Take 800 mg by mouth every 8 (eight) hours as needed for mild pain. 08/09/18   [provider]  lidocaine (LIDODERM) 5 % Place 1 patch onto the skin daily. Remove & Discard patch within 12 hours or as directed by MD Patient not taking: Reported on 09/16/2021 09/05/21   Caccavale, Sophia, PA-C  meloxicam (MOBIC) 15 MG tablet Take 15 mg by mouth daily as needed for pain.    [provider]  methocarbamol (ROBAXIN) 500 MG tablet Take 1 tablet (500 mg total) by mouth 2 (two) times daily as needed for muscle spasms. Patient not taking: Reported on 09/16/2021 09/05/21   Caccavale, Sophia, PA-C  potassium chloride (K-DUR,KLOR-CON) 10 MEQ tablet Take 10  mEq by mouth daily.    [provider]  PREMARIN 0.625 MG tablet Take 0.625 mg by mouth daily as needed (hot flashes). 05/27/20   [provider]  telmisartan (MICARDIS) 80 MG tablet Take 80 mg by mouth daily as needed (blood pressur).    [provider]  triamterene-hydrochlorothiazide (MAXZIDE-25) 37.5-25 MG tablet Take 1 tablet by mouth daily. 03/14/20   [provider]    Physical Exam: Vitals:   11/27/23 1123 11/27/23 1536  BP: 116/72 (!) 92/57  Pulse: 96 88  Resp: 15 17  Temp: 98.6 F (37 C) 98.5 F (36.9 C)  TempSrc: Oral Oral  SpO2: 97% 99%    General exam: AAO , debilitated elderly, on room air not in distress. HEENT:Oral mucosa moist, Ear/Nose WNL grossly, dentition normal. Respiratory system: bilaterally clear ,no wheezing or crackles,no use of accessory muscle Cardiovascular system: S1 & S2 +, No JVD,. Gastrointestinal system: Abdomen soft, NT,ND, BS+ Nervous System:Alert, awake, moving upper extremities well, unable to move lower extremities due to chronic arthritis which is unchanged sensation intact in legs and able to wiggle toes mostly in the left leg  which is baseline Extremities: No edema, distal peripheral pulses palpable.  Skin: No rashes,no icterus. MSK: small muscle bulk, low tone/power   Labs on Admission: I have personally reviewed following labs and imaging studies  CBC: Recent Labs  Lab 11/27/23 1146 11/27/23 1151  WBC 7.5  --   NEUTROABS 3.7  --   HGB 12.1 13.3  HCT 39.5 39.0  MCV 84.4  --   PLT 321  --    Basic Metabolic Panel: Recent Labs  Lab 11/27/23 1146 11/27/23 1151  NA 138 137  K 3.7 3.6  CL 106 109  CO2 22  --   GLUCOSE 101* 97  BUN 44* 41*  CREATININE 1.05* 1.10*  CALCIUM 10.4*  --    CrCl cannot be calculated (Unknown ideal weight.). Recent Labs  Lab 11/27/23 1146  AST 25  ALT 21  ALKPHOS 47  BILITOT 0.7  PROT 7.4  ALBUMIN 3.5  No results for input(s): "LIPASE", "AMYLASE" in the  last 168 hours. No results for input(s): "AMMONIA" in the last 168 hours. Coagulation Profile: Recent Labs  Lab 11/27/23 1146  INR 1.1  No results for input(s): "TSH", "T4TOTAL", "FREET4", "T3FREE", "THYROIDAB" in the last 72 hours. Urine analysis:    Component Value Date/Time   COLORURINE GREEN (A) 07/06/2014 1240   APPEARANCEUR CLEAR 07/06/2014 1240   LABSPEC 1.022 07/06/2014 1240   PHURINE  6.0 07/06/2014 1240   GLUCOSEU NEGATIVE 07/06/2014 1240   HGBUR NEGATIVE 07/06/2014 1240   BILIRUBINUR NEGATIVE 07/06/2014 1240   KETONESUR NEGATIVE 07/06/2014 1240   PROTEINUR NEGATIVE 07/06/2014 1240   UROBILINOGEN 0.2 07/06/2014 1240   NITRITE NEGATIVE 07/06/2014 1240   LEUKOCYTESUR NEGATIVE 07/06/2014 1240   Radiological Exams on Admission: CT Head Wo Contrast Result Date: 11/27/2023 CLINICAL DATA:  Neuro deficit, acute, stroke suspected. Slurred speech. Syncope. EXAM: CT HEAD WITHOUT CONTRAST TECHNIQUE: Contiguous axial images were obtained from the base of the skull through the vertex without intravenous contrast. RADIATION DOSE REDUCTION: This exam was performed according to the departmental dose-optimization program which includes automated exposure control, adjustment of the mA and/or kV according to patient size and/or use of iterative reconstruction technique. COMPARISON:  Head CT 06/08/2023 FINDINGS: Brain: There is no evidence of an acute infarct, intracranial hemorrhage, mass, midline shift, or extra-axial fluid collection. Cerebral white matter hypodensities are unchanged and nonspecific but compatible with mild chronic small vessel ischemic disease. Ventricular enlargement is unchanged and again noted to be disproportionate to the cerebral sulci, and this may reflect central predominant cerebral atrophy although normal pressure hydrocephalus is not excluded in the appropriate clinical setting. Vascular: Calcified atherosclerosis at the skull base. No hyperdense vessel. Skull: No acute  fracture or suspicious lesion. Sinuses/Orbits: Visualized paranasal sinuses and mastoid air cells are clear. Bilateral cataract extraction. Other: None. IMPRESSION: 1. No evidence of acute intracranial abnormality. 2. Mild chronic small vessel ischemic disease. Electronically Signed   By: Sebastian Ache M.D.   On: 11/27/2023 16:00   Lanae Boast MD [facility]  If 7PM-7AM, please contact night-coverage [email or web address]  11/27/2023, 5:43 PM

## 2023-11-28 ENCOUNTER — Observation Stay (HOSPITAL_COMMUNITY)

## 2023-11-28 DIAGNOSIS — R4781 Slurred speech: Secondary | ICD-10-CM

## 2023-11-28 DIAGNOSIS — G459 Transient cerebral ischemic attack, unspecified: Secondary | ICD-10-CM | POA: Diagnosis not present

## 2023-11-28 DIAGNOSIS — N179 Acute kidney failure, unspecified: Secondary | ICD-10-CM | POA: Diagnosis not present

## 2023-11-28 DIAGNOSIS — R55 Syncope and collapse: Secondary | ICD-10-CM | POA: Diagnosis not present

## 2023-11-28 LAB — ECHOCARDIOGRAM COMPLETE
AR max vel: 2.04 cm2
AV Peak grad: 5.6 mmHg
Ao pk vel: 1.18 m/s
Area-P 1/2: 4.15 cm2

## 2023-11-28 LAB — LIPID PANEL
Cholesterol: 186 mg/dL (ref 0–200)
HDL: 46 mg/dL (ref >40)
LDL Cholesterol: 122 mg/dL — ABNORMAL HIGH (ref 0–99)
Total CHOL/HDL Ratio: 4 ratio
Triglycerides: 90 mg/dL (ref <150)
VLDL: 18 mg/dL (ref 0–40)

## 2023-11-28 MED ORDER — ATORVASTATIN CALCIUM 10 MG PO TABS
20.0000 mg | ORAL_TABLET | Freq: Every day | ORAL | Status: DC
  Administered 2023-11-28: 20 mg via ORAL
  Filled 2023-11-28: qty 2

## 2023-11-28 MED ORDER — FAMOTIDINE 20 MG PO TABS
20.0000 mg | ORAL_TABLET | Freq: Every day | ORAL | Status: DC
  Administered 2023-11-28: 20 mg via ORAL
  Filled 2023-11-28: qty 1

## 2023-11-28 MED ORDER — CALCIUM CARBONATE 1250 (500 CA) MG PO TABS: 1250.0000 mg | ORAL_TABLET | Freq: Every day | ORAL | Status: DC

## 2023-11-28 MED ORDER — LORATADINE 10 MG PO TABS
10.0000 mg | ORAL_TABLET | Freq: Every day | ORAL | Status: DC
  Administered 2023-11-28: 10 mg via ORAL
  Filled 2023-11-28: qty 1

## 2023-11-28 MED ORDER — METHENAMINE MANDELATE 0.5 G PO TABS
1000.0000 mg | ORAL_TABLET | Freq: Four times a day (QID) | ORAL | Status: DC
  Administered 2023-11-28 (×2): 1000 mg via ORAL
  Filled 2023-11-28 (×3): qty 2

## 2023-11-28 MED ORDER — VITAMIN D 25 MCG (1000 UNIT) PO TABS
1000.0000 [IU] | ORAL_TABLET | Freq: Every day | ORAL | Status: DC
  Administered 2023-11-28: 1000 [IU] via ORAL
  Filled 2023-11-28: qty 1

## 2023-11-28 MED ORDER — ALBUTEROL SULFATE (2.5 MG/3ML) 0.083% IN NEBU: 3.0000 mL | INHALATION_SOLUTION | Freq: Four times a day (QID) | RESPIRATORY_TRACT | Status: DC | PRN

## 2023-11-28 MED ORDER — SENNOSIDES-DOCUSATE SODIUM 8.6-50 MG PO TABS
1.0000 | ORAL_TABLET | Freq: Two times a day (BID) | ORAL | Status: DC
  Administered 2023-11-28: 1 via ORAL
  Filled 2023-11-28: qty 1

## 2023-11-28 MED ORDER — TRAMADOL HCL 50 MG PO TABS: 50.0000 mg | ORAL_TABLET | Freq: Two times a day (BID) | ORAL | Status: DC | PRN

## 2023-11-28 MED ORDER — METHENAMINE HIPPURATE 1 G PO TABS: 1.0000 g | ORAL_TABLET | Freq: Two times a day (BID) | ORAL | Status: DC

## 2023-11-28 NOTE — TOC Initial Note (Signed)
 Transition of Care [facility]) - Initial/Assessment Note    Patient Details  Name: Doris Pacheco MRN: [id number] Date of Birth: 22-May-1941  Transition of Care [facility]) CM/SW Contact:    Cherre Blanc, RN Phone Number: 11/28/2023, 1:50 PM  Clinical Narrative:                 Patient is bed bound and lives at home with her adult daughter and son. Her daughter is in the home most of the time. She has HH PT/OT services in place. TOC CM outreached to [place] at [facility] [phone] to discuss resuming PT/OT when she is discharged. The patient has a hospital bed, lift, motorized chair, and bedside commode in the home. She has a PCP who visits her in the home.  The patient will transition to home via ambulance. Her daughter manages her medications. TOC CM will continue to follow to assess dc needs.  Expected Discharge Plan: Home w Home Health Services (Pt receiving PT/OT from [facility]) Barriers to Discharge: Continued Medical Work up   Patient Goals and CMS Choice            Expected Discharge Plan and Services       Living arrangements for the past 2 months: Single Family Home                           HH Arranged: OT, PT (Services in place. TOC CM will call to confirm.) HH Agency: [facility] Date [facility] Agency Contacted: 11/28/23   Representative spoke with at [facility] Agency: Clifton Custard [phone]  Prior Living Arrangements/Services Living arrangements for the past 2 months: Single Family Home Lives with:: Adult Children (Lives with her daughter and son)                   Activities of Daily Living   ADL Screening (condition at time of admission) Independently performs ADLs?: No Does the patient have a NEW difficulty with bathing/dressing/toileting/self-feeding that is expected to last >3 days?: No (needs assist) Does the patient have a NEW difficulty with getting in/out of bed, walking, or climbing stairs that is expected to last >3 days?: No (needs assist out bed  with cane / walker / or up to wheelchair) Does the patient have a NEW difficulty with communication that is expected to last >3 days?: No Is the patient deaf or have difficulty hearing?: No Does the patient have difficulty seeing, even when wearing glasses/contacts?: No Does the patient have difficulty concentrating, remembering, or making decisions?: No  Permission Sought/Granted                  Emotional Assessment              Admission diagnosis:  TIA (transient ischemic attack) [G45.9] Acute kidney injury [facility]) [N17.9] Patient Active Problem List   Diagnosis Date Noted   TIA (transient ischemic attack) 11/27/2023   Chronic pancreatitis (HCC) 09/01/2020   Epigastric pain 09/01/2020   Family history of malignant neoplasm of gastrointestinal tract 09/01/2020   Imaging of gastrointestinal tract abnormal 09/01/2020   Left lower quadrant pain 09/01/2020   History of colonic polyps 09/01/2020   Slow transit constipation 09/01/2020   Weight decreased 09/01/2020   Hypothyroidism 03/02/2016   Hyperlipidemia 03/02/2016   Closed fracture of right distal femur (HCC) 02/03/2016   Abrasion, knee    Fracture of femur, distal, right, closed (HCC) 02/02/2016   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  Anemia 02/02/2016   Closed fracture of distal end of right femur, initial encounter (HCC) 02/02/2016   Essential hypertension    Fall    Osteoporosis 02/18/2015   Vertebral fracture, osteoporotic (HCC) 02/18/2015   Bilateral low back pain without sciatica 02/18/2015   Midline low back pain without sciatica 01/08/2015   Chest pain 07/30/2013   Hypertension    Anxiety    History of seasonal allergies    History of CVA (cerebrovascular accident)    GERD (gastroesophageal reflux disease)    Arthritis    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   PCP:  Nona Dell, NP Pharmacy:   [facility] [phone] - [place], [place] - [address] AT  [facility] [address]  [place] [address] Phone: [phone] Fax: [phone]     Social Drivers of Health (SDOH) Social History: SDOH Screenings   Food Insecurity: No Food Insecurity (11/27/2023)  Housing: Low Risk  (11/27/2023)  Transportation Needs: No Transportation Needs (11/27/2023)  Utilities: Not At Risk (11/27/2023)  Depression (PHQ2-9): Low Risk  (03/16/2021)  Social Connections: Socially Isolated (11/27/2023)  Tobacco Use: Medium Risk (11/27/2023)   SDOH Interventions:     Readmission Risk Interventions     No data to display

## 2023-11-28 NOTE — TOC Transition Note (Signed)
 Transition of Care [facility]) - Discharge Note   Patient Details  Name: Doris Pacheco MRN: [id number] Date of Birth: 01-27-1941  Transition of Care [facility]) CM/SW Contact:  Cherre Blanc, RN Phone Number: 11/28/2023, 2:13 PM   Clinical Narrative:     Patient to transfer to home via PTAR. Clifton Custard with [facility] outreached to resume [facility] PT/OT services. No new DME needs identified. Daughter in room in agreement with transition to home.     Barriers to Discharge: Continued Medical Work up   Patient Goals and CMS Choice            Discharge Placement                       Discharge Plan and Services Additional resources added to the After Visit Summary for                            [facility] Arranged: OT, PT (Services in place. TOC CM will call to confirm.) [facility] Agency: [facility] Date [facility] Agency Contacted: 11/28/23   Representative spoke with at [facility] Agency: Clifton Custard [phone]  Social Drivers of Health (SDOH) Interventions SDOH Screenings   Food Insecurity: No Food Insecurity (11/27/2023)  Housing: Low Risk  (11/27/2023)  Transportation Needs: No Transportation Needs (11/27/2023)  Utilities: Not At Risk (11/27/2023)  Depression (PHQ2-9): Low Risk  (03/16/2021)  Social Connections: Socially Isolated (11/27/2023)  Tobacco Use: Medium Risk (11/27/2023)     Readmission Risk Interventions     No data to display

## 2023-11-28 NOTE — Discharge Summary (Signed)
 Physician Discharge Summary  Doris Pacheco VHQ:[id number] DOB: [birth year] DOA: 11/27/2023  PCP: Nona Dell, NP  Admit date: 11/27/2023 Discharge date: 11/28/2023 Recommendations for Outpatient Follow-up:  Follow up with PCP in 1 weeks-call for appointment Please obtain BMP/CBC in one week  Discharge Dispo: Home Discharge Condition: Stable Code Status:   Code Status: Do not attempt resuscitation (DNR) PRE-ARREST INTERVENTIONS DESIRED Diet recommendation:  Diet Order             Diet regular Room service appropriate? Yes; Fluid consistency: Thin  Diet effective now                    Brief/Interim Summary: 83 year old female with history of hypertension, arthritis, TIA, at baseline chronically bed-bound due to chronic right-sided decreased mobility from arthritis of several joints and previous right femur fracture with rod and right knee arthroplasty presented to the ED from home after period of aphasia dysarthria and altered mental status lasting about a minute. I was able to see the conversation in video that her daughter showed  where ith patient was holding a juice/drink bottle and did not know what to do was unable to form sentences or answer questions from daughter was able to say "juice" only.Symptoms were resolved when EMS arrived. In the ED: Vitals stable BP 92-116 systolic, saturating well on room air.  Labs showed stable renal function, normal LFTs and CBC, alcohol less than 10 normal PT/INR.  UA pending. She got 500 cc bolus ns. CT head> no acute finding, mild chronic small vessel ischemic disease.  MRI brain no acute stroke, even likely in the setting of dehydration hypertension versus TIA.  Seen by neurology and at this time cleared for discharge home     Discharge Diagnoses:  Principal Problem:   TIA (transient ischemic attack) Active Problems:   Hypertension   Anxiety   Arthritis   Transient confusion/dysarthria likely due to dehydration and volume  depletion less likely TIA: Neurology was consulted and admission requested under hospitalist for further management..   Underwent MRI brain no acute finding, LDL at 122, A1c 5.4.  EEG showed mild diffuse encephalopathy with no seizure, Question potentially dehydration hypotension causing her symptoms vs TIA. BP remains stable without meds for now.  Echo obtained showed EF 70-75% G1 DD. At this time back to baseline, once cleared by neurology plan for discharge home Discussed with Dr. Lawerance Cruel from neurology and okay for discharge home-no need for aspirin  Hypertension: BP stable resume meds slowly, so follow-up with PCP  AKI; Improved with IV fluids  Hyperlipidemia: LDL slightly up discuss with PCP continue home atorvastatin   Significant arthritis of lower extremity and bedbound continue home medication and pain control  Consults: Neurology Subjective: Alert awake oriented resting comfortably daughter at bedside.  Eager to go home today  Discharge Exam: Vitals:   11/28/23 0840 11/28/23 1212  BP: (!) 145/50 (!) 104/58  Pulse: 78 80  Resp:    Temp: 97.9 F (36.6 C) 98.4 F (36.9 C)  SpO2: 100% 100%   General: Pt is alert, awake, not in acute distress Cardiovascular: RRR, S1/S2 +, no rubs, no gallops Respiratory: CTA bilaterally, no wheezing, no rhonchi Abdominal: Soft, NT, ND, bowel sounds + Extremities: no edema, no cyanosis  Discharge Instructions  Discharge Instructions     Discharge instructions   Complete by: As directed    Please call call MD or return to ER for similar or worsening recurring problem that brought you to hospital  or if any fever,nausea/vomiting,abdominal pain, uncontrolled pain, chest pain,  shortness of breath or any other alarming symptoms.  Please follow-up your doctor as instructed in a week time and call the office for appointment.  Please avoid alcohol, smoking, or any other illicit substance and maintain healthy habits including taking your  regular medications as prescribed.  You were cared for by a hospitalist during your hospital stay. If you have any questions about your discharge medications or the care you received while you were in the hospital after you are discharged, you can call the unit and ask to speak with the hospitalist on call if the hospitalist that took care of you is not available.  Once you are discharged, your primary care physician will handle any further medical issues. Please note that NO REFILLS for any discharge medications will be authorized once you are discharged, as it is imperative that you return to your primary care physician (or establish a relationship with a primary care physician if you do not have one) for your aftercare needs so that they can reassess your need for medications and monitor your lab values   Increase activity slowly   Complete by: As directed       Allergies as of 11/28/2023       Reactions   Forteo [parathyroid Hormone (recomb)] Shortness Of Breath   Latex Other (See Comments)   bruising   Tape Rash   Paper tape OK        Medication List     STOP taking these medications    triamterene-hydrochlorothiazide 37.5-25 MG tablet Commonly known as: MAXZIDE-25       TAKE these medications    acetaminophen 325 MG tablet Commonly known as: Tylenol Take 2 tablets (650 mg total) by mouth every 6 (six) hours as needed for moderate pain, fever or headache.   albuterol 108 (90 Base) MCG/ACT inhaler Commonly known as: VENTOLIN HFA Inhale 1-2 puffs into the lungs every 6 (six) hours as needed for wheezing or shortness of breath.   atorvastatin 10 MG tablet Commonly known as: LIPITOR Take 10 mg by mouth daily.   calcium carbonate 1500 (600 Ca) MG Tabs tablet Commonly known as: OSCAL Take 1,500 mg by mouth daily with breakfast.   cetirizine 10 MG tablet Commonly known as: ZYRTEC Take 10 mg by mouth daily as needed for allergies.   diclofenac Sodium 1 %  Gel Commonly known as: VOLTAREN Apply 2 g topically daily as needed (pain).   docusate sodium 100 MG capsule Commonly known as: COLACE 100 mg daily as needed for mild constipation.   escitalopram 5 MG tablet Commonly known as: LEXAPRO Take 5 mg by mouth daily.   famotidine 20 MG tablet Commonly known as: PEPCID Take 20 mg by mouth daily.   fluticasone 50 MCG/ACT nasal spray Commonly known as: FLONASE 1 spray daily as needed for allergies.   guaiFENesin 100 MG/5ML liquid Commonly known as: ROBITUSSIN Take 5-10 mLs (100-200 mg total) by mouth every 4 (four) hours as needed for cough or to loosen phlegm.   ibuprofen 800 MG tablet Commonly known as: ADVIL Take 800 mg by mouth every 8 (eight) hours as needed for mild pain.   meloxicam 15 MG tablet Commonly known as: MOBIC Take 15 mg by mouth daily as needed for pain.   methenamine 1 g tablet Commonly known as: HIPREX Take 1 g by mouth 2 (two) times daily with a meal.   telmisartan 80 MG tablet Commonly known as:  MICARDIS Take 80 mg by mouth daily as needed (blood pressur).   traMADol 50 MG tablet Commonly known as: ULTRAM Take 50 mg by mouth daily as needed.   Vitamin D3 25 MCG (1000 UT) Caps Take 1,000 Units by mouth daily.        Follow-up Information     Schmerge, Belenda Cruise, NP Follow up in 1 week(s).   Specialty: Nurse Practitioner Contact information: [address] [place] [address] [phone]                Allergies  Allergen Reactions   Forteo [Parathyroid Hormone (Recomb)] Shortness Of Breath   Latex Other (See Comments)    bruising   Tape Rash    Paper tape OK    The results of significant diagnostics from this hospitalization (including imaging, microbiology, ancillary and laboratory) are listed below for reference.    Microbiology: No results found for this or any previous visit (from the past 240 hours).  Procedures/Studies: ECHOCARDIOGRAM COMPLETE Result Date:  11/28/2023    ECHOCARDIOGRAM REPORT   Patient Name:   Doris Pacheco Date of Exam: 11/27/2023 Medical Rec #:  [id number]     Height:       64.0 in Accession #:    [id number]    Weight:       149.9 lb Date of Birth:  [birth year]     BSA:          1.731 m Patient Age:    83 years      BP:           92/57 mmHg Patient Gender: F             HR:           85 bpm. Exam Location:  Inpatient Procedure: 2D Echo, Cardiac Doppler and Color Doppler (Both Spectral and Color            Flow Doppler were utilized during procedure). Indications:    TIA G45.9  History:        Patient has prior history of Echocardiogram examinations, most                 recent 06/04/2015. TIA and Stroke, Signs/Symptoms:Chest Pain;                 Risk Factors:Hypertension and Dyslipidemia.  Sonographer:    Lucendia Herrlich RCS Referring Phys: [id number] Ivorie Uplinger IMPRESSIONS  1. Left ventricular ejection fraction, by estimation, is 70 to 75%. The left ventricle has hyperdynamic function. The left ventricle has no regional wall motion abnormalities. Left ventricular diastolic parameters are consistent with Grade I diastolic dysfunction (impaired relaxation).  2. Right ventricular systolic function is normal. The right ventricular size is normal.  3. The mitral valve is normal in structure. Trivial mitral valve regurgitation. No evidence of mitral stenosis.  4. The aortic valve is tricuspid. There is mild calcification of the aortic valve. Aortic valve regurgitation is not visualized. No aortic stenosis is present.  5. The inferior vena cava is normal in size with greater than 50% respiratory variability, suggesting right atrial pressure of 3 mmHg. FINDINGS  Left Ventricle: Left ventricular ejection fraction, by estimation, is 70 to 75%. The left ventricle has hyperdynamic function. The left ventricle has no regional wall motion abnormalities. The left ventricular internal cavity size was normal in size. There is no left ventricular hypertrophy. Left  ventricular diastolic parameters are consistent with Grade I diastolic dysfunction (impaired relaxation). Right Ventricle:  The right ventricular size is normal. No increase in right ventricular wall thickness. Right ventricular systolic function is normal. Left Atrium: Left atrial size was normal in size. Right Atrium: Right atrial size was normal in size. Pericardium: There is no evidence of pericardial effusion. Mitral Valve: The mitral valve is normal in structure. Trivial mitral valve regurgitation. No evidence of mitral valve stenosis. Tricuspid Valve: The tricuspid valve is normal in structure. Tricuspid valve regurgitation is trivial. No evidence of tricuspid stenosis. Aortic Valve: The aortic valve is tricuspid. There is mild calcification of the aortic valve. Aortic valve regurgitation is not visualized. No aortic stenosis is present. Aortic valve peak gradient measures 5.6 mmHg. Pulmonic Valve: The pulmonic valve was normal in structure. Pulmonic valve regurgitation is not visualized. No evidence of pulmonic stenosis. Aorta: The aortic root is normal in size and structure. Venous: The inferior vena cava is normal in size with greater than 50% respiratory variability, suggesting right atrial pressure of 3 mmHg. IAS/Shunts: No atrial level shunt detected by color flow Doppler.  LEFT VENTRICLE PLAX 2D LVIDd:         3.30 cm   Diastology LV PW:         0.80 cm   LV e' medial:    4.24 cm/s LV IVS:        1.00 cm   LV E/e' medial:  13.7 LVOT diam:     2.00 cm   LV e' lateral:   8.81 cm/s LV SV:         41        LV E/e' lateral: 6.6 LV SV Index:   24 LVOT Area:     3.14 cm  RIGHT VENTRICLE             IVC RV S prime:     11.50 cm/s  IVC diam: 0.90 cm TAPSE (M-mode): 1.4 cm LEFT ATRIUM             Index        RIGHT ATRIUM          Index LA diam:        2.80 cm 1.62 cm/m   RA Area:     6.58 cm LA Vol (A2C):   49.4 ml 28.54 ml/m  RA Volume:   8.69 ml  5.02 ml/m LA Vol (A4C):   39.9 ml 23.05 ml/m LA Biplane  Vol: 47.0 ml 27.16 ml/m  AORTIC VALVE AV Area (Vmax): 2.04 cm AV Vmax:        118.00 cm/s AV Peak Grad:   5.6 mmHg LVOT Vmax:      76.50 cm/s LVOT Vmean:     50.850 cm/s LVOT VTI:       0.131 m  AORTA Ao Root diam: 3.30 cm Ao Asc diam:  3.30 cm MITRAL VALVE MV Area (PHT): 4.15 cm     SHUNTS MV Decel Time: 183 msec     Systemic VTI:  0.13 m MV E velocity: 58.20 cm/s   Systemic Diam: 2.00 cm MV A velocity: 101.00 cm/s MV E/A ratio:  0.58 Arvilla Meres MD Electronically signed by Arvilla Meres MD Signature Date/Time: 11/28/2023/11:53:03 AM    Final    EEG adult Result Date: 11/28/2023 Charlsie Quest, MD     11/28/2023  8:41 AM Patient Name: Doris Pacheco MRN: [id number] Epilepsy Attending: Charlsie Quest Referring Physician/Provider: Erick Blinks, MD Date: 11/27/2023 Duration: 26.20 mins Patient history: 83yo F with an episode of slurred speech, word  finding difficulty Level of alertness: Awake, asleep AEDs during EEG study: None Technical aspects: This EEG study was done with scalp electrodes positioned according to the 10-20 International system of electrode placement. Electrical activity was reviewed with band pass filter of 1-70Hz , sensitivity of 7 uV/mm, display speed of 30mm/sec with a 60Hz  notched filter applied as appropriate. EEG data were recorded continuously and digitally stored.  Video monitoring was available and reviewed as appropriate. Description: The posterior dominant rhythm consists of 7.5 Hz activity of moderate voltage (25-35 uV) seen predominantly in posterior head regions, symmetric and reactive to eye opening and eye closing. EEG showed continuous generalized 5 to 7 Hz theta slowing. Hyperventilation and photic stimulation were not performed.   ABNORMALITY - Continuous slow, generalized IMPRESSION: This study is suggestive of mild diffuse encephalopathy. No seizures or epileptiform discharges were seen throughout the recording. Charlsie Quest   MR ANGIO HEAD WO  CONTRAST Result Date: 11/28/2023 CLINICAL DATA:  Acute neurologic deficit EXAM: MRA NECK WITHOUT CONTRAST MRA HEAD WITHOUT CONTRAST TECHNIQUE: Angiographic images of the Circle of Willis were acquired using MRA technique without intravenous contrast. COMPARISON:  None Available. FINDINGS: MRA NECK FINDINGS Aortic arch: Visualized portion is unremarkable. Right carotid system: Normal Left carotid system: There is multifocal loss of flow related enhancement of the left ICA, at the ICA origin and just proximal to the skull base. Vertebral arteries: Left dominant system. Normal appearance of the left vertebral artery. Poor flow related enhancement of the distal right vertebral artery which could be a motion artifact. Other: Motion degraded study. MRA HEAD FINDINGS POSTERIOR CIRCULATION: Normal V4 segments. Right vertebral artery terminates in PICA. Basilar artery is normal. Superior cerebellar arteries are normal. Posterior cerebral arteries are normal. ANTERIOR CIRCULATION: Intracranial internal carotid arteries are normal. Anterior cerebral arteries are normal. Middle cerebral arteries are normal. Anatomic Variants: Fetal origin of the right PCA. Other: Motion degraded study. IMPRESSION: 1. Motion degraded study. 2. Multifocal loss of flow related enhancement of the left ICA, at the ICA origin and just proximal to the skull base. This may be due to motion artifact or occlusion/stenosis. CTA of the neck may more clearly characterize this finding. 3. Poor flow related enhancement of the distal right vertebral artery which could be a motion artifact. 4. No intracranial large vessel occlusion or significant stenosis. Electronically Signed   By: Deatra Robinson M.D.   On: 11/28/2023 03:52   MR ANGIO NECK WO CONTRAST Result Date: 11/28/2023 CLINICAL DATA:  Acute neurologic deficit EXAM: MRA NECK WITHOUT CONTRAST MRA HEAD WITHOUT CONTRAST TECHNIQUE: Angiographic images of the Circle of Willis were acquired using MRA  technique without intravenous contrast. COMPARISON:  None Available. FINDINGS: MRA NECK FINDINGS Aortic arch: Visualized portion is unremarkable. Right carotid system: Normal Left carotid system: There is multifocal loss of flow related enhancement of the left ICA, at the ICA origin and just proximal to the skull base. Vertebral arteries: Left dominant system. Normal appearance of the left vertebral artery. Poor flow related enhancement of the distal right vertebral artery which could be a motion artifact. Other: Motion degraded study. MRA HEAD FINDINGS POSTERIOR CIRCULATION: Normal V4 segments. Right vertebral artery terminates in PICA. Basilar artery is normal. Superior cerebellar arteries are normal. Posterior cerebral arteries are normal. ANTERIOR CIRCULATION: Intracranial internal carotid arteries are normal. Anterior cerebral arteries are normal. Middle cerebral arteries are normal. Anatomic Variants: Fetal origin of the right PCA. Other: Motion degraded study. IMPRESSION: 1. Motion degraded study. 2. Multifocal loss of flow related  enhancement of the left ICA, at the ICA origin and just proximal to the skull base. This may be due to motion artifact or occlusion/stenosis. CTA of the neck may more clearly characterize this finding. 3. Poor flow related enhancement of the distal right vertebral artery which could be a motion artifact. 4. No intracranial large vessel occlusion or significant stenosis. Electronically Signed   By: Deatra Robinson M.D.   On: 11/28/2023 03:52   MR BRAIN WO CONTRAST Result Date: 11/27/2023 CLINICAL DATA:  Transient ischemic attack EXAM: MRI HEAD WITHOUT CONTRAST TECHNIQUE: Multiplanar, multiecho pulse sequences of the brain and surrounding structures were obtained without intravenous contrast. COMPARISON:  Head CT 09/16/2021 FINDINGS: Brain: No acute infarct, mass effect or extra-axial collection. No acute or chronic hemorrhage. There is multifocal hyperintense T2-weighted signal  within the white matter. Generalized volume loss. Ventricular enlargement remains out of proportion to the extra-axial CSF spaces. The midline structures are normal. Vascular: Normal flow voids. Skull and upper cervical spine: Normal calvarium and skull base. Visualized upper cervical spine and soft tissues are normal. Sinuses/Orbits:No paranasal sinus fluid levels or advanced mucosal thickening. No mastoid or middle ear effusion. Normal orbits. IMPRESSION: 1. No acute intracranial abnormality. 2. Ventricular enlargement remains out of proportion to the extra-axial CSF spaces, which can be seen in the setting of normal pressure hydrocephalus. This is unchanged since 09/16/2021. Electronically Signed   By: Deatra Robinson M.D.   On: 11/27/2023 22:20   CT Head Wo Contrast Result Date: 11/27/2023 CLINICAL DATA:  Neuro deficit, acute, stroke suspected. Slurred speech. Syncope. EXAM: CT HEAD WITHOUT CONTRAST TECHNIQUE: Contiguous axial images were obtained from the base of the skull through the vertex without intravenous contrast. RADIATION DOSE REDUCTION: This exam was performed according to the departmental dose-optimization program which includes automated exposure control, adjustment of the mA and/or kV according to patient size and/or use of iterative reconstruction technique. COMPARISON:  Head CT 06/08/2023 FINDINGS: Brain: There is no evidence of an acute infarct, intracranial hemorrhage, mass, midline shift, or extra-axial fluid collection. Cerebral white matter hypodensities are unchanged and nonspecific but compatible with mild chronic small vessel ischemic disease. Ventricular enlargement is unchanged and again noted to be disproportionate to the cerebral sulci, and this may reflect central predominant cerebral atrophy although normal pressure hydrocephalus is not excluded in the appropriate clinical setting. Vascular: Calcified atherosclerosis at the skull base. No hyperdense vessel. Skull: No acute fracture  or suspicious lesion. Sinuses/Orbits: Visualized paranasal sinuses and mastoid air cells are clear. Bilateral cataract extraction. Other: None. IMPRESSION: 1. No evidence of acute intracranial abnormality. 2. Mild chronic small vessel ischemic disease. Electronically Signed   By: Sebastian Ache M.D.   On: 11/27/2023 16:00    Labs: BNP (last 3 results) No results for input(s): "BNP" in the last 8760 hours. Basic Metabolic Panel: Recent Labs  Lab 11/27/23 1146 11/27/23 1151  NA 138 137  K 3.7 3.6  CL 106 109  CO2 22  --   GLUCOSE 101* 97  BUN 44* 41*  CREATININE 1.05* 1.10*  CALCIUM 10.4*  --    Liver Function Tests: Recent Labs  Lab 11/27/23 1146  AST 25  ALT 21  ALKPHOS 47  BILITOT 0.7  PROT 7.4  ALBUMIN 3.5   No results for input(s): "LIPASE", "AMYLASE" in the last 168 hours. No results for input(s): "AMMONIA" in the last 168 hours. CBC: Recent Labs  Lab 11/27/23 1146 11/27/23 1151  WBC 7.5  --   NEUTROABS 3.7  --  HGB 12.1 13.3  HCT 39.5 39.0  MCV 84.4  --   PLT 321  --   Hgb A1c Recent Labs    11/27/23 1146  HGBA1C 5.4   Lipid Profile Recent Labs    11/28/23 0903  CHOL 186  HDL 46  LDLCALC 122*  TRIG 90  CHOLHDL 4.0  Urinalysis    Component Value Date/Time   COLORURINE GREEN (A) 07/06/2014 1240   APPEARANCEUR CLEAR 07/06/2014 1240   LABSPEC 1.022 07/06/2014 1240   PHURINE 6.0 07/06/2014 1240   GLUCOSEU NEGATIVE 07/06/2014 1240   HGBUR NEGATIVE 07/06/2014 1240   BILIRUBINUR NEGATIVE 07/06/2014 1240   KETONESUR NEGATIVE 07/06/2014 1240   PROTEINUR NEGATIVE 07/06/2014 1240   UROBILINOGEN 0.2 07/06/2014 1240   NITRITE NEGATIVE 07/06/2014 1240   LEUKOCYTESUR NEGATIVE 07/06/2014 1240   Sepsis Labs Recent Labs  Lab 11/27/23 1146  WBC 7.5   Microbiology No results found for this or any previous visit (from the past 240 hours).  Time coordinating discharge: 25 minutes  SIGNED: Lanae Boast, MD  [facility] 11/28/2023, 2:02 PM  If  7PM-7AM, please contact night-coverage [email or web address]

## 2023-11-28 NOTE — Progress Notes (Signed)
 Patient is not available for EEG at the moment due to going to imaging.  Nurse will notify tech once returned from MRI.

## 2023-11-28 NOTE — Progress Notes (Signed)
 TOC CM provided the outpatient observation notice to the patient. Patient verbalized understanding and signed the document.

## 2023-11-28 NOTE — Progress Notes (Signed)
 Patient discharged to home, AVS reviewed with her daughter Raahi Korber. IV removed. VSS. [facility] provided transportation.   Notified daughter that patient left the floor with [facility]

## 2023-11-28 NOTE — Progress Notes (Signed)
 Patient has a discolored but healed area to her right buttocks. Skin is intact. Sacral foam applied. Per patient she did have a wound some time ago.

## 2023-11-28 NOTE — Progress Notes (Signed)
 EEG complete - results pending

## 2023-11-28 NOTE — Care Management Obs Status (Signed)
 MEDICARE OBSERVATION STATUS NOTIFICATION   Patient Details  Name: Doris Pacheco MRN: [id number] Date of Birth: 1940/11/06   Medicare Observation Status Notification Given:       Cherre Blanc, RN 11/28/2023, 11:08 AM

## 2023-11-28 NOTE — Procedures (Signed)
 Patient Name: MAURY BAMBA  MRN: [id number]  Epilepsy Attending: Charlsie Quest  Referring Physician/Provider: Erick Blinks, MD  Date: 11/27/2023 Duration: 26.20 mins  Patient history: 83yo F with an episode of slurred speech, word finding difficulty   Level of alertness: Awake, asleep  AEDs during EEG study: None  Technical aspects: This EEG study was done with scalp electrodes positioned according to the 10-20 International system of electrode placement. Electrical activity was reviewed with band pass filter of 1-70Hz , sensitivity of 7 uV/mm, display speed of 41mm/sec with a 60Hz  notched filter applied as appropriate. EEG data were recorded continuously and digitally stored.  Video monitoring was available and reviewed as appropriate.  Description: The posterior dominant rhythm consists of 7.5 Hz activity of moderate voltage (25-35 uV) seen predominantly in posterior head regions, symmetric and reactive to eye opening and eye closing. EEG showed continuous generalized 5 to 7 Hz theta slowing. Hyperventilation and photic stimulation were not performed.     ABNORMALITY - Continuous slow, generalized  IMPRESSION: This study is suggestive of mild diffuse encephalopathy. No seizures or epileptiform discharges were seen throughout the recording.  Viki Carrera Annabelle Harman

## 2023-11-28 NOTE — Evaluation (Signed)
 Occupational Therapy Evaluation Patient Details Name: Doris Pacheco MRN: [id number] DOB: 04/07/1941 Today's Date: 11/28/2023   History of Present Illness   Pt is an 83 y.o. female who presented to ED with c/o epiode of slurred speech, which had resolved on arrival. Admitted wtih dx of TIA. MRI negative. PMH:  GERD, HTN, TIA, severe arthritis and bed bound, hx of R femur fracture s/p ORIF (2017), R TKA, L THR, bilat rotator cuff repairs, anxiety     Clinical Impressions Patient admitted for the diagnosis above.  PTA she lives at home with her daughter, and family provides all needed assist.  Patient is very close to her baseline, but OT will follow in the acute setting to continue POC initiated by [facility] OT.  OT at home was working on EOB sitting and B UE exercise to increase independence with self feeding and light grooming tasks.  OT recommends home with family when cleared medically.       If plan is discharge home, recommend the following:   Assist for transportation     Functional Status Assessment   Patient has had a recent decline in their functional status and demonstrates the ability to make significant improvements in function in a reasonable and predictable amount of time.     Equipment Recommendations   None recommended by OT     Recommendations for Other Services         Precautions/Restrictions   Precautions Precautions: Fall Recall of Precautions/Restrictions: Impaired Restrictions Weight Bearing Restrictions Per Provider Order: No     Mobility Bed Mobility Overal bed mobility: Needs Assistance Bed Mobility: Supine to Sit, Sit to Supine Rolling: Max assist   Supine to sit: Max assist          Transfers                   General transfer comment: Michiel Sites lift transfers at baseline.      Balance Overall balance assessment: Needs assistance Sitting-balance support: Feet supported Sitting balance-Leahy Scale: Poor   Postural control:  Posterior lean                                 ADL either performed or assessed with clinical judgement   ADL Overall ADL's : At baseline                                             Vision Patient Visual Report: No change from baseline       Perception Perception: Not tested       Praxis Praxis: Not tested       Pertinent Vitals/Pain Pain Assessment Pain Assessment: Faces Faces Pain Scale: Hurts a little bit Pain Location: R knee Pain Descriptors / Indicators: Aching Pain Intervention(s): Monitored during session     Extremity/Trunk Assessment Upper Extremity Assessment Upper Extremity Assessment: Right hand dominant;RUE deficits/detail;LUE deficits/detail RUE Deficits / Details: decreased end range shoulder flexion RUE Sensation: WNL RUE Coordination: WNL LUE Deficits / Details: decreased end range shoulder flexion LUE Sensation: WNL LUE Coordination: WNL   Lower Extremity Assessment Lower Extremity Assessment: Defer to PT evaluation RLE Deficits / Details: fixed foot drop bilat LLE Deficits / Details: fixed foot drop bilat       Communication Communication Communication: No apparent difficulties   Cognition Arousal:  Alert Behavior During Therapy: WFL for tasks assessed/performed Cognition: History of cognitive impairments                               Following commands: Intact       Cueing  General Comments   Cueing Techniques: Verbal cues   VSS on RA   Exercises     Shoulder Instructions      Home Living Family/patient expects to be discharged to:: Private residence Living Arrangements: Children Available Help at Discharge: Family;Available 24 hours/day;Personal care attendant Type of Home: House Home Access: Ramped entrance     Home Layout: One level     Bathroom Shower/Tub: Chief Strategy Officer: Handicapped height Bathroom Accessibility: Yes   Home Equipment:  BSC/3in1;Shower seat - built Charity fundraiser (2 wheels);Hospital bed;Other (comment);Electric scooter;Wheelchair - manual          Prior Functioning/Environment Prior Level of Function : Needs assist             Mobility Comments: primarily bedbound. PCA uses hoyer lift to transfer to recliner. ADLs Comments: Assist with all ADLs including feeding. For toileting uses bed pan or briefs.    OT Problem List: Decreased range of motion;Impaired balance (sitting and/or standing)   OT Treatment/Interventions: Self-care/ADL training;Therapeutic activities;Balance training;Therapeutic exercise      OT Goals(Current goals can be found in the care plan section)   Acute Rehab OT Goals Patient Stated Goal: Return home OT Goal Formulation: With patient Time For Goal Achievement: 12/12/23 Potential to Achieve Goals: Good ADL Goals Pt/caregiver will Perform Home Exercise Program: Increased strength;Increased ROM;Both right and left upper extremity;With minimal assist Additional ADL Goal #1: Sit EOB for up to 10 min with supervision to increase independence with grooming and self feeding.   OT Frequency:  Min 1X/week    Co-evaluation              AM-PAC OT "6 Clicks" Daily Activity     Outcome Measure Help from another person eating meals?: A Lot Help from another person taking care of personal grooming?: A Lot Help from another person toileting, which includes using toliet, bedpan, or urinal?: Total Help from another person bathing (including washing, rinsing, drying)?: Total Help from another person to put on and taking off regular upper body clothing?: A Lot Help from another person to put on and taking off regular lower body clothing?: Total 6 Click Score: 9   End of Session Nurse Communication: Mobility status  Activity Tolerance: Patient tolerated treatment well Patient left: in bed;with call bell/phone within reach;with bed alarm set;with family/visitor present  OT  Visit Diagnosis: Muscle weakness (generalized) (M62.81)                Time: 1017-1040 OT Time Calculation (min): 23 min Charges:  OT General Charges $OT Visit: 1 Visit OT Evaluation $OT Eval Moderate Complexity: 1 Mod OT Treatments $Therapeutic Activity: 8-22 mins  11/28/2023  RP, OTR/L  Acute Rehabilitation Services  Office:  [phone]   Suzanna Obey 11/28/2023, 11:09 AM

## 2023-11-28 NOTE — Evaluation (Signed)
 Speech Language Pathology Evaluation Patient Details Name: Doris Pacheco MRN: [id number] DOB: Nov 02, 1940 Today's Date: 11/28/2023 Time: 4098-1191 SLP Time Calculation (min) (ACUTE ONLY): 20 min  Problem List:  Patient Active Problem List   Diagnosis Date Noted   TIA (transient ischemic attack) 11/27/2023   Chronic pancreatitis (HCC) 09/01/2020   Epigastric pain 09/01/2020   Family history of malignant neoplasm of gastrointestinal tract 09/01/2020   Imaging of gastrointestinal tract abnormal 09/01/2020   Left lower quadrant pain 09/01/2020   History of colonic polyps 09/01/2020   Slow transit constipation 09/01/2020   Weight decreased 09/01/2020   Hypothyroidism 03/02/2016   Hyperlipidemia 03/02/2016   Closed fracture of right distal femur (HCC) 02/03/2016   Abrasion, knee    Fracture of femur, distal, right, closed (HCC) 02/02/2016   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   Anemia 02/02/2016   Closed fracture of distal end of right femur, initial encounter (HCC) 02/02/2016   Essential hypertension    Fall    Osteoporosis 02/18/2015   Vertebral fracture, osteoporotic (HCC) 02/18/2015   Bilateral low back pain without sciatica 02/18/2015   Midline low back pain without sciatica 01/08/2015   Chest pain 07/30/2013   Hypertension    Anxiety    History of seasonal allergies    History of CVA (cerebrovascular accident)    GERD (gastroesophageal reflux disease)    Arthritis    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   Past Medical History:  Past Medical History:  Diagnosis Date   Anxiety    Arthritis    GERD (gastroesophageal reflux disease)    History of seasonal allergies    Hypertension    takes meds daily   Stroke (HCC)    tia   Past Surgical History:  Past Surgical History:  Procedure Laterality Date   ABDOMINAL HYSTERECTOMY     I & D KNEE WITH POLY EXCHANGE Right 10/26/2012   Procedure: IRRIGATION AND DEBRIDEMENT KNEE WITH POLY EXCHANGE;   Surgeon: Nestor Lewandowsky, MD;  Location: [facility] OR;  Service: Orthopedics;  Laterality: Right;  poly exchange   INTRAOCULAR LENS INSERTION     right eye   JOINT REPLACEMENT     left hip, right knee   ORIF FEMUR FRACTURE Right 02/03/2016   Procedure: OPEN REDUCTION INTERNAL FIXATION (ORIF) DISTAL FEMUR FRACTURE;  Surgeon: Gean Birchwood, MD;  Location: [facility] OR;  Service: Orthopedics;  Laterality: Right;   PATELLECTOMY Right 10/26/2012   Procedure: PATELLECTOMY;  Surgeon: Nestor Lewandowsky, MD;  Location: [facility] OR;  Service: Orthopedics;  Laterality: Right;   ROTATOR CUFF REPAIR     bilateral   TONSILLECTOMY     HPI:  Pt is an 83 y.o. female who presented to ED with c/o epiode of slurred speech, which had resolved on arrival. Admitted wtih dx of TIA. MRI negative. PMH:  GERD, HTN, TIA, severe arthritis and bed bound, hx of R femur fracture s/p ORIF (2017), R TKA, L THR, bilat rotator cuff repairs, anxiety.   Assessment / Plan / Recommendation Clinical Impression  Daughter present and stated pt is bedridden, mostly watches tv, lives with daughter and has a personal care attendant during the day. SLP feels pt is close to or at her baseline functioning in re: cognition and language.  Daughter stated she has suspected pt may have dementia for past several months as she has been having difficulty with cognition/memory/expression. Pt is not typically oriented to time. She knew  her age, city and hospital. Her comprehension  of language appeared within functional limits and was able to follow 3 step command. No expressive difficulties noted however daughter described pt had difficulty relaying information about her blinds this morning but was eventually able to communicate intent. She could not recall any of 4 words and was accurate when given choice cues. Discussed with daughter that pt's cognitive impairments may be related to underlying dementia however Speech therapy does not diagnose that and educated she may want to pursue  a neuropsychological consult when discharged and daughter in agreement. Also provided daughter with tips to facilitate her mom's cognition. ST will sign off at this time.    SLP Assessment  SLP Recommendation/Assessment:  (recommend neuropscyh as daughter concerned with dementia) SLP Visit Diagnosis: Cognitive communication deficit (N56.213)    Recommendations for follow up therapy are one component of a multi-disciplinary discharge planning process, led by the attending physician.  Recommendations may be updated based on patient status, additional functional criteria and insurance authorization.    Follow Up Recommendations  Other (comment) (daughter wants neuropsyfor cognitive dx for possible dementia)    Assistance Recommended at Discharge  Frequent or constant Supervision/Assistance  Functional Status Assessment Patient has not had a recent decline in their functional status  Frequency and Duration           SLP Evaluation Cognition  Overall Cognitive Status: History of cognitive impairments - at baseline (gradually worsening per daughter) Arousal/Alertness: Awake/alert Orientation Level: Oriented to person;Oriented to place;Disoriented to time (not oriented to time at baseline) Year:  (pt did not know) Attention: Sustained Sustained Attention: Appears intact Memory: Impaired (recalled 0/4 words, 2/4 with choice cue) Memory Impairment: Retrieval deficit;Storage deficit Awareness: Impaired Awareness Impairment: Intellectual impairment Problem Solving:  (suspect impaired - not specifically assessed) Safety/Judgment: Impaired       Comprehension  Auditory Comprehension Overall Auditory Comprehension: Appears within functional limits for tasks assessed Commands: Within Functional Limits (followed 3 step command) Visual Recognition/Discrimination Discrimination: Not tested Reading Comprehension Reading Status: Not tested    Expression Expression Primary Mode of Expression:  Verbal Verbal Expression Overall Verbal Expression: Appears within functional limits for tasks assessed Initiation: No impairment Level of Generative/Spontaneous Verbalization: Sentence Repetition: No impairment Naming: No impairment Pragmatics: No impairment Written Expression Written Expression: Not tested   Oral / Motor  Oral Motor/Sensory Function Overall Oral Motor/Sensory Function: Within functional limits Motor Speech Overall Motor Speech: Appears within functional limits for tasks assessed Respiration: Within functional limits Phonation: Normal Resonance: Within functional limits Articulation: Within functional limitis Intelligibility: Intelligible Motor Planning: Witnin functional limits            Royce Macadamia 11/28/2023, 11:43 AM

## 2023-11-28 NOTE — Evaluation (Signed)
 Physical Therapy Evaluation Patient Details Name: Doris Pacheco MRN: [id number] DOB: 09-11-1941 Today's Date: 11/28/2023  History of Present Illness  Pt is an 83 y.o. female who presented to ED with c/o epiode of slurred speech, which had resolved on arrival. Admitted wtih dx of TIA. MRI negative. PMH:  GERD, HTN, TIA, severe arthritis and bed bound, hx of R femur fracture s/p ORIF (2017), R TKA, L THR, bilat rotator cuff repairs, anxiety   Clinical Impression  PT eval complete. At baseline, pt is primarily bedbound. PCA uses hoyer lift for occasional transfers to recliner. On eval, she required total assist rolling R/L. Pt reports all symptoms have resolved. No change in mobility or strength noted. No further skilled PT intervention indicated. PT signing off.         If plan is discharge home, recommend the following: A lot of help with bathing/dressing/bathroom;A lot of help with walking and/or transfers   Can travel by private vehicle        Equipment Recommendations None recommended by PT  Recommendations for Other Services       Functional Status Assessment Patient has not had a recent decline in their functional status     Precautions / Restrictions Precautions Precautions: Fall Recall of Precautions/Restrictions: Intact      Mobility  Bed Mobility Overal bed mobility: Needs Assistance Bed Mobility: Rolling Rolling: Total assist         General bed mobility comments: total assist rolling R/L.    Transfers                   General transfer comment: Michiel Sites lift transfers at baseline.    Ambulation/Gait                  Stairs            Wheelchair Mobility     Tilt Bed    Modified Rankin (Stroke Patients Only) Modified Rankin (Stroke Patients Only) Pre-Morbid Rankin Score: Severe disability Modified Rankin: Severe disability     Balance                                             Pertinent Vitals/Pain  Pain Assessment Pain Assessment: Faces Faces Pain Scale: Hurts little more Pain Location: bilat shoulders with ROM Pain Descriptors / Indicators: Grimacing, Guarding Pain Intervention(s): Limited activity within patient's tolerance    Home Living Family/patient expects to be discharged to:: Private residence Living Arrangements: Children (daughter and granddaughter) Available Help at Discharge: Family;Available 24 hours/day;Personal care attendant Type of Home: House Home Access: Ramped entrance       Home Layout: One level Home Equipment: BSC/3in1;Shower seat - built Charity fundraiser (2 wheels);Hospital bed;Other (comment) (hoyer lift)      Prior Function Prior Level of Function : Needs assist             Mobility Comments: primarily bedbound. PCA uses hoyer lift to transfer to recliner. ADLs Comments: Assist with all ADLs including feeding. For toileting uses bed pan or briefs.     Extremity/Trunk Assessment   Upper Extremity Assessment Upper Extremity Assessment: Defer to OT evaluation    Lower Extremity Assessment Lower Extremity Assessment: Generalized weakness;RLE deficits/detail;LLE deficits/detail RLE Deficits / Details: fixed foot drop bilat LLE Deficits / Details: fixed foot drop bilat       Communication   Communication Communication:  No apparent difficulties    Cognition Arousal: Alert Behavior During Therapy: WFL for tasks assessed/performed   PT - Cognitive impairments: No apparent impairments                         Following commands: Intact       Cueing       General Comments      Exercises     Assessment/Plan    PT Assessment Patient does not need any further PT services  PT Problem List         PT Treatment Interventions      PT Goals (Current goals can be found in the Care Plan section)  Acute Rehab PT Goals Patient Stated Goal: home PT Goal Formulation: All assessment and education complete, DC therapy     Frequency       Co-evaluation               AM-PAC PT "6 Clicks" Mobility  Outcome Measure Help needed turning from your back to your side while in a flat bed without using bedrails?: Total Help needed moving from lying on your back to sitting on the side of a flat bed without using bedrails?: Total Help needed moving to and from a bed to a chair (including a wheelchair)?: Total Help needed standing up from a chair using your arms (e.g., wheelchair or bedside chair)?: Total Help needed to walk in hospital room?: Total Help needed climbing 3-5 steps with a railing? : Total 6 Click Score: 6    End of Session   Activity Tolerance: Patient tolerated treatment well Patient left: in bed;with bed alarm set;with call bell/phone within reach Nurse Communication: Mobility status;Need for lift equipment PT Visit Diagnosis: Other abnormalities of gait and mobility (R26.89)    Time: 1610-9604 PT Time Calculation (min) (ACUTE ONLY): 13 min   Charges:   PT Evaluation $PT Eval Low Complexity: 1 Low   PT General Charges $$ ACUTE PT VISIT: 1 Visit         Ferd Glassing., PT  Office # [phone]   Ilda Foil 11/28/2023, 8:21 AM

## 2023-11-28 NOTE — Consult Note (Signed)
 NEUROLOGY CONSULT NOTE   Date of service: November 28, 2023 Patient Name: Doris Pacheco MRN:  [id number] DOB:  09/11/1941 Chief Complaint: "concern for TIA" Requesting Provider: Lanae Boast, MD  History of Present Illness  Doris Pacheco is a 83 y.o. female with hx of GERD, hypertension, TIA, severe arthritis and bed bound, hx of R femur fracture who was brought in for an episode of slurred speech, word finding difficulty that per notes, lasted about a minute. Althou, patient is unclear on how long it lasted for. Case was discussed with day shift neurology team and felt to be a TIA. Patient had CT Head w/o contrast and was negative for ICH. She had MRI Brain w/o contrast which was negative for any acute intracranial abnormalities.  I am unable to get in touch with her daughter who brought her in.  Patient reports pain in her R shoulder from rotator cuff tear and in her R leg from arthritis and reports she is bed bound.  LKW: unclear Modified rankin score: 5-Severe disability-bedridden, incontinent, needs constant attention IV Thrombolysis: not offered, deficits resolved. EVT: not offered, low suspicion for LVO   NIHSS components Score: Comment  1a Level of Conscious 0[x]  1[]  2[]  3[]      1b LOC Questions 0[]  1[x]  2[]     Thinks its nov of 2024  1c LOC Commands 0[x]  1[]  2[]       2 Best Gaze 0[x]  1[]  2[]       3 Visual 0[x]  1[]  2[]  3[]      4 Facial Palsy 0[x]  1[]  2[]  3[]      5a Motor Arm - left 0[x]  1[]  2[]  3[]  4[]  UN[]    5b Motor Arm - Right 0[]  1[]  2[]  3[x]  4[]  UN[]    6a Motor Leg - Left 0[]  1[]  2[]  3[x]  4[]  UN[]    6b Motor Leg - Right 0[]  1[]  2[]  3[x]  4[]  UN[]    7 Limb Ataxia 0[x]  1[]  2[]  3[]  UN[]     8 Sensory 0[x]  1[]  2[]  UN[]      9 Best Language 0[x]  1[]  2[]  3[]      10 Dysarthria 0[x]  1[]  2[]  UN[]      11 Extinct. and Inattention 0[x]  1[]  2[]       TOTAL: 10      ROS  Comprehensive ROS performed and pertinent positives documented in HPI   Past History   Past Medical History:   Diagnosis Date   Anxiety    Arthritis    GERD (gastroesophageal reflux disease)    History of seasonal allergies    Hypertension    takes meds daily   Stroke [facility])    tia    Past Surgical History:  Procedure Laterality Date   ABDOMINAL HYSTERECTOMY     I & D KNEE WITH POLY EXCHANGE Right 10/26/2012   Procedure: IRRIGATION AND DEBRIDEMENT KNEE WITH POLY EXCHANGE;  Surgeon: Nestor Lewandowsky, MD;  Location: [facility] OR;  Service: Orthopedics;  Laterality: Right;  poly exchange   INTRAOCULAR LENS INSERTION     right eye   JOINT REPLACEMENT     left hip, right knee   ORIF FEMUR FRACTURE Right 02/03/2016   Procedure: OPEN REDUCTION INTERNAL FIXATION (ORIF) DISTAL FEMUR FRACTURE;  Surgeon: Gean Birchwood, MD;  Location: [facility] OR;  Service: Orthopedics;  Laterality: Right;   PATELLECTOMY Right 10/26/2012   Procedure: PATELLECTOMY;  Surgeon: Nestor Lewandowsky, MD;  Location: [facility] OR;  Service: Orthopedics;  Laterality: Right;   ROTATOR CUFF REPAIR     bilateral   TONSILLECTOMY  Family History: Family History  Problem Relation Age of Onset   Cancer Mother        COLON   Diabetes Sister    Stroke Sister    Hypertension Father    Pneumonia Father     Social History  reports that she has quit smoking. She has never used smokeless tobacco. She reports that she does not drink alcohol and does not use drugs.  Allergies  Allergen Reactions   Forteo [Parathyroid Hormone (Recomb)] Shortness Of Breath   Latex Other (See Comments)    bruising   Tape Rash    Paper tape OK    Medications   Current Facility-Administered Medications:     stroke: early stages of recovery book, , Does not apply, Once, Kc, Ramesh, MD   acetaminophen (TYLENOL) tablet 650 mg, 650 mg, Oral, Q4H PRN, 650 mg at 11/27/23 2111 **OR** acetaminophen (TYLENOL) 160 MG/5ML solution 650 mg, 650 mg, Per Tube, Q4H PRN **OR** acetaminophen (TYLENOL) suppository 650 mg, 650 mg, Rectal, Q4H PRN, Kc, Ramesh, MD   enoxaparin (LOVENOX)  injection 40 mg, 40 mg, Subcutaneous, Q24H, Kc, Ramesh, MD, 40 mg at 11/27/23 2112  Current Outpatient Medications:    acetaminophen (TYLENOL) 325 MG tablet, Take 2 tablets (650 mg total) by mouth every 6 (six) hours as needed for moderate pain, fever or headache., Disp: 30 tablet, Rfl: 0   albuterol (VENTOLIN HFA) 108 (90 Base) MCG/ACT inhaler, Inhale 1-2 puffs into the lungs every 6 (six) hours as needed for wheezing or shortness of breath., Disp: 1 each, Rfl: 0   atorvastatin (LIPITOR) 10 MG tablet, Take 10 mg by mouth daily., Disp: , Rfl:    calcium carbonate (OSCAL) 1500 (600 Ca) MG TABS tablet, Take 1,500 mg by mouth daily with breakfast. , Disp: , Rfl:    cetirizine (ZYRTEC) 10 MG tablet, Take 10 mg by mouth daily as needed for allergies., Disp: , Rfl:    Cholecalciferol (VITAMIN D3) 25 MCG (1000 UT) CAPS, Take 1,000 Units by mouth daily., Disp: , Rfl:    diclofenac Sodium (VOLTAREN) 1 % GEL, Apply 2 g topically daily as needed (pain)., Disp: , Rfl:    docusate sodium (COLACE) 100 MG capsule, 100 mg daily as needed for mild constipation., Disp: , Rfl:    famotidine (PEPCID) 20 MG tablet, Take 20 mg by mouth daily., Disp: , Rfl:    fluticasone (FLONASE) 50 MCG/ACT nasal spray, 1 spray daily as needed for allergies., Disp: , Rfl:    guaiFENesin (ROBITUSSIN) 100 MG/5ML liquid, Take 5-10 mLs (100-200 mg total) by mouth every 4 (four) hours as needed for cough or to loosen phlegm., Disp: 60 mL, Rfl: 0   ibuprofen (ADVIL,MOTRIN) 800 MG tablet, Take 800 mg by mouth every 8 (eight) hours as needed for mild pain., Disp: , Rfl:    meloxicam (MOBIC) 15 MG tablet, Take 15 mg by mouth daily as needed for pain., Disp: , Rfl:    methenamine (HIPREX) 1 g tablet, Take 1 g by mouth 2 (two) times daily with a meal., Disp: , Rfl:    telmisartan (MICARDIS) 80 MG tablet, Take 80 mg by mouth daily as needed (blood pressur)., Disp: , Rfl:    traMADol (ULTRAM) 50 MG tablet, Take 50 mg by mouth daily as needed.,  Disp: , Rfl:    triamterene-hydrochlorothiazide (MAXZIDE-25) 37.5-25 MG tablet, Take 1 tablet by mouth daily., Disp: , Rfl:    escitalopram (LEXAPRO) 5 MG tablet, Take 5 mg by mouth daily. (Patient  not taking: Reported on 12/07/23), Disp: , Rfl:   Vitals   Vitals:   12/07/2023 1536 12/07/2023 2230 Dec 07, 2023 2245 11/28/23 0015  BP: (!) 92/57 112/67 98/76 (!) 129/55  Pulse: 88  85 80  Resp: 17 15 13 14   Temp: 98.5 F (36.9 C)     TempSrc: Oral     SpO2: 99%  100% 100%    There is no height or weight on file to calculate BMI.  Physical Exam   General: Laying comfortably in bed; in no acute distress.  HENT: Normal oropharynx and mucosa. Normal external appearance of ears and nose.  Neck: Supple, no pain or tenderness  CV: No JVD. No peripheral edema.  Pulmonary: Symmetric Chest rise. Normal respiratory effort.  Abdomen: Soft to touch, non-tender.  Ext: No cyanosis, contractures noted around R ankle. Scar tissue on R knee from prior surgery. Skin: No rash. Normal palpation of skin.   Musculoskeletal: Normal digits and nails by inspection. No clubbing.   Neurologic Examination  Mental status/Cognition: Alert, oriented to self, place, but thinks its nov of 2024, good attention.  Speech/language: Fluent, comprehension intact, object naming intact, repetition intact.  Cranial nerves:   CN II Pupils equal and reactive to light, no VF deficits    CN III,IV,VI EOM intact, no gaze preference or deviation, no nystagmus    CN V normal sensation in V1, V2, and V3 segments bilaterally    CN VII no asymmetry, no nasolabial fold flattening    CN VIII normal hearing to speech    CN IX & X normal palatal elevation, no uvular deviation    CN XI 5/5 head turn and 5/5 shoulder shrug bilaterally    CN XII midline tongue protrusion    Motor:  Muscle bulk: poor, tone normal Mvmt Root Nerve  Muscle Right Left Comments  SA C5/6 Ax Deltoid 2 4+ R shoulder pain limits movement  EF C5/6 Mc Biceps 5 5    EE C6/7/8 Rad Triceps 5 5   WF C6/7 Med FCR     WE C7/8 PIN ECU     F Ab C8/T1 U ADM/FDI 5 5   HF L1/2/3 Fem Illopsoas 4 4+   KE L2/3/4 Fem Quad   Deferred due to pain  DF L4/5 D Peron Tib Ant     PF S1/2 Tibial Grc/Sol      Sensation:  Light touch Intact throughout   Pin prick    Temperature    Vibration   Proprioception    Coordination/Complex Motor:  - Finger to Nose intact BL - Heel to shin unable to do, bed bound at baseline - Rapid alternating movement are slowed - Gait: deferred.  Labs/Imaging/Neurodiagnostic studies   CBC:  Recent Labs  Lab 12/07/2023 1146 2023-12-07 1151  WBC 7.5  --   NEUTROABS 3.7  --   HGB 12.1 13.3  HCT 39.5 39.0  MCV 84.4  --   PLT 321  --    Basic Metabolic Panel:  Lab Results  Component Value Date   NA 137 Dec 07, 2023   K 3.6 2023-12-07   CO2 22 07-Dec-2023   GLUCOSE 97 12-07-2023   BUN 41 (H) Dec 07, 2023   CREATININE 1.10 (H) 2023/12/07   CALCIUM 10.4 (H) 12/07/2023   GFRNONAA 53 (L) 12-07-2023   GFRAA >60 07/08/2016   Lipid Panel: No results found for: "LDLCALC" HgbA1c:  Lab Results  Component Value Date   HGBA1C 5.4 07-Dec-2023   Urine Drug Screen: No results found for: "  LABOPIA", "COCAINSCRNUR", "LABBENZ", "AMPHETMU", "THCU", "LABBARB"  Alcohol Level     Component Value Date/Time   ETH <10 11/27/2023 1146   INR  Lab Results  Component Value Date   INR 1.1 11/27/2023   APTT  Lab Results  Component Value Date   APTT 35 11/27/2023   AED levels: No results found for: "PHENYTOIN", "ZONISAMIDE", "LAMOTRIGINE", "LEVETIRACETA"  CT Head without contrast(Personally reviewed): CTH was negative for a large hypodensity concerning for a large territory infarct or hyperdensity concerning for an ICH. Noted dilation of ventricles.  MR Angio head without contrast and Carotid Duplex BL(Personally reviewed): pending  MRI Brain(Personally reviewed): No acute abnormalities.  Neurodiagnostics rEEG:  pending  ASSESSMENT    JERSEY ESPINOZA is a 83 y.o. female with hx of GERD, hypertension, TIA, severe arthritis and bed bound, hx of R femur fracture who was brought in for an episode of slurred speech, word finding difficulties and confusion. MRI brain with no acute abnormalities. Labs suggestive of AKI.  She has some recollection of the episode, thou a poor historian. I am unable to get in touch with her daughter to get colateral and get more details. However, H&P denotes tha the episode lasted about a minute. This would be very atypical of TIA. Also less likely is for her   AKI and potential dehydration/hypotension maybe a better alternate explanation and obtaining collateral history about po fluids intake would be helpful in diagnosing.  It also seems like she presented back in sept of 2024 with a similar episode. However, that episode involved patient passing out and slumping over while using the bedside commode and was then noted to have R sided weakness and slurred speech with some trouble communicating. The episode was felt to be a watershed TIA symptoms secondary to transient hypotension.  RECOMMENDATIONS  - rEEG - MR angio head and MR angio neck to evaluate for carotid insufficiency. ______________________________________________________________________    Welton Flakes, MD [facility]

## 2024-01-18 DEATH — deceased

## 2024-03-25 ENCOUNTER — Ambulatory Visit: Admitting: Neurology
